# Patient Record
Sex: Male | Born: 1965 | Race: Black or African American | Hispanic: No | Marital: Married | Smoking: Never smoker
Health system: Southern US, Community
[De-identification: ages and names within clinical notes are randomized; demographics above are authoritative.]

## PROBLEM LIST (undated history)

## (undated) DIAGNOSIS — I10 Essential (primary) hypertension: Secondary | ICD-10-CM

## (undated) DIAGNOSIS — N521 Erectile dysfunction due to diseases classified elsewhere: Secondary | ICD-10-CM

## (undated) DIAGNOSIS — Z8249 Family history of ischemic heart disease and other diseases of the circulatory system: Secondary | ICD-10-CM

## (undated) DIAGNOSIS — F40243 Fear of flying: Secondary | ICD-10-CM

## (undated) DIAGNOSIS — E11319 Type 2 diabetes mellitus with unspecified diabetic retinopathy without macular edema: Secondary | ICD-10-CM

## (undated) DIAGNOSIS — E119 Type 2 diabetes mellitus without complications: Secondary | ICD-10-CM

## (undated) DIAGNOSIS — G4730 Sleep apnea, unspecified: Secondary | ICD-10-CM

## (undated) DIAGNOSIS — I2510 Atherosclerotic heart disease of native coronary artery without angina pectoris: Secondary | ICD-10-CM

## (undated) DIAGNOSIS — E1169 Type 2 diabetes mellitus with other specified complication: Secondary | ICD-10-CM

## (undated) DIAGNOSIS — E785 Hyperlipidemia, unspecified: Secondary | ICD-10-CM

## (undated) DIAGNOSIS — E1165 Type 2 diabetes mellitus with hyperglycemia: Secondary | ICD-10-CM

## (undated) DIAGNOSIS — E1151 Type 2 diabetes mellitus with diabetic peripheral angiopathy without gangrene: Secondary | ICD-10-CM

## (undated) DIAGNOSIS — E291 Testicular hypofunction: Secondary | ICD-10-CM

## (undated) DIAGNOSIS — N529 Male erectile dysfunction, unspecified: Secondary | ICD-10-CM

## (undated) DIAGNOSIS — M6208 Separation of muscle (nontraumatic), other site: Secondary | ICD-10-CM

## (undated) HISTORY — DX: Sleep apnea, unspecified: G47.30

## (undated) HISTORY — DX: Type 2 diabetes mellitus with diabetic peripheral angiopathy without gangrene: E11.51

## (undated) HISTORY — DX: Morbid (severe) obesity due to excess calories: E66.01

## (undated) HISTORY — DX: Atherosclerotic heart disease of native coronary artery without angina pectoris: I25.10

## (undated) HISTORY — DX: Separation of muscle (nontraumatic), other site: M62.08

## (undated) HISTORY — DX: Testicular hypofunction: E29.1

## (undated) HISTORY — DX: Essential (primary) hypertension: I10

## (undated) HISTORY — DX: Male erectile dysfunction, unspecified: N52.9

## (undated) HISTORY — DX: Type 2 diabetes mellitus with hyperglycemia: E11.65

## (undated) HISTORY — DX: Type 2 diabetes mellitus with unspecified diabetic retinopathy without macular edema: E11.319

## (undated) HISTORY — DX: Type 2 diabetes mellitus without complications: E11.9

## (undated) HISTORY — DX: Fear of flying: F40.243

## (undated) HISTORY — DX: Family history of ischemic heart disease and other diseases of the circulatory system: Z82.49

## (undated) HISTORY — DX: Type 2 diabetes mellitus with other specified complication: E11.69

## (undated) HISTORY — DX: Erectile dysfunction due to diseases classified elsewhere: N52.1

## (undated) HISTORY — DX: Hyperlipidemia, unspecified: E78.5

---

## 2011-12-28 HISTORY — PX: COLONOSCOPY: SHX174

## 2015-02-20 DIAGNOSIS — E1169 Type 2 diabetes mellitus with other specified complication: Secondary | ICD-10-CM | POA: Insufficient documentation

## 2015-02-20 DIAGNOSIS — N529 Male erectile dysfunction, unspecified: Secondary | ICD-10-CM | POA: Insufficient documentation

## 2015-02-20 DIAGNOSIS — E66813 Obesity, class 3: Secondary | ICD-10-CM

## 2015-02-20 DIAGNOSIS — Z6841 Body Mass Index (BMI) 40.0 and over, adult: Secondary | ICD-10-CM

## 2015-02-20 DIAGNOSIS — N521 Erectile dysfunction due to diseases classified elsewhere: Secondary | ICD-10-CM

## 2015-02-20 HISTORY — DX: Obesity, class 3: E66.813

## 2015-02-20 HISTORY — DX: Type 2 diabetes mellitus with other specified complication: N52.1

## 2015-02-20 HISTORY — DX: Morbid (severe) obesity due to excess calories: E66.01

## 2015-02-20 HISTORY — DX: Type 2 diabetes mellitus with other specified complication: E11.69

## 2015-03-12 ENCOUNTER — Encounter: Payer: Self-pay | Admitting: Podiatry

## 2015-03-12 ENCOUNTER — Ambulatory Visit (INDEPENDENT_AMBULATORY_CARE_PROVIDER_SITE_OTHER): Payer: BLUE CROSS/BLUE SHIELD | Admitting: Podiatry

## 2015-03-12 ENCOUNTER — Ambulatory Visit (INDEPENDENT_AMBULATORY_CARE_PROVIDER_SITE_OTHER): Payer: BLUE CROSS/BLUE SHIELD

## 2015-03-12 VITALS — BP 146/90 | HR 91 | Resp 16 | Ht 70.0 in | Wt 285.0 lb

## 2015-03-12 DIAGNOSIS — L600 Ingrowing nail: Secondary | ICD-10-CM | POA: Diagnosis not present

## 2015-03-12 DIAGNOSIS — M722 Plantar fascial fibromatosis: Secondary | ICD-10-CM

## 2015-03-12 DIAGNOSIS — E119 Type 2 diabetes mellitus without complications: Secondary | ICD-10-CM

## 2015-03-12 MED ORDER — NEOMYCIN-POLYMYXIN-HC 3.5-10000-1 OT SOLN: OTIC | Status: DC

## 2015-03-12 MED ORDER — MELOXICAM 15 MG PO TABS: 15.0000 mg | ORAL_TABLET | Freq: Every day | ORAL | Status: DC

## 2015-03-12 NOTE — Patient Instructions (Addendum)
Plantar Fasciitis (Heel Spur Syndrome) with Rehab The plantar fascia is a fibrous, ligament-like, soft-tissue structure that spans the bottom of the foot. Plantar fasciitis is a condition that causes pain in the foot due to inflammation of the tissue. SYMPTOMS   Pain and tenderness on the underneath side of the foot.  Pain that worsens with standing or walking. CAUSES  Plantar fasciitis is caused by irritation and injury to the plantar fascia on the underneath side of the foot. Common mechanisms of injury include:  Direct trauma to bottom of the foot.  Damage to a small nerve that runs under the foot where the main fascia attaches to the heel bone.  Stress placed on the plantar fascia due to bone spurs. RISK INCREASES WITH:   Activities that place stress on the plantar fascia (running, jumping, pivoting, or cutting).  Poor strength and flexibility.  Improperly fitted shoes.  Tight calf muscles.  Flat feet.  Failure to warm-up properly before activity.  Obesity. PREVENTION  Warm up and stretch properly before activity.  Allow for adequate recovery between workouts.  Maintain physical fitness:  Strength, flexibility, and endurance.  Cardiovascular fitness.  Maintain a health body weight.  Avoid stress on the plantar fascia.  Wear properly fitted shoes, including arch supports for individuals who have flat feet.  PROGNOSIS  If treated properly, then the symptoms of plantar fasciitis usually resolve without surgery. However, occasionally surgery is necessary.  RELATED COMPLICATIONS   Recurrent symptoms that may result in a chronic condition.  Problems of the lower back that are caused by compensating for the injury, such as limping.  Pain or weakness of the foot during push-off following surgery.  Chronic inflammation, scarring, and partial or complete fascia tear, occurring more often from repeated injections.  TREATMENT  Treatment initially involves the  use of ice and medication to help reduce pain and inflammation. The use of strengthening and stretching exercises may help reduce pain with activity, especially stretches of the Achilles tendon. These exercises may be performed at home or with a therapist. Your caregiver may recommend that you use heel cups of arch supports to help reduce stress on the plantar fascia. Occasionally, corticosteroid injections are given to reduce inflammation. If symptoms persist for greater than 6 months despite non-surgical (conservative), then surgery may be recommended.   MEDICATION   If pain medication is necessary, then nonsteroidal anti-inflammatory medications, such as aspirin and ibuprofen, or other minor pain relievers, such as acetaminophen, are often recommended.  Do not take pain medication within 7 days before surgery.  Prescription pain relievers may be given if deemed necessary by your caregiver. Use only as directed and only as much as you need.  Corticosteroid injections may be given by your caregiver. These injections should be reserved for the most serious cases, because they may only be given a certain number of times.  HEAT AND COLD  Cold treatment (icing) relieves pain and reduces inflammation. Cold treatment should be applied for 10 to 15 minutes every 2 to 3 hours for inflammation and pain and immediately after any activity that aggravates your symptoms. Use ice packs or massage the area with a piece of ice (ice massage).  Heat treatment may be used prior to performing the stretching and strengthening activities prescribed by your caregiver, physical therapist, or athletic trainer. Use a heat pack or soak the injury in warm water.  SEEK IMMEDIATE MEDICAL CARE IF:  Treatment seems to offer no benefit, or the condition worsens.  Any medications   produce adverse side effects.  EXERCISES- RANGE OF MOTION (ROM) AND STRETCHING EXERCISES - Plantar Fasciitis (Heel Spur Syndrome) These exercises  may help you when beginning to rehabilitate your injury. Your symptoms may resolve with or without further involvement from your physician, physical therapist or athletic trainer. While completing these exercises, remember:   Restoring tissue flexibility helps normal motion to return to the joints. This allows healthier, less painful movement and activity.  An effective stretch should be held for at least 30 seconds.  A stretch should never be painful. You should only feel a gentle lengthening or release in the stretched tissue.  RANGE OF MOTION - Toe Extension, Flexion  Sit with your right / left leg crossed over your opposite knee.  Grasp your toes and gently pull them back toward the top of your foot. You should feel a stretch on the bottom of your toes and/or foot.  Hold this stretch for 10 seconds.  Now, gently pull your toes toward the bottom of your foot. You should feel a stretch on the top of your toes and or foot.  Hold this stretch for 10 seconds. Repeat  times. Complete this stretch 3 times per day.   RANGE OF MOTION - Ankle Dorsiflexion, Active Assisted  Remove shoes and sit on a chair that is preferably not on a carpeted surface.  Place right / left foot under knee. Extend your opposite leg for support.  Keeping your heel down, slide your right / left foot back toward the chair until you feel a stretch at your ankle or calf. If you do not feel a stretch, slide your bottom forward to the edge of the chair, while still keeping your heel down.  Hold this stretch for 10 seconds. Repeat 3 times. Complete this stretch 2 times per day.   STRETCH  Gastroc, Standing  Place hands on wall.  Extend right / left leg, keeping the front knee somewhat bent.  Slightly point your toes inward on your back foot.  Keeping your right / left heel on the floor and your knee straight, shift your weight toward the wall, not allowing your back to arch.  You should feel a gentle stretch  in the right / left calf. Hold this position for 10 seconds. Repeat 3 times. Complete this stretch 2 times per day.  STRETCH  Soleus, Standing  Place hands on wall.  Extend right / left leg, keeping the other knee somewhat bent.  Slightly point your toes inward on your back foot.  Keep your right / left heel on the floor, bend your back knee, and slightly shift your weight over the back leg so that you feel a gentle stretch deep in your back calf.  Hold this position for 10 seconds. Repeat 3 times. Complete this stretch 2 times per day.  STRETCH  Gastrocsoleus, Standing  Note: This exercise can place a lot of stress on your foot and ankle. Please complete this exercise only if specifically instructed by your caregiver.   Place the ball of your right / left foot on a step, keeping your other foot firmly on the same step.  Hold on to the wall or a rail for balance.  Slowly lift your other foot, allowing your body weight to press your heel down over the edge of the step.  You should feel a stretch in your right / left calf.  Hold this position for 10 seconds.  Repeat this exercise with a slight bend in your right /   left knee. Repeat 3 times. Complete this stretch 2 times per day.   STRENGTHENING EXERCISES - Plantar Fasciitis (Heel Spur Syndrome)  These exercises may help you when beginning to rehabilitate your injury. They may resolve your symptoms with or without further involvement from your physician, physical therapist or athletic trainer. While completing these exercises, remember:   Muscles can gain both the endurance and the strength needed for everyday activities through controlled exercises.  Complete these exercises as instructed by your physician, physical therapist or athletic trainer. Progress the resistance and repetitions only as guided.  STRENGTH - Towel Curls  Sit in a chair positioned on a non-carpeted surface.  Place your foot on a towel, keeping your heel  on the floor.  Pull the towel toward your heel by only curling your toes. Keep your heel on the floor. Repeat 3 times. Complete this exercise 2 times per day.  STRENGTH - Ankle Inversion  Secure one end of a rubber exercise band/tubing to a fixed object (table, pole). Loop the other end around your foot just before your toes.  Place your fists between your knees. This will focus your strengthening at your ankle.  Slowly, pull your big toe up and in, making sure the band/tubing is positioned to resist the entire motion.  Hold this position for 10 seconds.  Have your muscles resist the band/tubing as it slowly pulls your foot back to the starting position. Repeat 3 times. Complete this exercises 2 times per day.  Document Released: 12/13/2005 Document Revised: 03/06/2012 Document Reviewed: 03/27/2009 [facility] Patient Information 2014 [place], [place]. Diabetes and Foot Care Diabetes may cause you to have problems because of poor blood supply (circulation) to your feet and legs. This may cause the skin on your feet to become thinner, break easier, and heal more slowly. Your skin may become dry, and the skin may peel and crack. You may also have nerve damage in your legs and feet causing decreased feeling in them. You may not notice minor injuries to your feet that could lead to infections or more serious problems. Taking care of your feet is one of the most important things you can do for yourself.  HOME CARE INSTRUCTIONS  Wear shoes at all times, even in the house. Do not go barefoot. Bare feet are easily injured.  Check your feet daily for blisters, cuts, and redness. If you cannot see the bottom of your feet, use a mirror or ask someone for help.  Wash your feet with warm water (do not use hot water) and mild soap. Then pat your feet and the areas between your toes until they are completely dry. Do not soak your feet as this can dry your skin.  Apply a moisturizing lotion or petroleum  jelly (that does not contain alcohol and is unscented) to the skin on your feet and to dry, brittle toenails. Do not apply lotion between your toes.  Trim your toenails straight across. Do not dig under them or around the cuticle. File the edges of your nails with an emery board or nail file.  Do not cut corns or calluses or try to remove them with medicine.  Wear clean socks or stockings every day. Make sure they are not too tight. Do not wear knee-high stockings since they may decrease blood flow to your legs.  Wear shoes that fit properly and have enough cushioning. To break in new shoes, wear them for just a few hours a day. This prevents you from injuring your  feet. Always look in your shoes before you put them on to be sure there are no objects inside.  Do not cross your legs. This may decrease the blood flow to your feet.  If you find a minor scrape, cut, or break in the skin on your feet, keep it and the skin around it clean and dry. These areas may be cleansed with mild soap and water. Do not cleanse the area with peroxide, alcohol, or iodine.  When you remove an adhesive bandage, be sure not to damage the skin around it.  If you have a wound, look at it several times a day to make sure it is healing.  Do not use heating pads or hot water bottles. They may burn your skin. If you have lost feeling in your feet or legs, you may not know it is happening until it is too late.  Make sure your health care provider performs a complete foot exam at least annually or more often if you have foot problems. Report any cuts, sores, or bruises to your health care provider immediately. SEEK MEDICAL CARE IF:   You have an injury that is not healing.  You have cuts or breaks in the skin.  You have an ingrown nail.  You notice redness on your legs or feet.  You feel burning or tingling in your legs or feet.  You have pain or cramps in your legs and feet.  Your legs or feet are numb.  Your  feet always feel cold. SEEK IMMEDIATE MEDICAL CARE IF:   There is increasing redness, swelling, or pain in or around a wound.  There is a red line that goes up your leg.  Pus is coming from a wound.  You develop a fever or as directed by your health care provider.  You notice a bad smell coming from an ulcer or wound. Document Released: 12/10/2000 Document Revised: 08/15/2013 Document Reviewed: 05/22/2013 [facility] Patient Information 2015 [place], [place]. This information is not intended to replace advice given to you by your health care provider. Make sure you discuss any questions you have with your health care provider.  Betadine Soak Instructions  Purchase an 8 oz. bottle of BETADINE solution (Povidone)  THE DAY AFTER THE PROCEDURE  Place 1 tablespoon of betadine solution in a quart of warm tap water.  Submerge your foot or feet with outer bandage intact for the initial soak; this will allow the bandage to become moist and wet for easy lift off.  Once you remove your bandage, continue to soak in the solution for 20 minutes.  This soak should be done twice a day.  Next, remove your foot or feet from solution, blot dry the affected area and cover.  You may use a band aid large enough to cover the area or use gauze and tape.  Apply other medications to the area as directed by the doctor such as cortisporin otic solution (ear drops) or neosporin.  IF YOUR SKIN BECOMES IRRITATED WHILE USING THESE INSTRUCTIONS, IT IS OKAY TO SWITCH TO EPSOM SALTS AND WATER OR WHITE VINEGAR AND WATER.   [place] Instructions-Post Nail Surgery  You have had your ingrown toenail and root treated with a chemical.  This chemical causes a burn that will drain and ooze like a blister.  This can drain for 6-8 weeks or longer.  It is important to keep this area clean, covered, and follow the soaking instructions dispensed at the time of your surgery.  This area will eventually dry and form a scab.  Once the  scab forms you no longer need to soak or apply a dressing.  If at any time you experience an increase in pain, redness, swelling, or drainage, you should contact the office as soon as possible.

## 2015-03-12 NOTE — Progress Notes (Signed)
   Subjective:    Patient ID: Clarence Black, male    DOB: 10-11-1966, 49 y.o.   MRN: [id number]  HPI right heel pain , been dealing with it for about a month , pain in the bottom of the heel and goes up the arch . Going up the leg. I am diabetic , last a1c 7.0     Review of Systems  Endocrine:       Diabetes   All other systems reviewed and are negative.      Objective:   Physical Exam: I have reviewed his past medical history medications allergies surgery social history and review of systems. Pulses are strongly palpable bilateral. Neurologic sensorium is intact per Semmes-Weinstein and vibratory sensations. Deep tendon reflexes are intact bilateral and muscle strength +5 over 5 dorsiflexion plantar flexors and inverters and everters all intrinsic musculature is intact. Orthopedic evaluation Mr. is all joints distal to the ankle level range of motion without crepitation. That he does have pain on palpation medial calcaneal tubercle of his right heel. Radiographs confirm soft tissue increase in density at the plantar fascial cranial insertion site right heel. Otherwise he has a sharp incurvated nail margin along the tibial and fibular borders of the hallux nail left. This hallux nail does demonstrate some signs of dermatophytes as it does a nail dystrophy.        Assessment & Plan:  Assessment: Plantar fasciitis right heel. Ingrown nail paronychia abscess hallux left. Diabetes mellitus  Plan: We discussed the etiology pathology conservative versus surgical therapies. I injected his right heel today with Kenalog and local anesthetic treatment a plantar fascial strap to be followed by plantar fascial brace in the evening. Started him on meloxicam 15 mg 1 by mouth daily. We also performed a chemical matrixectomy to the tibial and fibular border of the hallux left. He tolerated this well after local anesthesia was administered. He was given both oral and written instructions for the care of his  toe as he will be soaking it twice daily and Betadine water and will apply Cortisporin otic for which a prescription was provided. I will follow-up with him in 1 week.

## 2015-03-19 ENCOUNTER — Ambulatory Visit (INDEPENDENT_AMBULATORY_CARE_PROVIDER_SITE_OTHER): Payer: BLUE CROSS/BLUE SHIELD | Admitting: Podiatry

## 2015-03-19 DIAGNOSIS — L600 Ingrowing nail: Secondary | ICD-10-CM

## 2015-03-20 NOTE — Progress Notes (Signed)
He presents today for follow-up of matrixectomy tibial border hallux left. He's been soaking in Epsom salts and warm water and applying Cortisporin Otic twice daily.  Objective: Vital signs are stable he is alert and oriented 3. Pulses are palpable left foot. Matrixectomy appears to be healing well. There is no erythema no cellulitis drainage or odor.  Assessment: Well-healing surgical toe hallux left.  Plan: Continue to soak in Epsom salts and warm water until completely healed watch for signs and symptoms of infection should there be any he will notify us immediately.

## 2015-04-08 ENCOUNTER — Other Ambulatory Visit: Payer: Self-pay | Admitting: *Deleted

## 2015-04-08 MED ORDER — MELOXICAM 15 MG PO TABS: 15.0000 mg | ORAL_TABLET | Freq: Every day | ORAL | Status: DC

## 2015-04-08 NOTE — Telephone Encounter (Signed)
Sent 90 day fill on Meloxicam for initial Rx to pharmacy

## 2015-04-16 ENCOUNTER — Ambulatory Visit: Payer: BLUE CROSS/BLUE SHIELD | Admitting: Podiatry

## 2015-05-13 ENCOUNTER — Encounter: Payer: Self-pay | Admitting: *Deleted

## 2015-05-27 LAB — LIPID PANEL
Cholesterol: 175 mg/dL (ref 0–200)
HDL: 46 mg/dL (ref 35–70)
LDL Cholesterol: 85 mg/dL
Triglycerides: 221 mg/dL — AB (ref 40–160)

## 2015-05-27 LAB — HEMOGLOBIN A1C: Hgb A1c MFr Bld: 10.3 % — AB (ref 4.0–6.0)

## 2015-05-27 LAB — PSA

## 2015-05-27 LAB — BASIC METABOLIC PANEL: Creatinine: 0.8 mg/dL (ref <=1.3)

## 2015-05-28 ENCOUNTER — Telehealth: Payer: Self-pay | Admitting: Family Medicine

## 2015-05-28 NOTE — Telephone Encounter (Signed)
I added phone number for you to contact the patient.

## 2015-05-28 NOTE — Telephone Encounter (Signed)
Today @ 9:10am  Per the request of Dr. Bobetta Lime, I contacted this patient to review the results from his recent labs. After he verified his date of birth labs were reviewed. Patient was encouraged to continue with the regimen that was discuss during his office visit on 05/27/15. Patient was also encouraged to continue with a mindful diet along with incorporating regular exercise. Patient agreed and stated he will see [place] in 2 weeks.  Kristeen Miss

## 2015-05-28 NOTE — Telephone Encounter (Signed)
This patient's most recent lab results have been reviewed and are within normal ranges including:  Blood counts Liver function Kidney function Thyroid function PSA Electrolytes  ABNORMAL: Cholsterol showed high triglycerides but he was not fasting during lab draw so this is okay.  Hba1c was 10.3% which is higher than the 7.9% he was previously (patient verbal report). We have already changed his medications around during our office visit 05/27/15, continue new regimen.  Continue mindful diet consisting of a low fat, low carb, low sugar diet along with regular exercise.   A copy of the results can be provided to the patient if they would like.

## 2015-05-30 ENCOUNTER — Ambulatory Visit: Payer: Self-pay | Admitting: Urology

## 2015-06-13 ENCOUNTER — Encounter: Payer: Self-pay | Admitting: Family Medicine

## 2015-06-13 ENCOUNTER — Other Ambulatory Visit: Payer: Self-pay | Admitting: Family Medicine

## 2015-06-13 ENCOUNTER — Ambulatory Visit (INDEPENDENT_AMBULATORY_CARE_PROVIDER_SITE_OTHER): Payer: BLUE CROSS/BLUE SHIELD | Admitting: Family Medicine

## 2015-06-13 VITALS — BP 144/82 | HR 103 | Temp 97.4°F | Resp 18 | Ht 69.0 in | Wt 297.5 lb

## 2015-06-13 DIAGNOSIS — E785 Hyperlipidemia, unspecified: Secondary | ICD-10-CM | POA: Diagnosis not present

## 2015-06-13 DIAGNOSIS — IMO0002 Reserved for concepts with insufficient information to code with codable children: Secondary | ICD-10-CM

## 2015-06-13 DIAGNOSIS — E1169 Type 2 diabetes mellitus with other specified complication: Secondary | ICD-10-CM

## 2015-06-13 DIAGNOSIS — E1159 Type 2 diabetes mellitus with other circulatory complications: Secondary | ICD-10-CM

## 2015-06-13 DIAGNOSIS — I10 Essential (primary) hypertension: Secondary | ICD-10-CM

## 2015-06-13 DIAGNOSIS — L0293 Carbuncle, unspecified: Secondary | ICD-10-CM | POA: Diagnosis not present

## 2015-06-13 DIAGNOSIS — E1165 Type 2 diabetes mellitus with hyperglycemia: Secondary | ICD-10-CM

## 2015-06-13 DIAGNOSIS — N521 Erectile dysfunction due to diseases classified elsewhere: Secondary | ICD-10-CM

## 2015-06-13 DIAGNOSIS — E1151 Type 2 diabetes mellitus with diabetic peripheral angiopathy without gangrene: Secondary | ICD-10-CM

## 2015-06-13 DIAGNOSIS — L0292 Furuncle, unspecified: Secondary | ICD-10-CM

## 2015-06-13 HISTORY — DX: Type 2 diabetes mellitus with diabetic peripheral angiopathy without gangrene: E11.51

## 2015-06-13 HISTORY — DX: Essential (primary) hypertension: I10

## 2015-06-13 HISTORY — DX: Hyperlipidemia, unspecified: E78.5

## 2015-06-13 HISTORY — DX: Reserved for concepts with insufficient information to code with codable children: IMO0002

## 2015-06-13 MED ORDER — SIMVASTATIN 40 MG PO TABS: 40.0000 mg | ORAL_TABLET | Freq: Every day | ORAL | Status: DC

## 2015-06-13 MED ORDER — CANAGLIFLOZIN-METFORMIN HCL 150-1000 MG PO TABS: 150.0000 mg | ORAL_TABLET | Freq: Two times a day (BID) | ORAL | Status: DC

## 2015-06-13 MED ORDER — AMLODIPINE BESYLATE 5 MG PO TABS: 5.0000 mg | ORAL_TABLET | Freq: Every day | ORAL | Status: DC

## 2015-06-13 MED ORDER — HYDROCHLOROTHIAZIDE 25 MG PO TABS: 25.0000 mg | ORAL_TABLET | Freq: Every day | ORAL | Status: DC

## 2015-06-13 MED ORDER — TRADJENTA 5 MG PO TABS: 5.0000 mg | ORAL_TABLET | Freq: Every day | ORAL | Status: DC

## 2015-06-13 MED ORDER — TADALAFIL 5 MG PO TABS: 5.0000 mg | ORAL_TABLET | Freq: Every day | ORAL | Status: DC | PRN

## 2015-06-13 MED ORDER — LISINOPRIL 40 MG PO TABS: 40.0000 mg | ORAL_TABLET | Freq: Every day | ORAL | Status: DC

## 2015-06-13 NOTE — Patient Instructions (Signed)
DASH Eating Plan °DASH stands for "Dietary Approaches to Stop Hypertension." The DASH eating plan is a healthy eating plan that has been shown to reduce high blood pressure (hypertension). Additional health benefits may include reducing the risk of type 2 diabetes mellitus, heart disease, and stroke. The DASH eating plan may also help with weight loss. °WHAT DO I NEED TO KNOW ABOUT THE DASH EATING PLAN? °For the DASH eating plan, you will follow these general guidelines: °· Choose foods with a percent daily value for sodium of less than 5% (as listed on the food label). °· Use salt-free seasonings or herbs instead of table salt or sea salt. °· Check with your health care provider or pharmacist before using salt substitutes. °· Eat lower-sodium products, often labeled as "lower sodium" or "no salt added." °· Eat fresh foods. °· Eat more vegetables, fruits, and low-fat dairy products. °· Choose whole grains. Look for the word "whole" as the first word in the ingredient list. °· Choose fish and skinless chicken or turkey more often than red meat. Limit fish, poultry, and meat to 6 oz (170 g) each day. °· Limit sweets, desserts, sugars, and sugary drinks. °· Choose heart-healthy fats. °· Limit cheese to 1 oz (28 g) per day. °· Eat more home-cooked food and less restaurant, buffet, and fast food. °· Limit fried foods. °· Cook foods using methods other than frying. °· Limit canned vegetables. If you do use them, rinse them well to decrease the sodium. °· When eating at a restaurant, ask that your food be prepared with less salt, or no salt if possible. °WHAT FOODS CAN I EAT? °Seek help from a dietitian for individual calorie needs. °Grains °Whole grain or whole wheat bread. Brown rice. Whole grain or whole wheat pasta. Quinoa, bulgur, and whole grain cereals. Low-sodium cereals. Corn or whole wheat flour tortillas. Whole grain cornbread. Whole grain crackers. Low-sodium crackers. °Vegetables °Fresh or frozen vegetables  (raw, steamed, roasted, or grilled). Low-sodium or reduced-sodium tomato and vegetable juices. Low-sodium or reduced-sodium tomato sauce and paste. Low-sodium or reduced-sodium canned vegetables.  °Fruits °All fresh, canned (in natural juice), or frozen fruits. °Meat and Other Protein Products °Ground beef (85% or leaner), grass-fed beef, or beef trimmed of fat. Skinless chicken or turkey. Ground chicken or turkey. Pork trimmed of fat. All fish and seafood. Eggs. Dried beans, peas, or lentils. Unsalted nuts and seeds. Unsalted canned beans. °Dairy °Low-fat dairy products, such as skim or 1% milk, 2% or reduced-fat cheeses, low-fat ricotta or cottage cheese, or plain low-fat yogurt. Low-sodium or reduced-sodium cheeses. °Fats and Oils °Tub margarines without trans fats. Light or reduced-fat mayonnaise and salad dressings (reduced sodium). Avocado. Safflower, olive, or canola oils. Natural peanut or almond butter. °Other °Unsalted popcorn and pretzels. °The items listed above may not be a complete list of recommended foods or beverages. Contact your dietitian for more options. °WHAT FOODS ARE NOT RECOMMENDED? °Grains °White bread. White pasta. White rice. Refined cornbread. Bagels and croissants. Crackers that contain trans fat. °Vegetables °Creamed or fried vegetables. Vegetables in a cheese sauce. Regular canned vegetables. Regular canned tomato sauce and paste. Regular tomato and vegetable juices. °Fruits °Dried fruits. Canned fruit in light or heavy syrup. Fruit juice. °Meat and Other Protein Products °Fatty cuts of meat. Ribs, chicken wings, bacon, sausage, bologna, salami, chitterlings, fatback, hot dogs, bratwurst, and packaged luncheon meats. Salted nuts and seeds. Canned beans with salt. °Dairy °Whole or 2% milk, cream, half-and-half, and cream cheese. Whole-fat or sweetened yogurt. Full-fat   cheeses or blue cheese. Nondairy creamers and whipped toppings. Processed cheese, cheese spreads, or cheese  curds. °Condiments °Onion and garlic salt, seasoned salt, table salt, and sea salt. Canned and packaged gravies. Worcestershire sauce. Tartar sauce. Barbecue sauce. Teriyaki sauce. Soy sauce, including reduced sodium. Steak sauce. Fish sauce. Oyster sauce. Cocktail sauce. Horseradish. Ketchup and mustard. Meat flavorings and tenderizers. Bouillon cubes. Hot sauce. Tabasco sauce. Marinades. Taco seasonings. Relishes. °Fats and Oils °Butter, stick margarine, lard, shortening, ghee, and bacon fat. Coconut, palm kernel, or palm oils. Regular salad dressings. °Other °Pickles and olives. Salted popcorn and pretzels. °The items listed above may not be a complete list of foods and beverages to avoid. Contact your dietitian for more information. °WHERE CAN I FIND MORE INFORMATION? °[employer], [employer], [employer]: www.nhlbi.nih.gov/health/health-topics/topics/dash/ °Document Released: 12/02/2011 Document Revised: 04/29/2014 Document Reviewed: 10/17/2013 °[employer]® Patient Information ©2015 [employer]. This information is not intended to replace advice given to you by your health care provider. Make sure you discuss any questions you have with your health care provider. ° °

## 2015-06-13 NOTE — Progress Notes (Signed)
Name: Clarence Black   MRN: [id number]    DOB: [birth year]   Date:06/13/2015       Progress Note  Subjective  Chief Complaint  Chief Complaint  Patient presents with  . Hypertension    2 week follow up    HPI  Patient is here for routine follow up of Hypertension. First diagnosed with hypertension several years ago. Current anti-hypertension medication regimen includes dietary modification, weight management and Amlodipine 5mg , HCTZ 25mg , Lisinopril 40mg . Patient is following physician recommended management. Not checking blood pressure outside of physician office. Associated symptoms do not include headache, dizziness, nausea, lower extremity swelling, shortness of breath, chest pain, numbness. Associated with erectile dysfunction. Cialis was too expensive but would like to try other alternatives.   Rash: Intermittent issues with rash/boil at the back of his head comes and goes on their own. Can be painful and warm to the touch. Now resolved.  Needs 90 day supply refill of all medications.  Past Medical History  Diagnosis Date  . Diabetes mellitus without complication   . Hypertension   . High cholesterol   . Obesity   . Erectile dysfunction   . Hypogonadism, male     History reviewed. No pertinent past surgical history.  Family History  Problem Relation Age of Onset  . Diabetes Mother   . Kidney disease Mother     History   Social History  . Marital Status: Married    Spouse Name: N/A  . Number of Children: N/A  . Years of Education: N/A   Occupational History  . Not on file.   Social History Main Topics  . Smoking status: Never Smoker   . Smokeless tobacco: Not on file  . Alcohol Use: No  . Drug Use: Not on file  . Sexual Activity:    Partners: Female   Other Topics Concern  . Not on file   Social History Narrative     Current outpatient prescriptions:  .  amLODipine (NORVASC) 5 MG tablet, Take 1 tablet by mouth daily., Disp: , Rfl:  .   Canagliflozin-Metformin HCl (INVOKAMET) [phone] MG TABS, Take 150-1,000 mg by mouth 2 (two) times daily., Disp: , Rfl:  .  hydrochlorothiazide (HYDRODIURIL) 25 MG tablet, Take 1 tablet by mouth daily., Disp: , Rfl:  .  lisinopril (PRINIVIL,ZESTRIL) 40 MG tablet, Take 1 tablet by mouth daily., Disp: , Rfl:  .  meloxicam (MOBIC) 15 MG tablet, Take 1 tablet (15 mg total) by mouth daily., Disp: 90 tablet, Rfl: 0 .  simvastatin (ZOCOR) 40 MG tablet, Take 40 mg by mouth at bedtime., Disp: , Rfl: 3 .  TRADJENTA 5 MG TABS tablet, Take 5 mg by mouth daily., Disp: , Rfl: 1 .  CIALIS 10 MG tablet, Take 1 tablet by mouth as needed., Disp: , Rfl:  .  lisinopril-hydrochlorothiazide (PRINZIDE,ZESTORETIC) 20-25 MG per tablet, TAKE 1 TABLET BY MOUTH ONCE DAILY., Disp: , Rfl:   No Known Allergies   ROS  CONSTITUTIONAL: No significant weight changes, fever, chills, weakness or fatigue.  HEENT:  - Eyes: No visual changes.  - Ears: No auditory changes. No pain.  - Nose: No sneezing, congestion, runny nose. - Throat: No sore throat. No changes in swallowing. SKIN: Positive rash. CARDIOVASCULAR: No chest pain, chest pressure or chest discomfort. No palpitations or edema.  RESPIRATORY: No shortness of breath, cough or sputum.  GASTROINTESTINAL: No anorexia, nausea, vomiting. No changes in bowel habits. No abdominal pain or blood.  GENITOURINARY: No dysuria. No frequency.  No discharge. Positive for erectile dysfunction.   NEUROLOGICAL: No headache, dizziness, syncope, paralysis, ataxia, numbness or tingling in the extremities. No memory changes. No change in bowel or bladder control.  MUSCULOSKELETAL: No joint pain. No muscle pain. HEMATOLOGIC: No anemia, bleeding or bruising.  LYMPHATICS: No enlarged lymph nodes.  PSYCHIATRIC: No change in mood. No change in sleep pattern.  ENDOCRINOLOGIC: No reports of sweating, cold or heat intolerance. No polyuria or polydipsia.   Objective  Filed Vitals:   06/13/15  1429  BP: 144/82  Pulse: 103  Temp: 97.4 F (36.3 C)  TempSrc: Oral  Resp: 18  Height: 5\' 9"  (1.753 m)  Weight: 297 lb 8 oz (134.945 kg)  SpO2: 97%    Physical Exam  Constitutional: Patient appears obese and well-nourished. In no distress.  HEENT:  - Head: Normocephalic and atraumatic. Healed boils at back of scalp. - Ears: Bilateral TMs gray, no erythema or effusion - Nose: Nasal mucosa moist - Mouth/Throat: Oropharynx is clear and moist. No tonsillar hypertrophy or erythema. No post nasal drainage.  - Eyes: Conjunctivae clear, EOM movements normal. PERRLA. No scleral icterus.  Neck: Normal range of motion. Neck supple. No JVD present. No thyromegaly present.  Cardiovascular: Normal rate, regular rhythm and normal heart sounds.  No murmur heard.  Pulmonary/Chest: Effort normal and breath sounds normal. No respiratory distress. Musculoskeletal: Normal range of motion bilateral UE and LE, no joint effusions. Peripheral vascular: Bilateral LE no edema. Neurological: CN II-XII grossly intact with no focal deficits. Alert and oriented to person, place, and time. Coordination, balance, strength, speech and gait are normal.  Skin: Skin is warm and dry. No rash noted. No erythema.  Psychiatric: Patient has a normal mood and affect. Behavior is normal in office today. Judgment and thought content normal in office today.   Assessment & Plan  1. Hypertension goal BP (blood pressure) < 140/90 Improved but not quite at goal. Work on diet and weight loss.  - amLODipine (NORVASC) 5 MG tablet; Take 1 tablet (5 mg total) by mouth daily.  Dispense: 90 tablet; Refill: 1 - hydrochlorothiazide (HYDRODIURIL) 25 MG tablet; Take 1 tablet (25 mg total) by mouth daily.  Dispense: 90 tablet; Refill: 1 - lisinopril (PRINIVIL,ZESTRIL) 40 MG tablet; Take 1 tablet (40 mg total) by mouth daily.  Dispense: 90 tablet; Refill: 1  2. Erectile dysfunction associated with type 2 diabetes mellitus Changed from  brand Cialis to tadalafil lower dose, per our EMR this is a tier 2 drug.  - tadalafil (CIALIS) 5 MG tablet; Take 1 tablet (5 mg total) by mouth daily as needed for erectile dysfunction.  Dispense: 10 tablet; Refill: 2  3. Recurrent boils Should improve with control of blood glucose. Discussed treatment and management if reoccurs.   4. Hyperlipidemia LDL goal <100 Refilled for 90 day supply  - simvastatin (ZOCOR) 40 MG tablet; Take 1 tablet (40 mg total) by mouth at bedtime.  Dispense: 90 tablet; Refill: 1  5. Uncontrolled type 2 diabetes mellitus with peripheral circulatory disorder Refilled for 90 day supply.  - Canagliflozin-Metformin HCl (INVOKAMET) [phone] MG TABS; Take 150-1,000 mg by mouth 2 (two) times daily.  Dispense: 180 tablet; Refill: 1 - TRADJENTA 5 MG TABS tablet; Take 1 tablet (5 mg total) by mouth daily.  Dispense: 90 tablet; Refill: 1

## 2015-07-11 ENCOUNTER — Other Ambulatory Visit: Payer: Self-pay | Admitting: Podiatry

## 2015-07-11 NOTE — Telephone Encounter (Signed)
Pt needs an appt prior to future refills. 

## 2015-08-04 ENCOUNTER — Ambulatory Visit: Payer: BLUE CROSS/BLUE SHIELD | Admitting: Podiatry

## 2015-08-05 ENCOUNTER — Other Ambulatory Visit: Payer: Self-pay | Admitting: Urology

## 2015-08-05 ENCOUNTER — Telehealth: Payer: Self-pay | Admitting: Family Medicine

## 2015-08-05 NOTE — Telephone Encounter (Signed)
I spoke with Clarence Black at [facility] regarding patient complaining of myalgia. We discussed simvastatin and amlodipine combination increasing risk for myalgia therefore I verbally ordered atorvastatin 40mg  one tablet qhs, quantity #90, 1 refill and instructed the pharmacist to relay to the patient to discontinue simvastatin 40mg  for 1 week and then start atorvastatin 40mg . If he continues to have myalgia f/u with me in clinic.

## 2015-09-08 ENCOUNTER — Encounter: Payer: BLUE CROSS/BLUE SHIELD | Admitting: Podiatry

## 2015-09-08 NOTE — Progress Notes (Signed)
This encounter was created in error - please disregard.

## 2015-09-12 ENCOUNTER — Encounter: Payer: Self-pay | Admitting: Family Medicine

## 2015-09-12 ENCOUNTER — Ambulatory Visit (INDEPENDENT_AMBULATORY_CARE_PROVIDER_SITE_OTHER): Payer: BLUE CROSS/BLUE SHIELD | Admitting: Family Medicine

## 2015-09-12 VITALS — BP 158/96 | HR 92 | Temp 98.6°F | Resp 18 | Ht 69.0 in | Wt 297.1 lb

## 2015-09-12 DIAGNOSIS — E785 Hyperlipidemia, unspecified: Secondary | ICD-10-CM

## 2015-09-12 DIAGNOSIS — I10 Essential (primary) hypertension: Secondary | ICD-10-CM

## 2015-09-12 DIAGNOSIS — E1151 Type 2 diabetes mellitus with diabetic peripheral angiopathy without gangrene: Secondary | ICD-10-CM

## 2015-09-12 DIAGNOSIS — Z23 Encounter for immunization: Secondary | ICD-10-CM

## 2015-09-12 DIAGNOSIS — E1165 Type 2 diabetes mellitus with hyperglycemia: Principal | ICD-10-CM

## 2015-09-12 DIAGNOSIS — IMO0002 Reserved for concepts with insufficient information to code with codable children: Secondary | ICD-10-CM

## 2015-09-12 DIAGNOSIS — E1159 Type 2 diabetes mellitus with other circulatory complications: Secondary | ICD-10-CM

## 2015-09-12 DIAGNOSIS — E66813 Obesity, class 3: Secondary | ICD-10-CM

## 2015-09-12 LAB — POCT UA - MICROALBUMIN: Microalbumin Ur, POC: 100 mg/L

## 2015-09-12 LAB — POCT GLYCOSYLATED HEMOGLOBIN (HGB A1C): HEMOGLOBIN A1C: 11.8

## 2015-09-12 MED ORDER — SITAGLIPTIN PHOSPHATE 100 MG PO TABS: 100.0000 mg | ORAL_TABLET | Freq: Every day | ORAL | Status: DC

## 2015-09-12 MED ORDER — EMPAGLIFLOZIN 25 MG PO TABS: 25.0000 mg | ORAL_TABLET | Freq: Every day | ORAL | Status: DC

## 2015-09-12 MED ORDER — AMLODIPINE BESYLATE 10 MG PO TABS: 10.0000 mg | ORAL_TABLET | Freq: Every day | ORAL | Status: DC

## 2015-09-12 MED ORDER — AMLODIPINE BESYLATE 10 MG PO TABS: 5.0000 mg | ORAL_TABLET | Freq: Every day | ORAL | Status: DC

## 2015-09-12 MED ORDER — ONETOUCH DELICA LANCETS FINE MISC: 1.0000 | Freq: Three times a day (TID) | Status: AC

## 2015-09-12 MED ORDER — GLUCOSE BLOOD VI STRP: ORAL_STRIP | Status: AC

## 2015-09-12 MED ORDER — ONETOUCH ULTRA 2 W/DEVICE KIT: 1.0000 | PACK | Freq: Three times a day (TID) | Status: DC

## 2015-09-12 MED ORDER — BLOOD PRESSURE MONITOR AUTOMAT DEVI: 1.0000 | Freq: Every day | Status: DC

## 2015-09-12 MED ORDER — METFORMIN HCL 1000 MG PO TABS: 1000.0000 mg | ORAL_TABLET | Freq: Two times a day (BID) | ORAL | Status: DC

## 2015-09-13 NOTE — Progress Notes (Signed)
Name: Clarence Black   MRN: [id number]    DOB: Feb 07, 1966   Date:09/13/2015       Progress Note  Subjective  Chief Complaint  Chief Complaint  Patient presents with  . Diabetes    patient is here for his 15-monthf/u  . Hypertension    HPI  Clarence Begueis a 50year old male here today for routine follow up of Obesity, Diabetes Type II, HLD, HTN. He was first diagnosed with Diabetes Type 2 several years ago in his 341sand has had a hard time with consistent glycemic control due to compliance with diet, lifestyle. He reports consistent use of medications as prescribed however there is some confusion as to what medications he is actually taking at home and what was prescribed. He did not bring his medication bottles with him today. Clarence's wife is here today at his visit and expresses concern that he eats large portions, desserts, non-diabetic friendly foods despite her efforts to create an environment in their home that would facilitate better control of his health issues. They also have a home gym that he does not use. For his obesity he is not exercising or making efforts with his diet. His mother is in her 637sbut due to the same health issues she is very ill and Clarence Black's wife does not want him to go down the same path. Clarence Black appropriately concerned about his health and acknowledges that he needs to make more efforts. Related symptomsinclude fatigue, erectile dysfunction and do not include paresthesia, polydipsia, polyuria, wounds, ulcers and gastroparesis. Last diabetic eye exam is unknown. For his HTN he is taking Amlodipine 5 mg a day, Lisinopril 40 mg one a day, HCTZ 25 mg one a day with no reported chest pain, palpitations, edema. For his HLD he is taking atorvastatin 40 mg with no reported arthralgia or myalgia.   Patient Active Problem List   Diagnosis Date Noted  . Encounter for immunization 09/12/2015  . Uncontrolled type 2 diabetes mellitus with peripheral circulatory  disorder 06/13/2015  . Hyperlipidemia LDL goal <100 06/13/2015  . Hypertension goal BP (blood pressure) < 140/90 06/13/2015  . Recurrent boils 06/13/2015  . Erectile dysfunction associated with type 2 diabetes mellitus 02/20/2015  . Obesity, Class III, BMI 40-49.9 (morbid obesity) 02/20/2015    Social History  Substance Use Topics  . Smoking status: Never Smoker   . Smokeless tobacco: Not on file  . Alcohol Use: No     Current outpatient prescriptions:  .  amLODipine (NORVASC) 10 MG tablet, Take 1 tablet (10 mg total) by mouth daily., Disp: 90 tablet, Rfl: 3 .  atorvastatin (LIPITOR) 40 MG tablet, Take 40 mg by mouth daily., Disp: , Rfl:  .  Blood Glucose Monitoring Suppl (ONE TOUCH ULTRA 2) W/DEVICE KIT, 1 Device by Does not apply route 3 (three) times daily., Disp: 1 each, Rfl: 0 .  Blood Pressure Monitoring (BLOOD PRESSURE MONITOR AUTOMAT) DEVI, 1 Device by Does not apply route daily., Disp: 1 Device, Rfl: 0 .  empagliflozin (JARDIANCE) 25 MG TABS tablet, Take 25 mg by mouth daily., Disp: 90 tablet, Rfl: 2 .  glucose blood (ONE TOUCH ULTRA TEST) test strip, Use as instructed, Disp: 300 each, Rfl: 3 .  hydrochlorothiazide (HYDRODIURIL) 25 MG tablet, Take 1 tablet (25 mg total) by mouth daily., Disp: 90 tablet, Rfl: 1 .  lisinopril (PRINIVIL,ZESTRIL) 40 MG tablet, Take 1 tablet (40 mg total) by mouth daily., Disp: 90 tablet, Rfl: 1 .  meloxicam (  MOBIC) 15 MG tablet, TAKE 1 TABLET (15 MG TOTAL) BY MOUTH DAILY., Disp: 90 tablet, Rfl: 1 .  metFORMIN (GLUCOPHAGE) 1000 MG tablet, Take 1 tablet (1,000 mg total) by mouth 2 (two) times daily with a meal., Disp: 180 tablet, Rfl: 3 .  ONETOUCH DELICA LANCETS FINE MISC, 1 Device by Does not apply route 3 (three) times daily., Disp: 300 each, Rfl: 3 .  sitaGLIPtin (JANUVIA) 100 MG tablet, Take 1 tablet (100 mg total) by mouth daily., Disp: 90 tablet, Rfl: 2 .  tadalafil (CIALIS) 5 MG tablet, Take 1 tablet (5 mg total) by mouth daily as needed for  erectile dysfunction., Disp: 10 tablet, Rfl: 2  History reviewed. No pertinent past surgical history.  Family History  Problem Relation Age of Onset  . Diabetes Mother   . Kidney disease Mother     No Known Allergies   Review of Systems  CONSTITUTIONAL: No significant weight changes, fever, chills, weakness or fatigue.  HEENT:  - Eyes: No visual changes.  - Ears: No auditory changes. No pain.  - Nose: No sneezing, congestion, runny nose. - Throat: No sore throat. No changes in swallowing. SKIN: No rash or itching.  CARDIOVASCULAR: No chest pain, chest pressure or chest discomfort. No palpitations or edema.  RESPIRATORY: No shortness of breath, cough or sputum.  GASTROINTESTINAL: No anorexia, nausea, vomiting. No changes in bowel habits. No abdominal pain or blood.  GENITOURINARY: No dysuria. No frequency. No discharge.  NEUROLOGICAL: No headache, dizziness, syncope, paralysis, ataxia, numbness or tingling in the extremities. No memory changes. No change in bowel or bladder control.  MUSCULOSKELETAL: No joint pain. No muscle pain. HEMATOLOGIC: No anemia, bleeding or bruising.  LYMPHATICS: No enlarged lymph nodes.  PSYCHIATRIC: No change in mood. No change in sleep pattern.  ENDOCRINOLOGIC: No reports of sweating, cold or heat intolerance. No polyuria or polydipsia.     Objective  BP 158/96 mmHg  Pulse 92  Temp(Src) 98.6 F (37 C) (Oral)  Resp 18  Ht _0  (1.753 m)  Wt 297 lb 1.6 oz (134.764 kg)  BMI 43.85 kg/m2  SpO2 97% Body mass index is 43.85 kg/(m^2).  Physical Exam  Constitutional: Patient is obese and well-nourished. In no distress.  HEENT:  - Head: Normocephalic and atraumatic.  - Ears: Bilateral TMs gray, no erythema or effusion - Nose: Nasal mucosa moist - Mouth/Throat: Oropharynx is clear and moist. No tonsillar hypertrophy or erythema. No post nasal drainage.  - Eyes: Conjunctivae clear, EOM movements normal. PERRLA. No scleral icterus.  Neck:  Normal range of motion. Neck supple. No JVD present. No thyromegaly present.  Cardiovascular: Normal rate, regular rhythm and normal heart sounds.  No murmur heard.  Pulmonary/Chest: Effort normal and breath sounds normal. No respiratory distress. Musculoskeletal: Normal range of motion bilateral UE and LE, no joint effusions. Peripheral vascular: Bilateral LE no edema. Neurological: CN II-XII grossly intact with no focal deficits. Alert and oriented to person, place, and time. Coordination, balance, strength, speech and gait are normal.  Skin: Skin is warm and dry. No rash noted. No erythema.  Psychiatric: Patient has a stable mood and affect. Behavior is normal in office today. Judgment and thought content normal in office today.   Recent Results (from the past 2160 hour(s))  POCT glycosylated hemoglobin (Hb A1C)     Status: Abnormal   Collection Time: 09/12/15  2:05 PM  Result Value Ref Range   Hemoglobin A1C 11.8   POCT UA - Microalbumin  Status: Abnormal   Collection Time: 09/12/15  2:06 PM  Result Value Ref Range   Microalbumin Ur, POC 100 mg/L   Creatinine, POC  mg/dL   Albumin/Creatinine Ratio, Urine, POC       Assessment & Plan  1. Uncontrolled type 2 diabetes mellitus with peripheral circulatory disorder Re ordered new medications to include Januvia 100 mg one a day, Jardiance 25 mg one a day, Metformin 1000 mg twice a day. New glucometer and testing supplies ordered. Referral to [facility] for nutritional education.  Keep log of blood glucose and bring to next visit along with all medication bottles. Close follow up to ensure better understanding of treatment and goals.   Patient's Hba1c goal is <7% is acceptable as initial goal.  Encouraged patient to continue efforts on checking blood glucose on a daily basis. Fasting blood glucose control goal is 80-149m/dL and post prandial blood glucose control is <18100mdL.  Reviewed diet, exercise, lifestyle changes and  current medication regimen pertaining to diabetes with the patient.   Reminded patient of the required annual dilated retinal exam.   - POCT glycosylated hemoglobin (Hb A1C) - POCT UA - Microalbumin - sitaGLIPtin (JANUVIA) 100 MG tablet; Take 1 tablet (100 mg total) by mouth daily.  Dispense: 90 tablet; Refill: 2 - metFORMIN (GLUCOPHAGE) 1000 MG tablet; Take 1 tablet (1,000 mg total) by mouth 2 (two) times daily with a meal.  Dispense: 180 tablet; Refill: 3 - empagliflozin (JARDIANCE) 25 MG TABS tablet; Take 25 mg by mouth daily.  Dispense: 90 tablet; Refill: 2 - ONETOUCH DELICA LANCETS FINE MISC; 1 Device by Does not apply route 3 (three) times daily.  Dispense: 300 each; Refill: 3 - Blood Glucose Monitoring Suppl (ONE TOUCH ULTRA 2) W/DEVICE KIT; 1 Device by Does not apply route 3 (three) times daily.  Dispense: 1 each; Refill: 0 - glucose blood (ONE TOUCH ULTRA TEST) test strip; Use as instructed  Dispense: 300 each; Refill: 3 - Ambulatory referral to diabetic education  2. Need for immunization against influenza  - Flu Vaccine Quad 36+ mos IM  3. Hypertension goal BP (blood pressure) < 140/90 Sub optimal control, increased amlodipine from 5 mg to 10 mg today. Provided RX for home BP machine. Educated patient on treatment goals and DASH diet.   - Blood Pressure Monitoring (BLOOD PRESSURE MONITOR AUTOMAT) DEVI; 1 Device by Does not apply route daily.  Dispense: 1 Device; Refill: 0 - Ambulatory referral to diabetic education - amLODipine (NORVASC) 10 MG tablet; Take 1 tablet (10 mg total) by mouth daily.  Dispense: 90 tablet; Refill: 3  4. Hyperlipidemia LDL goal <100 Continue statin therapy.  - Ambulatory referral to diabetic education  5. Obesity, Class III, BMI 40-49.9 (morbid obesity) The patient has been counseled on their higher than normal BMI.  They have verbally expressed understanding their increased risk for other diseases.  In efforts to meet a better target BMI goal the  patient has been counseled on lifestyle, diet and exercise modification tactics. Start with moderate intensity aerobic exercise (walking, jogging, elliptical, swimming, group or individual sports, hiking) at least 3023m a day at least 4 days a week and increase intensity, duration, frequency as tolerated. Diet should include well balance fresh fruits and vegetables avoiding processed foods, carbohydrates and sugars. Drink at least 8oz 10 glasses a day avoiding sodas, sugary fruit drinks, sweetened tea. Check weight on a reliable scale daily and monitor weight loss progress daily. Consider investing in mobile phone apps that will help  keep track of weight loss goals.  - Ambulatory referral to diabetic education

## 2015-09-15 ENCOUNTER — Encounter: Payer: Self-pay | Admitting: Podiatry

## 2015-09-15 ENCOUNTER — Ambulatory Visit (INDEPENDENT_AMBULATORY_CARE_PROVIDER_SITE_OTHER): Payer: BLUE CROSS/BLUE SHIELD | Admitting: Podiatry

## 2015-09-15 VITALS — BP 156/97 | HR 88 | Resp 16

## 2015-09-15 DIAGNOSIS — M722 Plantar fascial fibromatosis: Secondary | ICD-10-CM

## 2015-09-15 DIAGNOSIS — L600 Ingrowing nail: Secondary | ICD-10-CM | POA: Diagnosis not present

## 2015-09-15 MED ORDER — NEOMYCIN-POLYMYXIN-HC 3.5-10000-1 OT SOLN: OTIC | Status: DC

## 2015-09-15 NOTE — Patient Instructions (Signed)

## 2015-09-15 NOTE — Progress Notes (Signed)
He presents today with a chief complaint of an ingrown toenail tibial border hallux right. He states that the plantar fasciitis and the right heels approximately 75% improved. He continues his anti-inflammatory zone regular basis.  Objective: Vital signs are stable alert and oriented 3. Pulses are palpable. Minimal pain on palpation medial calcaneal tubercle of the right heel. Sharp and radial nail margin tibial border of the hallux right. No granulation tissue no purulence no malodor.  Assessment: Ingrown nail paronychia hallux right. Chronic proximal plantar fasciitis right.  Plan: He was scanned for set of orthotics today. Also we performed a chemical matrixectomy to the tibial border of the hallux right today he tolerated this procedure well after local anesthesia was administered. No intraoperative complications were noted. He was given both oral and written home-going instructions for care and soaking of his toe. He also prescribed him up anti-inflammatory and an antibiotic for his surgical site. I will follow-up with him in 1 week. Questions or concerns he will notify S immediately.

## 2015-09-18 DIAGNOSIS — M79673 Pain in unspecified foot: Secondary | ICD-10-CM | POA: Diagnosis not present

## 2015-09-18 DIAGNOSIS — M722 Plantar fascial fibromatosis: Secondary | ICD-10-CM

## 2015-09-22 ENCOUNTER — Ambulatory Visit: Payer: BLUE CROSS/BLUE SHIELD | Admitting: Podiatry

## 2015-09-24 ENCOUNTER — Ambulatory Visit: Payer: BLUE CROSS/BLUE SHIELD | Admitting: Podiatry

## 2015-09-30 ENCOUNTER — Telehealth: Payer: Self-pay | Admitting: Family Medicine

## 2015-09-30 NOTE — Telephone Encounter (Signed)
Please inquire with patient why he has not made efforts to go to nutritional counseling for his diabetes (see message below from [facility]). Have him call and schedule his appointment if he is willing to do so.  Your patient whom you referred for the Diabetes Program has not come as you had requested.  Please discuss this with your patient.  When he/she decides to commit to the program, please let us know.          Reason for missed visit(s);                Called numerous times but patient did not call back. Notes are in the notes section of the referral regarding messages.            Thank you for this referral, and if we can be of further assistance to you or your patient, let us know.                [facility]    Administrative Clinical Assistant    Direct Dial: [phone]  Fax: [phone]     Website: [email or web address]

## 2015-09-30 NOTE — Telephone Encounter (Signed)
Contacted this patient to find out why he has not followed up with the nutritional counseling and he stated that he forgot and was going to call them to reschedule. I strongly encouraged him to contact their office asap so that he could schedule an appt. Their number was given.

## 2015-10-01 ENCOUNTER — Ambulatory Visit (INDEPENDENT_AMBULATORY_CARE_PROVIDER_SITE_OTHER): Payer: BLUE CROSS/BLUE SHIELD | Admitting: Urology

## 2015-10-01 ENCOUNTER — Encounter: Payer: Self-pay | Admitting: Urology

## 2015-10-01 VITALS — BP 118/80 | HR 102 | Ht 70.0 in | Wt 285.6 lb

## 2015-10-01 DIAGNOSIS — E291 Testicular hypofunction: Secondary | ICD-10-CM

## 2015-10-01 DIAGNOSIS — N521 Erectile dysfunction due to diseases classified elsewhere: Secondary | ICD-10-CM | POA: Diagnosis not present

## 2015-10-01 DIAGNOSIS — E1169 Type 2 diabetes mellitus with other specified complication: Secondary | ICD-10-CM | POA: Diagnosis not present

## 2015-10-01 DIAGNOSIS — E119 Type 2 diabetes mellitus without complications: Secondary | ICD-10-CM

## 2015-10-01 HISTORY — DX: Type 2 diabetes mellitus without complications: E11.9

## 2015-10-01 MED ORDER — TESTOSTERONE 4 MG/24HR TD PT24: 1.0000 | MEDICATED_PATCH | Freq: Every day | TRANSDERMAL | Status: DC

## 2015-10-01 NOTE — Progress Notes (Signed)
10/01/2015 2:25 PM   [name] [birth year] [id number]  Referring provider: Bobetta Lime, MD [address] [place], [place] [address]  No chief complaint on file.   HPI: Patient has a dual problem of lack of desire and fatigue along with the erectile dysfunction. Erectile dysfunction is the inability to maintain: Erection. Otherwise he has good voiding. He works for [employer] his major concern is that he loses married she doesn't perform sexually. He was very pleased when he was taking ask her on a wants to retake a testosterone supplements. In addition he did well with daily sildenafil at a low-dose.   PMH: Past Medical History  Diagnosis Date  . Diabetes mellitus without complication ([place])   . Hypertension   . High cholesterol   . Obesity   . Erectile dysfunction   . Hypogonadism, male   . Uncontrolled type 2 diabetes mellitus with peripheral circulatory disorder ([place]) 06/13/2015  . Hypertension goal BP (blood pressure) < 140/90 06/13/2015  . Erectile dysfunction associated with type 2 diabetes mellitus ([place]) 02/20/2015  . Type 2 diabetes mellitus ([place]) 10/01/2015  . Hyperlipidemia LDL goal <100 06/13/2015  . Obesity, Class III, BMI 40-49.9 (morbid obesity) ([place]) 02/20/2015    Surgical History: No past surgical history on file.  Home Medications:    Medication List       This list is accurate as of: 10/01/15  2:25 PM.  Always use your most recent med list.               amLODipine 10 MG tablet  Commonly known as:  NORVASC  Take 1 tablet (10 mg total) by mouth daily.     atorvastatin 40 MG tablet  Commonly known as:  LIPITOR  Take 40 mg by mouth daily.     Blood Pressure Monitor Automat Devi  1 Device by Does not apply route daily.     empagliflozin 25 MG Tabs tablet  Commonly known as:  JARDIANCE  Take 25 mg by mouth daily.     glipiZIDE-metformin 2.5-500 MG tablet  Commonly known as:  METAGLIP  Take 1 tablet by mouth 2 (two)  times daily before a meal.     glucose blood test strip  Commonly known as:  ONE TOUCH ULTRA TEST  Use as instructed     hydrochlorothiazide 25 MG tablet  Commonly known as:  HYDRODIURIL  Take 1 tablet (25 mg total) by mouth daily.     lisinopril 40 MG tablet  Commonly known as:  PRINIVIL,ZESTRIL  Take 1 tablet (40 mg total) by mouth daily.     meloxicam 15 MG tablet  Commonly known as:  MOBIC  TAKE 1 TABLET (15 MG TOTAL) BY MOUTH DAILY.     metFORMIN 1000 MG tablet  Commonly known as:  GLUCOPHAGE  Take 1 tablet (1,000 mg total) by mouth 2 (two) times daily with a meal.     neomycin-polymyxin-hydrocortisone otic solution  Commonly known as:  CORTISPORIN  Apply on to two drops to toe after soaking twice daily.     ONE TOUCH ULTRA 2 W/DEVICE Kit  1 Device by Does not apply route 3 (three) times daily.     ONETOUCH DELICA LANCETS FINE Misc  1 Device by Does not apply route 3 (three) times daily.     sitaGLIPtin 100 MG tablet  Commonly known as:  JANUVIA  Take 1 tablet (100 mg total) by mouth daily.     tadalafil 5 MG tablet  Commonly known as:  CIALIS  Take 1 tablet (5 mg total) by mouth daily as needed for erectile dysfunction.     testosterone 4 MG/24HR Pt24 patch  Commonly known as:  ANDRODERM  Place 1 patch onto the skin daily.     TRADJENTA 5 MG Tabs tablet  Generic drug:  linagliptin        Allergies: No Known Allergies  Family History: Family History  Problem Relation Age of Onset  . Diabetes Mother   . Kidney disease Mother     Social History:  reports that he has never smoked. He does not have any smokeless tobacco history on file. He reports that he does not drink alcohol or use illicit drugs.  ROS: UROLOGY Frequent Urination?: Yes Hard to postpone urination?: No Burning/pain with urination?: No Get up at night to urinate?: Yes Leakage of urine?: No Urine stream starts and stops?: No Trouble starting stream?: No Do you have to strain to  urinate?: No Blood in urine?: No Urinary tract infection?: No Sexually transmitted disease?: No Injury to kidneys or bladder?: No Painful intercourse?: No Weak stream?: No Erection problems?: Yes Penile pain?: No  Gastrointestinal Nausea?: No Vomiting?: No Indigestion/heartburn?: No Diarrhea?: No Constipation?: No  Constitutional Fever: No Night sweats?: Yes Weight loss?: No Fatigue?: No  Skin Skin rash/lesions?: No Itching?: No  Eyes Blurred vision?: No Double vision?: No  Ears/Nose/Throat Sore throat?: No Sinus problems?: No  Hematologic/Lymphatic Swollen glands?: No Easy bruising?: No  Cardiovascular Leg swelling?: No Chest pain?: No  Respiratory Cough?: No Shortness of breath?: No  Endocrine Excessive thirst?: No  Musculoskeletal Back pain?: No Joint pain?: No  Neurological Headaches?: No Dizziness?: No  Psychologic Depression?: No Anxiety?: No  Physical Exam: BP 118/80 mmHg  Pulse 102  Ht $R'5\' 10"'wC$  (1.778 m)  Wt 285 lb 9.6 oz (129.547 kg)  BMI 40.98 kg/m2  Constitutional:  Alert and oriented, No acute distress. HEENT: [place] AT, moist mucus membranes.  Trachea midline, no masses. Cardiovascular: No clubbing, cyanosis, or edema. Respiratory: Normal respiratory effort, no increased work of breathing. GI: Abdomen is soft, nontender, panniculus distended, no abdominal masses GU: No CVA tenderness. Uncircumcised. Good-sized penis testicles descended and normal no hydrocele varicocele or spermatocele. Skin: No rashes, bruises or suspicious lesions. Lymph: No cervical or inguinal adenopathy. Neurologic: Grossly intact, no focal deficits, moving all 4 extremities. Psychiatric: Normal mood and affect.  Laboratory Data: No results found for: WBC, HGB, HCT, MCV, PLT  Lab Results  Component Value Date   CREATININE 0.8 05/27/2015    Lab Results  Component Value Date   PSA 0.3ng/dL 05/27/2015    No results found for: TESTOSTERONE  Lab  Results  Component Value Date   HGBA1C 11.8 09/12/2015    Urinalysis No results found for: COLORURINE, APPEARANCEUR, LABSPEC, PHURINE, GLUCOSEU, HGBUR, BILIRUBINUR, KETONESUR, PROTEINUR, UROBILINOGEN, NITRITE, LEUKOCYTESUR  Pertinent Imaging: None   Assessment & Plan:  Patient with both erectile dysfunction and hypogonadism originally been on maximum 2 swipes day from on angiogram now. His erectile dysfunction as well treated with Viagra 10 mg daily. Expensive this is too much so I'll put him on Cialis 5 mg every other day as the half life of this is long enough he can take it every other day with sugar good results. His big concern is his mental lose his marriage. His wife is not happy with the fact that he is unable to hold maintain an erection he does not want to have a penile prosthesis  at this point. I think he be a difficult prosthesis because of his obesity. I certainly would do a penile scrotal approach rather than a infrapubic approach. I will follow-up with him in 2 months results of his new medications  1. Hypogonadism in male Very little energy and it stopped his exercise on will he is being worked up by new physician for his heart disease diabetes. He wants to be back on it and put him on Androderm patches. We use the 4 mg patches as he is 5 foot 10 285 - testosterone (ANDRODERM) 4 MG/24HR PT24 patch; Place 1 patch onto the skin daily.  Dispense: 30 patch; Refill: 4   Return in about 2 months (around 12/01/2015) for Hypogonadism, Erectile Dysfunction.  Collier Flowers, [facility] [address], [place],  [address] [phone]

## 2015-10-13 ENCOUNTER — Ambulatory Visit: Payer: BLUE CROSS/BLUE SHIELD | Admitting: Family Medicine

## 2015-10-17 ENCOUNTER — Other Ambulatory Visit: Payer: Self-pay | Admitting: Family Medicine

## 2015-10-22 ENCOUNTER — Encounter (INDEPENDENT_AMBULATORY_CARE_PROVIDER_SITE_OTHER): Payer: Self-pay

## 2015-10-22 ENCOUNTER — Encounter: Payer: Self-pay | Admitting: Family Medicine

## 2015-10-22 ENCOUNTER — Ambulatory Visit (INDEPENDENT_AMBULATORY_CARE_PROVIDER_SITE_OTHER): Payer: BLUE CROSS/BLUE SHIELD | Admitting: Family Medicine

## 2015-10-22 VITALS — BP 138/76 | HR 99 | Temp 98.3°F | Resp 12 | Wt 286.1 lb

## 2015-10-22 DIAGNOSIS — E1165 Type 2 diabetes mellitus with hyperglycemia: Secondary | ICD-10-CM | POA: Diagnosis not present

## 2015-10-22 DIAGNOSIS — E1169 Type 2 diabetes mellitus with other specified complication: Secondary | ICD-10-CM

## 2015-10-22 DIAGNOSIS — N521 Erectile dysfunction due to diseases classified elsewhere: Secondary | ICD-10-CM

## 2015-10-22 DIAGNOSIS — IMO0002 Reserved for concepts with insufficient information to code with codable children: Secondary | ICD-10-CM

## 2015-10-22 DIAGNOSIS — E1151 Type 2 diabetes mellitus with diabetic peripheral angiopathy without gangrene: Secondary | ICD-10-CM

## 2015-10-22 DIAGNOSIS — I10 Essential (primary) hypertension: Secondary | ICD-10-CM | POA: Diagnosis not present

## 2015-10-22 MED ORDER — HYDROCHLOROTHIAZIDE 50 MG PO TABS: 50.0000 mg | ORAL_TABLET | Freq: Every day | ORAL | Status: DC

## 2015-10-22 NOTE — Progress Notes (Signed)
Name: Clarence Black   MRN: [id number]    DOB: [birth year]   Date:10/22/2015       Progress Note  Subjective  Chief Complaint  Chief Complaint  Patient presents with  . Hypertension    four week follow-up, patient has been taking his meds as directed.  . Diabetes    four week follow-up, patient checks his blood sugar daily and has a copy of his readings.    HPI  Clarence Black is a 49 year old male here today for routine follow up of Obesity, Diabetes Type II, HLD, HTN. He was last seen 1 month ago and due to uncontrolled HTN and glucose he was asked to follow up closely. Changes made at our last visit include:  Re ordered new medications to include Januvia 100 mg one a day, Jardiance 25 mg one a day, Metformin 1000 mg twice a day. New glucometer and testing supplies ordered. Referral to lifestyle center for nutritional education. He kept log of blood glucose and has brought glucometer today. Highest recorded glucose was post meal 260.   Amlodipine increased from 5 to 10 mg a day. He was to continue Lisinopril 40 mg a day and HCTZ 25 mg a day.  He was first diagnosed with Diabetes Type 2 several years ago in his 30s and has had a hard time with consistent glycemic control due to compliance with diet, lifestyle and general lack of exercise in the past. Clarence Black's wife previously expressed concern that he eats large portions, desserts, non-diabetic friendly foods despite her efforts to create an environment in their home that would facilitate better control of his health issues. They also have a home gym that he did not use previously but has made more efforts to exercise recently. Clarence Black is appropriately concerned about his health and acknowledges that he needs to make more efforts. Related symptomsinclude fatigue, erectile dysfunction and do not include paresthesia, polydipsia, polyuria, wounds, ulcers and gastroparesis. Last diabetic eye exam is unknown.   1 month ago he weighed 297 lbs  and today he is 286 lbs.   Urology specialist has placed him back on Androderm 4mg /24hr patch. He notes minimal change in ED symptoms.  Past Medical History  Diagnosis Date  . Diabetes mellitus without complication (H[facility])   . Hypertension   . High cholesterol   . Obesity   . Erectile dysfunction   . Hypogonadism, male   . Uncontrolled type 2 diabetes mellitus with peripheral circulatory disorder ([facility]) 06/13/2015  . Hypertension goal BP (blood pressure) < 140/90 06/13/2015  . Erectile dysfunction associated with type 2 diabetes mellitus ([facility]) 02/20/2015  . Type 2 diabetes mellitus ([facility]) 10/01/2015  . Hyperlipidemia LDL goal <100 06/13/2015  . Obesity, Class III, BMI 40-49.9 (morbid obesity) ([facility]) 02/20/2015    Patient Active Problem List   Diagnosis Date Noted  . Type 2 diabetes mellitus ([facility]) 10/01/2015  . Encounter for immunization 09/12/2015  . Uncontrolled type 2 diabetes mellitus with peripheral circulatory disorder ([facility]) 06/13/2015  . Hyperlipidemia LDL goal <100 06/13/2015  . Hypertension goal BP (blood pressure) < 140/90 06/13/2015  . Recurrent boils 06/13/2015  . Erectile dysfunction associated with type 2 diabetes mellitus (HCC) 02/20/2015  . Obesity, Class III, BMI 40-49.9 (morbid obesity) (HCC) 02/20/2015  . Morbid (severe) obesity due to excess calories (HCC) 02/20/2015    Social History  Substance Use Topics  . Smoking status: Never Smoker   . Smokeless tobacco: Not on file  . Alcohol Use: No  Current outpatient prescriptions:  .  amLODipine (NORVASC) 10 MG tablet, Take 1 tablet (10 mg total) by mouth daily., Disp: 90 tablet, Rfl: 3 .  atorvastatin (LIPITOR) 40 MG tablet, Take 40 mg by mouth daily., Disp: , Rfl:  .  Blood Glucose Monitoring Suppl (ONE TOUCH ULTRA 2) W/DEVICE KIT, 1 Device by Does not apply route 3 (three) times daily., Disp: 1 each, Rfl: 0 .  Blood Pressure Monitoring (BLOOD PRESSURE MONITOR AUTOMAT) DEVI, 1 Device by Does not apply route daily.,  Disp: 1 Device, Rfl: 0 .  empagliflozin (JARDIANCE) 25 MG TABS tablet, Take 25 mg by mouth daily., Disp: 90 tablet, Rfl: 2 .  glipiZIDE-metformin (METAGLIP) 2.5-500 MG tablet, Take 1 tablet by mouth 2 (two) times daily before a meal., Disp: , Rfl:  .  glucose blood (ONE TOUCH ULTRA TEST) test strip, Use as instructed, Disp: 300 each, Rfl: 3 .  hydrochlorothiazide (HYDRODIURIL) 25 MG tablet, Take 1 tablet (25 mg total) by mouth daily., Disp: 90 tablet, Rfl: 1 .  lisinopril (PRINIVIL,ZESTRIL) 40 MG tablet, Take 1 tablet (40 mg total) by mouth daily., Disp: 90 tablet, Rfl: 1 .  meloxicam (MOBIC) 15 MG tablet, TAKE 1 TABLET (15 MG TOTAL) BY MOUTH DAILY., Disp: 90 tablet, Rfl: 1 .  metFORMIN (GLUCOPHAGE) 1000 MG tablet, Take 1 tablet (1,000 mg total) by mouth 2 (two) times daily with a meal., Disp: 180 tablet, Rfl: 3 .  neomycin-polymyxin-hydrocortisone (CORTISPORIN) otic solution, Apply on to two drops to toe after soaking twice daily., Disp: 10 mL, Rfl: 0 .  ONETOUCH DELICA LANCETS FINE MISC, 1 Device by Does not apply route 3 (three) times daily., Disp: 300 each, Rfl: 3 .  sitaGLIPtin (JANUVIA) 100 MG tablet, Take 1 tablet (100 mg total) by mouth daily., Disp: 90 tablet, Rfl: 2 .  tadalafil (CIALIS) 5 MG tablet, Take 1 tablet (5 mg total) by mouth daily as needed for erectile dysfunction., Disp: 10 tablet, Rfl: 2 .  testosterone (ANDRODERM) 4 MG/24HR PT24 patch, Place 1 patch onto the skin daily., Disp: 30 patch, Rfl: 4 .  TRADJENTA 5 MG TABS tablet, , Disp: , Rfl:   History reviewed. No pertinent past surgical history.  Family History  Problem Relation Age of Onset  . Diabetes Mother   . Kidney disease Mother     No Known Allergies   Review of Systems  CONSTITUTIONAL: No significant weight changes, fever, chills, weakness or fatigue.  HEENT:  - Eyes: No visual changes.  - Ears: No auditory changes. No pain.  - Nose: No sneezing, congestion, runny nose. - Throat: No sore throat. No  changes in swallowing. SKIN: No rash or itching.  CARDIOVASCULAR: No chest pain, chest pressure or chest discomfort. No palpitations or edema.  RESPIRATORY: No shortness of breath, cough or sputum.  GASTROINTESTINAL: No anorexia, nausea, vomiting. No changes in bowel habits. No abdominal pain or blood.  GENITOURINARY: No dysuria. No frequency. No discharge.  NEUROLOGICAL: No headache, dizziness, syncope, paralysis, ataxia, numbness or tingling in the extremities. No memory changes. No change in bowel or bladder control.  MUSCULOSKELETAL: No joint pain. No muscle pain. HEMATOLOGIC: No anemia, bleeding or bruising.  LYMPHATICS: No enlarged lymph nodes.  PSYCHIATRIC: No change in mood. No change in sleep pattern.  ENDOCRINOLOGIC: No reports of sweating, cold or heat intolerance. No polyuria or polydipsia.    Objective  BP 138/76 mmHg  Pulse 99  Temp(Src) 98.3 F (36.8 C) (Oral)  Resp 12  Wt 286 lb 1.6 oz (  129.774 kg)  SpO2 99% Body mass index is 41.05 kg/(m^2).  Physical Exam  Constitutional: Patient is obese and well-nourished. In no distress.  Cardiovascular: Normal rate, regular rhythm and normal heart sounds. No murmur heard.  Pulmonary/Chest: Effort normal and breath sounds normal. No respiratory distress. Musculoskeletal: Normal range of motion bilateral UE and LE, no joint effusions. Peripheral vascular: Bilateral LE no edema. Neurological: CN II-XII grossly intact with no focal deficits. Alert and oriented to person, place, and time. Coordination, balance, strength, speech and gait are normal.  Skin: Skin is warm and dry. No rash noted. No erythema.  Psychiatric: Patient has a stable mood and affect. Behavior is normal in office today. Judgment and thought content normal in office today.  Recent Results (from the past 2160 hour(s))  POCT glycosylated hemoglobin (Hb A1C)     Status: Abnormal   Collection Time: 09/12/15  2:05 PM  Result Value Ref Range    Hemoglobin A1C 11.8   POCT UA - Microalbumin     Status: Abnormal   Collection Time: 09/12/15  2:06 PM  Result Value Ref Range   Microalbumin Ur, POC 100 mg/L   Creatinine, POC  mg/dL   Albumin/Creatinine Ratio, Urine, POC       Assessment & Plan  1. Uncontrolled type 2 diabetes mellitus with peripheral circulatory disorder ([place]) Reviewed glucose logs ranges from 150-200 average, significantly improved. Continue current medications and continue with lifestyle changes.   2. Hypertension goal BP (blood pressure) < 140/90 Improved however Mr. Debruin is concerned that his home BP readings are borderline so we decided to increase HCTZ from 25 mg to 50 mg. May take 2 tabs a day of $Remo'25mg'zjkkV$  tablets he has at home to finish.  - hydrochlorothiazide (HYDRODIURIL) 50 MG tablet; Take 1 tablet (50 mg total) by mouth daily.  Dispense: 90 tablet; Refill: 2  3. Erectile dysfunction associated with type 2 diabetes mellitus ([place]) Continue f/u with Urologist.   4. Obesity, Class III, BMI 40-49.9 (morbid obesity) ([place]) Has lost 10 lbs since last visit. Continue efforts.

## 2015-12-01 ENCOUNTER — Ambulatory Visit: Payer: BLUE CROSS/BLUE SHIELD

## 2015-12-04 ENCOUNTER — Ambulatory Visit: Payer: BLUE CROSS/BLUE SHIELD | Admitting: *Deleted

## 2015-12-04 DIAGNOSIS — M722 Plantar fascial fibromatosis: Secondary | ICD-10-CM

## 2015-12-04 NOTE — Progress Notes (Signed)
Patient ID: Clarence Black, male   DOB: 08/19/1966, 49 y.o.   MRN: [id number] Patient presents for orthotic pick up.  Verbal and written break in and wear instructions given.  Patient will follow up in 4 weeks if symptoms worsen or fail to improve.

## 2015-12-04 NOTE — Patient Instructions (Signed)

## 2015-12-05 ENCOUNTER — Ambulatory Visit (INDEPENDENT_AMBULATORY_CARE_PROVIDER_SITE_OTHER): Payer: BLUE CROSS/BLUE SHIELD | Admitting: Urology

## 2015-12-05 ENCOUNTER — Other Ambulatory Visit: Payer: Self-pay

## 2015-12-05 ENCOUNTER — Encounter: Payer: Self-pay | Admitting: Urology

## 2015-12-05 VITALS — BP 144/84 | HR 105 | Ht 70.0 in | Wt 283.0 lb

## 2015-12-05 DIAGNOSIS — E1169 Type 2 diabetes mellitus with other specified complication: Secondary | ICD-10-CM | POA: Diagnosis not present

## 2015-12-05 DIAGNOSIS — N521 Erectile dysfunction due to diseases classified elsewhere: Secondary | ICD-10-CM | POA: Diagnosis not present

## 2015-12-05 DIAGNOSIS — Z79899 Other long term (current) drug therapy: Secondary | ICD-10-CM | POA: Diagnosis not present

## 2015-12-05 DIAGNOSIS — E291 Testicular hypofunction: Secondary | ICD-10-CM

## 2015-12-05 MED ORDER — TADALAFIL 5 MG PO TABS: 5.0000 mg | ORAL_TABLET | Freq: Every day | ORAL | Status: DC

## 2015-12-05 NOTE — Addendum Note (Signed)
Addended by: Lestine Box on: 12/05/2015 02:43 PM   Modules accepted: Orders

## 2015-12-05 NOTE — Progress Notes (Signed)
12/05/2015 2:22 PM   [name] January 28, 1966 [id number]  Referring provider: Bobetta Lime, MD [address] [place], [place] [address]  Chief Complaint  Patient presents with  . Hypogonadism     f/u ED    HPI: The patient is a 49 year old male with uncontrolled diabetes (recent HgA1c of 11.8) who is following up for his hypogonadism and erectile dysfunction. At his last visit, he was started on Cialis 5 mg every other day and Androderm 4 mg per 24 hour patch daily. He feels that this helped him and his energy levels are better. He also is able to complete intercourse with erections. He overwrap both medications 3 weeks ago. He has noticed decrease in energy since that time as well as worsening erectile dysfunction.    PMH: Past Medical History  Diagnosis Date  . Diabetes mellitus without complication ([place])   . Hypertension   . High cholesterol   . Obesity   . Erectile dysfunction   . Hypogonadism, male   . Uncontrolled type 2 diabetes mellitus with peripheral circulatory disorder ([place]) 06/13/2015  . Hypertension goal BP (blood pressure) < 140/90 06/13/2015  . Erectile dysfunction associated with type 2 diabetes mellitus ([place]) 02/20/2015  . Type 2 diabetes mellitus ([place]) 10/01/2015  . Hyperlipidemia LDL goal <100 06/13/2015  . Obesity, Class III, BMI 40-49.9 (morbid obesity) ([place]) 02/20/2015    Surgical History: No past surgical history on file.  Home Medications:    Medication List       This list is accurate as of: 12/05/15  2:22 PM.  Always use your most recent med list.               amLODipine 10 MG tablet  Commonly known as:  NORVASC  Take 1 tablet (10 mg total) by mouth daily.     amLODipine 5 MG tablet  Commonly known as:  NORVASC  Take 5 mg by mouth daily.     atorvastatin 40 MG tablet  Commonly known as:  LIPITOR  Take 40 mg by mouth daily.     Blood Pressure Monitor Automat Devi  1 Device by Does not apply route daily.     empagliflozin 25 MG Tabs tablet  Commonly known as:  JARDIANCE  Take 25 mg by mouth daily.     glipiZIDE-metformin 2.5-500 MG tablet  Commonly known as:  METAGLIP  Take 1 tablet by mouth 2 (two) times daily before a meal.     glucose blood test strip  Commonly known as:  ONE TOUCH ULTRA TEST  Use as instructed     hydrochlorothiazide 50 MG tablet  Commonly known as:  HYDRODIURIL  Take 1 tablet (50 mg total) by mouth daily.     lisinopril 40 MG tablet  Commonly known as:  PRINIVIL,ZESTRIL  Take 1 tablet (40 mg total) by mouth daily.     meloxicam 15 MG tablet  Commonly known as:  MOBIC  TAKE 1 TABLET (15 MG TOTAL) BY MOUTH DAILY.     metFORMIN 1000 MG tablet  Commonly known as:  GLUCOPHAGE  Take 1 tablet (1,000 mg total) by mouth 2 (two) times daily with a meal.     neomycin-polymyxin-hydrocortisone otic solution  Commonly known as:  CORTISPORIN  Apply on to two drops to toe after soaking twice daily.     ONE TOUCH ULTRA 2 W/DEVICE Kit  1 Device by Does not apply route 3 (three) times daily.     [facility]  1 Device by Does not apply route 3 (three) times daily.     sitaGLIPtin 100 MG tablet  Commonly known as:  JANUVIA  Take 1 tablet (100 mg total) by mouth daily.     tadalafil 5 MG tablet  Commonly known as:  CIALIS  Take 1 tablet (5 mg total) by mouth daily as needed for erectile dysfunction.     tadalafil 5 MG tablet  Commonly known as:  CIALIS  Take 1 tablet (5 mg total) by mouth daily.     testosterone 4 MG/24HR Pt24 patch  Commonly known as:  ANDRODERM  Place 1 patch onto the skin daily.     TRADJENTA 5 MG Tabs tablet  Generic drug:  linagliptin  TAKE 1 TABLET BY MOUTH DAILY        Allergies: No Known Allergies  Family History: Family History  Problem Relation Age of Onset  . Diabetes Mother   . Kidney disease Mother     Social History:  reports that he has never smoked. He does not have any smokeless tobacco history  on file. He reports that he does not drink alcohol or use illicit drugs.  ROS: UROLOGY Frequent Urination?: Yes Hard to postpone urination?: No Burning/pain with urination?: No Get up at night to urinate?: Yes Leakage of urine?: Yes Urine stream starts and stops?: No Trouble starting stream?: No Do you have to strain to urinate?: No Blood in urine?: No Urinary tract infection?: No Sexually transmitted disease?: No Injury to kidneys or bladder?: No Painful intercourse?: No Weak stream?: No Erection problems?: No Penile pain?: No  Gastrointestinal Nausea?: No Vomiting?: No Indigestion/heartburn?: No Diarrhea?: No Constipation?: No  Constitutional Fever: No Night sweats?: No Weight loss?: No Fatigue?: No  Skin Skin rash/lesions?: No Itching?: No  Eyes Blurred vision?: No Double vision?: No  Ears/Nose/Throat Sore throat?: No Sinus problems?: No  Hematologic/Lymphatic Swollen glands?: No Easy bruising?: No  Cardiovascular Leg swelling?: No Chest pain?: No  Respiratory Cough?: No  Endocrine Excessive thirst?: No  Musculoskeletal Back pain?: No Joint pain?: No  Neurological Headaches?: No Dizziness?: No  Psychologic Depression?: No  Physical Exam: BP 144/84 mmHg  Pulse 105  Ht 5' 10"  (1.778 m)  Wt 283 lb (128.368 kg)  BMI 40.61 kg/m2  Constitutional:  Alert and oriented, No acute distress. HEENT: [place] AT, moist mucus membranes.  Trachea midline, no masses. Cardiovascular: No clubbing, cyanosis, or edema. Respiratory: Normal respiratory effort, no increased work of breathing. GI: Abdomen is soft, nontender, nondistended, no abdominal masses GU: No CVA tenderness.  Skin: No rashes, bruises or suspicious lesions. Lymph: No cervical or inguinal adenopathy. Neurologic: Grossly intact, no focal deficits, moving all 4 extremities. Psychiatric: Normal mood and affect.  Laboratory Data: No results found for: WBC, HGB, HCT, MCV, PLT  Lab Results   Component Value Date   CREATININE 0.8 05/27/2015    Lab Results  Component Value Date   PSA 0.3ng/dL 05/27/2015    No results found for: TESTOSTERONE  Lab Results  Component Value Date   HGBA1C 11.8 09/12/2015    Urinalysis No results found for: COLORURINE, APPEARANCEUR, LABSPEC, PHURINE, GLUCOSEU, HGBUR, BILIRUBINUR, KETONESUR, PROTEINUR, UROBILINOGEN, NITRITE, LEUKOCYTESUR   Assessment & Plan:    The patient has been treated for hypogonadism and erectile dysfunction. He ran out of his testosterone approximately 3 weeks ago. He has not had any recent labs to monitor his response to testosterone. We will order those labs at this time. If his testosterone is low as  I suspect it is, we'll restart his testosterone placement.  1. Hypogonadism -Check Testosterone, CBC, PSA, lipid profile, hepatic function, TSH, FSH, LH, prolactin -will restart T replacement if low  2. Erectile Dysfunction -Cialis 5 mg daily with refills for the patient. He was warned of the risk of priapism.   Return in about 2 months (around 02/05/2016).  Nickie Retort, MD  [facility] [address] [place], [place] [address] [phone]

## 2015-12-09 ENCOUNTER — Encounter: Payer: Self-pay | Admitting: Family Medicine

## 2015-12-09 ENCOUNTER — Ambulatory Visit (INDEPENDENT_AMBULATORY_CARE_PROVIDER_SITE_OTHER): Payer: BLUE CROSS/BLUE SHIELD | Admitting: Family Medicine

## 2015-12-09 VITALS — BP 140/80 | HR 106 | Temp 98.2°F | Resp 18 | Wt 283.3 lb

## 2015-12-09 DIAGNOSIS — IMO0002 Reserved for concepts with insufficient information to code with codable children: Secondary | ICD-10-CM

## 2015-12-09 DIAGNOSIS — E1169 Type 2 diabetes mellitus with other specified complication: Secondary | ICD-10-CM | POA: Diagnosis not present

## 2015-12-09 DIAGNOSIS — E1151 Type 2 diabetes mellitus with diabetic peripheral angiopathy without gangrene: Secondary | ICD-10-CM | POA: Diagnosis not present

## 2015-12-09 DIAGNOSIS — J069 Acute upper respiratory infection, unspecified: Secondary | ICD-10-CM | POA: Insufficient documentation

## 2015-12-09 DIAGNOSIS — E1165 Type 2 diabetes mellitus with hyperglycemia: Secondary | ICD-10-CM | POA: Diagnosis not present

## 2015-12-09 DIAGNOSIS — N521 Erectile dysfunction due to diseases classified elsewhere: Secondary | ICD-10-CM | POA: Diagnosis not present

## 2015-12-09 DIAGNOSIS — Z23 Encounter for immunization: Secondary | ICD-10-CM | POA: Diagnosis not present

## 2015-12-09 DIAGNOSIS — Z0000 Encounter for general adult medical examination without abnormal findings: Secondary | ICD-10-CM

## 2015-12-09 NOTE — Progress Notes (Signed)
Name: Clarence Black   MRN: [id number]    DOB: 01-May-1966   Date:12/09/2015       Progress Note  Subjective  Chief Complaint  Chief Complaint  Patient presents with  . Annual Exam    patient reports of sneezing and congestion for about 2 days.    HPI  Patient is here today for a Complete Male Physical Exam:  If you may recall Mr. Calieb Lichtman has uncontrolled DM II with subsequent erectile dysfunction. Has consulted with Urology specialist, started on Cialis 5 mg po q48hrs plus testosterone 4 mg/24hr patch which has helped with ED and energy. Has lab work ordered by Urology on 12/05/15 which he was not aware of until today.    Current prescribed DM II medications include Januvia 100 mg one a day, Jardiance 25 mg one a day, Metformin 1000 mg twice a day.   Overall feels health needs are getting better. Working on his diet, has lost some more weight since previous visit. In general does not exercise regularly. Sees dentist regularly and addresses vision concerns with ophthalmologist if applicable. In regards to sexual activity the patient is currently sexually active. Currently is concerned about exposure to any STDs.   Has some nasal congestion and sinus pressure. No cough, fevers, sore throat.   Active Ambulatory Problems    Diagnosis Date Noted  . Uncontrolled type 2 diabetes mellitus with peripheral circulatory disorder ([place]) 06/13/2015  . Hyperlipidemia LDL goal <100 06/13/2015  . Hypertension goal BP (blood pressure) < 140/90 06/13/2015  . Erectile dysfunction associated with type 2 diabetes mellitus ([place]) 02/20/2015  . Obesity, Class III, BMI 40-49.9 (morbid obesity) ([place]) 02/20/2015  . Recurrent boils 06/13/2015  . Encounter for immunization 09/12/2015  . Annual physical exam 12/09/2015   Resolved Ambulatory Problems    Diagnosis Date Noted  . Type 2 diabetes mellitus ([place]) 10/01/2015  . Morbid (severe) obesity due to excess calories ([place]) 02/20/2015   Past Medical  History  Diagnosis Date  . Diabetes mellitus without complication ([place])   . Hypertension   . High cholesterol   . Obesity   . Erectile dysfunction   . Hypogonadism, male     History reviewed. No pertinent past surgical history.  Family History  Problem Relation Age of Onset  . Diabetes Mother   . Kidney disease Mother     Social History   Social History  . Marital Status: Married    Spouse Name: N/A  . Number of Children: N/A  . Years of Education: N/A   Occupational History  . Not on file.   Social History Main Topics  . Smoking status: Never Smoker   . Smokeless tobacco: Not on file  . Alcohol Use: No  . Drug Use: No  . Sexual Activity:    Partners: Female   Other Topics Concern  . Not on file   Social History Narrative     Current outpatient prescriptions:  .  amLODipine (NORVASC) 10 MG tablet, Take 1 tablet (10 mg total) by mouth daily., Disp: 90 tablet, Rfl: 3 .  atorvastatin (LIPITOR) 40 MG tablet, Take 40 mg by mouth daily., Disp: , Rfl:  .  Blood Glucose Monitoring Suppl (ONE TOUCH ULTRA 2) W/DEVICE KIT, 1 Device by Does not apply route 3 (three) times daily., Disp: 1 each, Rfl: 0 .  Blood Pressure Monitoring (BLOOD PRESSURE MONITOR AUTOMAT) DEVI, 1 Device by Does not apply route daily., Disp: 1 Device, Rfl: 0 .  empagliflozin (JARDIANCE) 25 MG  TABS tablet, Take 25 mg by mouth daily. (Patient not taking: Reported on 12/05/2015), Disp: 90 tablet, Rfl: 2 .  glucose blood (ONE TOUCH ULTRA TEST) test strip, Use as instructed, Disp: 300 each, Rfl: 3 .  hydrochlorothiazide (HYDRODIURIL) 50 MG tablet, Take 1 tablet (50 mg total) by mouth daily., Disp: 90 tablet, Rfl: 2 .  lisinopril (PRINIVIL,ZESTRIL) 40 MG tablet, Take 1 tablet (40 mg total) by mouth daily., Disp: 90 tablet, Rfl: 1 .  meloxicam (MOBIC) 15 MG tablet, TAKE 1 TABLET (15 MG TOTAL) BY MOUTH DAILY., Disp: 90 tablet, Rfl: 1 .  metFORMIN (GLUCOPHAGE) 1000 MG tablet, Take 1 tablet (1,000 mg total) by  mouth 2 (two) times daily with a meal., Disp: 180 tablet, Rfl: 3 .  neomycin-polymyxin-hydrocortisone (CORTISPORIN) otic solution, Apply on to two drops to toe after soaking twice daily., Disp: 10 mL, Rfl: 0 .  ONETOUCH DELICA LANCETS FINE MISC, 1 Device by Does not apply route 3 (three) times daily., Disp: 300 each, Rfl: 3 .  sitaGLIPtin (JANUVIA) 100 MG tablet, Take 1 tablet (100 mg total) by mouth daily. (Patient not taking: Reported on 12/05/2015), Disp: 90 tablet, Rfl: 2 .  tadalafil (CIALIS) 5 MG tablet, Take 1 tablet (5 mg total) by mouth daily as needed for erectile dysfunction., Disp: 10 tablet, Rfl: 2 .  tadalafil (CIALIS) 5 MG tablet, Take 1 tablet (5 mg total) by mouth daily., Disp: 30 tablet, Rfl: 11 .  testosterone (ANDRODERM) 4 MG/24HR PT24 patch, Place 1 patch onto the skin daily., Disp: 30 patch, Rfl: 4 .  TRADJENTA 5 MG TABS tablet, TAKE 1 TABLET BY MOUTH DAILY, Disp: 90 tablet, Rfl: 3  No Known Allergies  ROS  CONSTITUTIONAL: No significant weight changes, fever, chills, weakness or fatigue.  HEENT:  - Eyes: No visual changes.  - Ears: No auditory changes. No pain.  - Nose: Yes sneezing, congestion, runny nose. - Throat: No sore throat. No changes in swallowing. SKIN: No rash or itching.  CARDIOVASCULAR: No chest pain, chest pressure or chest discomfort. No palpitations or edema.  RESPIRATORY: No shortness of breath, cough or sputum.  GASTROINTESTINAL: No anorexia, nausea, vomiting. No changes in bowel habits. No abdominal pain or blood.  GENITOURINARY: No dysuria. No frequency. No discharge.  NEUROLOGICAL: No headache, dizziness, syncope, paralysis, ataxia, numbness or tingling in the extremities. No memory changes. No change in bowel or bladder control.  MUSCULOSKELETAL: No joint pain. No muscle pain. HEMATOLOGIC: No anemia, bleeding or bruising.  LYMPHATICS: No enlarged lymph nodes.  PSYCHIATRIC: No change in mood. No change in sleep pattern.  ENDOCRINOLOGIC: No  reports of sweating, cold or heat intolerance. No polyuria or polydipsia.   Objective  Filed Vitals:   12/09/15 1133  BP: 140/80  Pulse: 106  Temp: 98.2 F (36.8 C)  TempSrc: Oral  Resp: 18  Weight: 283 lb 4.8 oz (128.504 kg)  SpO2: 98%   Body mass index is 40.65 kg/(m^2).  Depression screen [facility] 2/9 12/09/2015 09/12/2015 06/13/2015  Decreased Interest 0 0 0  Down, Depressed, Hopeless 0 0 0  PHQ - 2 Score 0 0 0    Recent Results (from the past 2160 hour(s))  POCT glycosylated hemoglobin (Hb A1C)     Status: Abnormal   Collection Time: 09/12/15  2:05 PM  Result Value Ref Range   Hemoglobin A1C 11.8   POCT UA - Microalbumin     Status: Abnormal   Collection Time: 09/12/15  2:06 PM  Result Value Ref Range  Microalbumin Ur, POC 100 mg/L   Creatinine, POC  mg/dL   Albumin/Creatinine Ratio, Urine, POC      Physical Exam  Constitutional: Patient appears obese and well-nourished. In no distress.  HEENT:  - Head: Normocephalic and atraumatic.  - Ears: Bilateral TMs gray, no erythema or effusion - Nose: Nasal mucosa moist - Mouth/Throat: Oropharynx is clear and moist. No tonsillar hypertrophy or erythema. No post nasal drainage.  - Eyes: Wearing glasses, Conjunctivae clear, EOM movements normal. PERRLA. No scleral icterus.  Neck: Normal range of motion. Neck supple. No JVD present. No thyromegaly present.  Cardiovascular: Normal rate, regular rhythm and normal heart sounds.  No murmur heard.  Pulmonary/Chest: Effort normal and breath sounds normal. No respiratory distress. Abdominal: Soft. Bowel sounds are normal, no distension. There is no tenderness. no masses BREAST: Bilateral breast exam normal with no masses, skin changes or nipple discharge MALE GENITALIA: Bilateral testes descended with no masses, no penile lesions, no penile discharge. PROSTATE: Deferred RECTAL: Deferred Musculoskeletal: Normal range of motion bilateral UE and LE, no joint effusions. Peripheral  vascular: Bilateral LE no edema. Neurological: CN II-XII grossly intact with no focal deficits. Alert and oriented to person, place, and time. Coordination, balance, strength, speech and gait are normal.  Skin: Skin is warm and dry. No rash noted. Excoriations on his back. Left mid-lower back just lateral to spine a raised flesh colored lesion with 2 black heads. Non tender. Psychiatric: Patient has a normal mood and affect. Behavior is normal in office today. Judgment and thought content normal in office today.  Diabetic Foot Exam - Simple   Simple Foot Form  Diabetic Foot exam was performed with the following findings:  Yes 12/09/2015 12:23 PM  Visual Inspection  No deformities, no ulcerations, no other skin breakdown bilaterally:  Yes  Sensation Testing  Intact to touch and monofilament testing bilaterally:  Yes  Pulse Check  Posterior Tibialis and Dorsalis pulse intact bilaterally:  Yes  Comments       Assessment & Plan  1. Annual physical exam Discussed in detail all recommended preventative measures appropriate for age and gender now and in the future.  2. Uncontrolled type 2 diabetes mellitus with peripheral circulatory disorder [facility]) Patient states he is taking all of his medications. Will get Hba1c at lab and adjust med doses if needed. Needs retinal eye exam.  - Hemoglobin A1c  3. Erectile dysfunction associated with type 2 diabetes mellitus ([place]) Improved with testosterone patch and cialis. Printed out labs ordered by urologist for patient to have drawn fasting in the am.  4. Need for diphtheria-tetanus-pertussis (Tdap) vaccine, adult/adolescent  - Tdap vaccine greater than or equal to 7yo IM   5. Viral URI Symptomatic treatment recommended. Call me if symptoms worsen.

## 2015-12-12 ENCOUNTER — Ambulatory Visit: Payer: BLUE CROSS/BLUE SHIELD | Admitting: Family Medicine

## 2015-12-17 ENCOUNTER — Other Ambulatory Visit: Payer: Self-pay

## 2015-12-17 ENCOUNTER — Encounter: Payer: Self-pay | Admitting: Family Medicine

## 2015-12-17 LAB — HEPATIC FUNCTION PANEL
ALBUMIN: 4.3 g/dL (ref 3.5–5.5)
ALK PHOS: 63 IU/L (ref 39–117)
ALT: 19 IU/L (ref 0–44)
AST: 27 IU/L (ref 0–40)
Bilirubin Total: 0.8 mg/dL (ref 0.0–1.2)
Bilirubin, Direct: 0.18 mg/dL (ref 0.00–0.40)
TOTAL PROTEIN: 7.1 g/dL (ref 6.0–8.5)

## 2015-12-17 LAB — LIPID PANEL
CHOL/HDL RATIO: 5.7 ratio — AB (ref 0.0–5.0)
Cholesterol, Total: 205 mg/dL — ABNORMAL HIGH (ref 100–199)
HDL: 36 mg/dL — ABNORMAL LOW (ref >39)
LDL CALC: 129 mg/dL — AB (ref 0–99)
Triglycerides: 198 mg/dL — ABNORMAL HIGH (ref 0–149)
VLDL CHOLESTEROL CAL: 40 mg/dL (ref 5–40)

## 2015-12-17 LAB — TSH: TSH: 0.918 u[IU]/mL (ref 0.450–4.500)

## 2015-12-17 LAB — CBC
HEMATOCRIT: 45.9 % (ref 37.5–51.0)
HEMOGLOBIN: 15.7 g/dL (ref 12.6–17.7)
MCH: 28.3 pg (ref 26.6–33.0)
MCHC: 34.2 g/dL (ref 31.5–35.7)
MCV: 83 fL (ref 79–97)
Platelets: 165 10*3/uL (ref 150–379)
RBC: 5.55 x10E6/uL (ref 4.14–5.80)
RDW: 14.8 % (ref 12.3–15.4)
WBC: 5.9 10*3/uL (ref 3.4–10.8)

## 2015-12-17 LAB — TESTOSTERONE: TESTOSTERONE: 199 ng/dL — AB (ref 348–1197)

## 2015-12-17 LAB — HEMOGLOBIN A1C
Est. average glucose Bld gHb Est-mCnc: 229 mg/dL
Hgb A1c MFr Bld: 9.6 % — ABNORMAL HIGH (ref 4.8–5.6)

## 2015-12-17 LAB — FSH/LH
FSH: 4.8 m[IU]/mL (ref 1.5–12.4)
LH: 2.4 m[IU]/mL (ref 1.7–8.6)

## 2015-12-17 LAB — PROLACTIN: Prolactin: 7.4 ng/mL (ref 4.0–15.2)

## 2015-12-17 LAB — PSA: Prostate Specific Ag, Serum: 0.3 ng/mL (ref 0.0–4.0)

## 2015-12-17 MED ORDER — ATORVASTATIN CALCIUM 80 MG PO TABS: 80.0000 mg | ORAL_TABLET | Freq: Every day | ORAL | Status: DC

## 2015-12-17 NOTE — Telephone Encounter (Signed)
Refill request was sent to Dr. Ashany Sundaram for approval and submission.  

## 2016-01-06 ENCOUNTER — Other Ambulatory Visit: Payer: Self-pay | Admitting: Podiatry

## 2016-01-28 ENCOUNTER — Other Ambulatory Visit: Payer: Self-pay | Admitting: Family Medicine

## 2016-02-04 ENCOUNTER — Other Ambulatory Visit: Payer: Self-pay | Admitting: Podiatry

## 2016-02-04 NOTE — Telephone Encounter (Signed)
Pt needs an appt prior to future refills. 

## 2016-02-05 ENCOUNTER — Ambulatory Visit: Payer: BLUE CROSS/BLUE SHIELD

## 2016-02-26 ENCOUNTER — Ambulatory Visit: Payer: BLUE CROSS/BLUE SHIELD

## 2016-03-18 ENCOUNTER — Telehealth: Payer: Self-pay | Admitting: Urology

## 2016-03-18 ENCOUNTER — Encounter: Payer: Self-pay | Admitting: Urology

## 2016-03-18 ENCOUNTER — Ambulatory Visit (INDEPENDENT_AMBULATORY_CARE_PROVIDER_SITE_OTHER): Payer: BLUE CROSS/BLUE SHIELD | Admitting: Urology

## 2016-03-18 VITALS — BP 144/88 | HR 76 | Ht 70.5 in | Wt 284.1 lb

## 2016-03-18 DIAGNOSIS — E119 Type 2 diabetes mellitus without complications: Secondary | ICD-10-CM | POA: Insufficient documentation

## 2016-03-18 DIAGNOSIS — N5201 Erectile dysfunction due to arterial insufficiency: Secondary | ICD-10-CM | POA: Diagnosis not present

## 2016-03-18 DIAGNOSIS — E291 Testicular hypofunction: Secondary | ICD-10-CM

## 2016-03-18 DIAGNOSIS — N529 Male erectile dysfunction, unspecified: Secondary | ICD-10-CM

## 2016-03-18 DIAGNOSIS — E785 Hyperlipidemia, unspecified: Secondary | ICD-10-CM | POA: Insufficient documentation

## 2016-03-18 MED ORDER — SILDENAFIL CITRATE 20 MG PO TABS: ORAL_TABLET | ORAL | Status: DC

## 2016-03-18 NOTE — Telephone Encounter (Signed)
Does pt need appt.

## 2016-03-18 NOTE — Progress Notes (Signed)
03/18/2016 9:51 AM   Clarence Black 01-08-1966 [id number]  Referring provider: Bobetta Lime, MD No address on file  Chief Complaint  Patient presents with  . Hypogonadism    HPI: The patient is a 50 year old male with uncontrolled diabetes (recent HgA1c of 11.8) who is following up for his hypogonadism and erectile dysfunction. At his last visit, he was started on Cialis 5 mg every other day and Androderm 4 mg per 24 hour patch daily.His primary care provider stop his Androderm secondary to his uncontrolled diabetes per the patient. In terms of erections, Cialis is unable to provide a sufficient erection for him. His Shim score today is 9 which indicates moderate erectile dysfunction. He would to try different medication.   His testosterone in late December 2016 was 199. PSA was 0.3. His hepatic panel and CBC were normal.     PMH: Past Medical History  Diagnosis Date  . Diabetes mellitus without complication ([place])   . Hypertension   . High cholesterol   . Obesity   . Erectile dysfunction   . Hypogonadism, male   . Uncontrolled type 2 diabetes mellitus with peripheral circulatory disorder ([place]) 06/13/2015  . Hypertension goal BP (blood pressure) < 140/90 06/13/2015  . Erectile dysfunction associated with type 2 diabetes mellitus ([place]) 02/20/2015  . Type 2 diabetes mellitus ([place]) 10/01/2015  . Hyperlipidemia LDL goal <100 06/13/2015  . Obesity, Class III, BMI 40-49.9 (morbid obesity) ([place]) 02/20/2015    Surgical History: No past surgical history on file.  Home Medications:    Medication List       This list is accurate as of: 03/18/16  9:51 AM.  Always use your most recent med list.               amLODipine 10 MG tablet  Commonly known as:  NORVASC  Take 1 tablet (10 mg total) by mouth daily.     atorvastatin 80 MG tablet  Commonly known as:  LIPITOR  Take 1 tablet (80 mg total) by mouth daily at 6 PM.     Blood Pressure Monitor Automat Devi  1 Device by  Does not apply route daily.     empagliflozin 25 MG Tabs tablet  Commonly known as:  JARDIANCE  Take 25 mg by mouth daily.     glucose blood test strip  Commonly known as:  ONE TOUCH ULTRA TEST  Use as instructed     hydrochlorothiazide 50 MG tablet  Commonly known as:  HYDRODIURIL  Take 1 tablet (50 mg total) by mouth daily.     lisinopril 40 MG tablet  Commonly known as:  PRINIVIL,ZESTRIL  TAKE 1 TABLET (40 MG TOTAL) BY MOUTH DAILY.     meloxicam 15 MG tablet  Commonly known as:  MOBIC  TAKE 1 TABLET (15 MG TOTAL) BY MOUTH DAILY.     metFORMIN 1000 MG tablet  Commonly known as:  GLUCOPHAGE  Take 1 tablet (1,000 mg total) by mouth 2 (two) times daily with a meal.     neomycin-polymyxin-hydrocortisone otic solution  Commonly known as:  CORTISPORIN  Apply on to two drops to toe after soaking twice daily.     ONE TOUCH ULTRA 2 w/Device Kit  1 Device by Does not apply route 3 (three) times daily.     ONETOUCH DELICA LANCETS FINE Misc  1 Device by Does not apply route 3 (three) times daily.     sildenafil 20 MG tablet  Commonly known as:  REVATIO  Take  1-5 pills daily prn     sitaGLIPtin 100 MG tablet  Commonly known as:  JANUVIA  Take 1 tablet (100 mg total) by mouth daily.     tadalafil 5 MG tablet  Commonly known as:  CIALIS  Take 1 tablet (5 mg total) by mouth daily as needed for erectile dysfunction.     tadalafil 5 MG tablet  Commonly known as:  CIALIS  Take 1 tablet (5 mg total) by mouth daily.     testosterone 4 MG/24HR Pt24 patch  Commonly known as:  ANDRODERM  Place 1 patch onto the skin daily.     TRADJENTA 5 MG Tabs tablet  Generic drug:  linagliptin  TAKE 1 TABLET BY MOUTH DAILY        Allergies: No Known Allergies  Family History: Family History  Problem Relation Age of Onset  . Diabetes Mother   . Kidney disease Mother     Social History:  reports that he has never smoked. He does not have any smokeless tobacco history on file. He  reports that he does not drink alcohol or use illicit drugs.  ROS: UROLOGY Frequent Urination?: No Hard to postpone urination?: No Burning/pain with urination?: No Get up at night to urinate?: Yes Leakage of urine?: No Urine stream starts and stops?: No Trouble starting stream?: No Do you have to strain to urinate?: No Blood in urine?: No Urinary tract infection?: No Sexually transmitted disease?: No Injury to kidneys or bladder?: No Painful intercourse?: No Weak stream?: No Erection problems?: No Penile pain?: No  Gastrointestinal Nausea?: No Vomiting?: No Indigestion/heartburn?: No Diarrhea?: No Constipation?: No  Constitutional Fever: No Night sweats?: No Weight loss?: No Fatigue?: No  Skin Skin rash/lesions?: No Itching?: No  Eyes Blurred vision?: No Double vision?: No  Ears/Nose/Throat Sore throat?: No Sinus problems?: No  Hematologic/Lymphatic Swollen glands?: No Easy bruising?: No  Cardiovascular Leg swelling?: No Chest pain?: No  Respiratory Cough?: No Shortness of breath?: No  Endocrine Excessive thirst?: No  Musculoskeletal Back pain?: No Joint pain?: No  Neurological Headaches?: No Dizziness?: No  Psychologic Depression?: No Anxiety?: No  Physical Exam: BP 144/88 mmHg  Pulse 76  Ht 5' 10.5" (1.791 m)  Wt 284 lb 1.6 oz (128.867 kg)  BMI 40.17 kg/m2  Constitutional:  Alert and oriented, No acute distress. HEENT: [place] AT, moist mucus membranes.  Trachea midline, no masses. Cardiovascular: No clubbing, cyanosis, or edema. Respiratory: Normal respiratory effort, no increased work of breathing. GI: Abdomen is soft, nontender, nondistended, no abdominal masses GU: No CVA tenderness.  Skin: No rashes, bruises or suspicious lesions. Lymph: No cervical or inguinal adenopathy. Neurologic: Grossly intact, no focal deficits, moving all 4 extremities. Psychiatric: Normal mood and affect.  Laboratory Data: Lab Results  Component  Value Date   WBC 5.9 12/16/2015   HCT 45.9 12/16/2015   MCV 83 12/16/2015   PLT 165 12/16/2015    Lab Results  Component Value Date   CREATININE 0.8 05/27/2015    Lab Results  Component Value Date   PSA 0.3ng/dL 05/27/2015    Lab Results  Component Value Date   TESTOSTERONE 199* 12/16/2015    Lab Results  Component Value Date   HGBA1C 9.6* 12/16/2015    Urinalysis No results found for: COLORURINE, APPEARANCEUR, LABSPEC, PHURINE, GLUCOSEU, HGBUR, BILIRUBINUR, KETONESUR, PROTEINUR, UROBILINOGEN, NITRITE, LEUKOCYTESUR  Assessment & Plan:    1. Erectile dysfunction We will try the patient on generic sildenafil 20 mg. He was instructed take 1-5 pills daily  as needed for erections. He was warned of the risk of priapism. If this does not work, he may be a candidate for intracavernosal injections.  2. Hypogonadism -Per the patient, his primary care provider does not want him on testosterone due to his uncontrolled diabetes. We will defer hypogonadism treatment at this time.    Return in about 3 months (around 06/18/2016).  Nickie Retort, MD  [facility] [address] [place], [place] [address] [phone]

## 2016-03-18 NOTE — Telephone Encounter (Signed)
Pt wants you to call him and discuss any options vs generic Viagra.  [phone]

## 2016-03-19 MED ORDER — SILDENAFIL CITRATE 20 MG PO TABS: ORAL_TABLET | ORAL | Status: DC

## 2016-03-19 NOTE — Telephone Encounter (Signed)
Spoke with pt in reference to viagra. Pt stated that hes not sure if the medication works yet because when he went to pick up medication it was $1600. Made pt aware medication should be sildenafil and cost around $80-100. Sent medication to [facility] and made pt aware. Pt voiced understanding.

## 2016-03-19 NOTE — Telephone Encounter (Signed)
If the generic viagra does not work for him, the next step is injection therapy in which case he needs an appt to discuss this treatment.

## 2016-05-14 ENCOUNTER — Encounter (INDEPENDENT_AMBULATORY_CARE_PROVIDER_SITE_OTHER): Payer: BLUE CROSS/BLUE SHIELD | Admitting: Ophthalmology

## 2016-05-14 DIAGNOSIS — E113313 Type 2 diabetes mellitus with moderate nonproliferative diabetic retinopathy with macular edema, bilateral: Secondary | ICD-10-CM | POA: Diagnosis not present

## 2016-05-14 DIAGNOSIS — H4313 Vitreous hemorrhage, bilateral: Secondary | ICD-10-CM | POA: Diagnosis not present

## 2016-05-14 DIAGNOSIS — H2513 Age-related nuclear cataract, bilateral: Secondary | ICD-10-CM

## 2016-05-14 DIAGNOSIS — H35033 Hypertensive retinopathy, bilateral: Secondary | ICD-10-CM

## 2016-05-14 DIAGNOSIS — E11311 Type 2 diabetes mellitus with unspecified diabetic retinopathy with macular edema: Secondary | ICD-10-CM

## 2016-05-14 DIAGNOSIS — I10 Essential (primary) hypertension: Secondary | ICD-10-CM | POA: Diagnosis not present

## 2016-06-07 ENCOUNTER — Other Ambulatory Visit: Payer: Self-pay

## 2016-06-07 DIAGNOSIS — E1165 Type 2 diabetes mellitus with hyperglycemia: Principal | ICD-10-CM

## 2016-06-07 DIAGNOSIS — E1151 Type 2 diabetes mellitus with diabetic peripheral angiopathy without gangrene: Secondary | ICD-10-CM

## 2016-06-07 DIAGNOSIS — IMO0002 Reserved for concepts with insufficient information to code with codable children: Secondary | ICD-10-CM

## 2016-06-07 MED ORDER — SITAGLIPTIN PHOSPHATE 100 MG PO TABS: 100.0000 mg | ORAL_TABLET | Freq: Every day | ORAL | Status: DC

## 2016-06-07 NOTE — Telephone Encounter (Signed)
Last A1c reviewed from December 2016; diabetes out of control Last creatinine was done in May 2016, 0.8 No upcoming appointments Please call pt, ask him to schedule a visit for diabetes in the next few weeks; I'll send Rx as requested, but please come fasting for labs

## 2016-06-08 NOTE — Telephone Encounter (Signed)
Pt scheduled  

## 2016-06-17 ENCOUNTER — Encounter: Payer: Self-pay | Admitting: Urology

## 2016-06-17 ENCOUNTER — Ambulatory Visit (INDEPENDENT_AMBULATORY_CARE_PROVIDER_SITE_OTHER): Payer: BLUE CROSS/BLUE SHIELD | Admitting: Urology

## 2016-06-17 VITALS — BP 125/85 | HR 81 | Ht 70.0 in | Wt 276.0 lb

## 2016-06-17 DIAGNOSIS — E669 Obesity, unspecified: Secondary | ICD-10-CM | POA: Insufficient documentation

## 2016-06-17 DIAGNOSIS — N5201 Erectile dysfunction due to arterial insufficiency: Secondary | ICD-10-CM

## 2016-06-17 NOTE — Progress Notes (Signed)
06/17/2016 12:08 PM   Clarence Black 31-Mar-1966 [id number]  Referring provider: Bobetta Lime, MD [address] [place], [place] [address]  Chief Complaint  Patient presents with  . Follow-up    Erectile Dysfunction    HPI: The patient is a 50 year old male with uncontrolled diabetes (recent HgA1c of 11.8) who is following up for his hypogonadism and erectile dysfunction. At his last visit, he was started on Cialis 5 mg every other day and Androderm 4 mg per 24 hour patch daily.His primary care provider stop his Androderm secondary to his uncontrolled diabetes per the patient. In terms of erections, Cialis is unable to provide a sufficient erection for him. His Shim score today is 9 which indicates moderate erectile dysfunction. He would to try different medication.   His testosterone in late December 2016 was 199. PSA was 0.3. His hepatic panel and CBC were normal.  June 2017 interval history: The patient tried sildenafil 100 mg without success. He was unable to obtain an erection. He also failed Cialis previously.  PMH: Past Medical History  Diagnosis Date  . Diabetes mellitus without complication ([place])   . Hypertension   . High cholesterol   . Obesity   . Erectile dysfunction   . Hypogonadism, male   . Uncontrolled type 2 diabetes mellitus with peripheral circulatory disorder ([place]) 06/13/2015  . Hypertension goal BP (blood pressure) < 140/90 06/13/2015  . Erectile dysfunction associated with type 2 diabetes mellitus ([place]) 02/20/2015  . Type 2 diabetes mellitus ([place]) 10/01/2015  . Hyperlipidemia LDL goal <100 06/13/2015  . Obesity, Class III, BMI 40-49.9 (morbid obesity) ([place]) 02/20/2015    Surgical History: No past surgical history on file.  Home Medications:    Medication List       This list is accurate as of: 06/17/16 12:08 PM.  Always use your most recent med list.               amLODipine 10 MG tablet  Commonly known as:  NORVASC  Take 1 tablet  (10 mg total) by mouth daily.     atorvastatin 80 MG tablet  Commonly known as:  LIPITOR  Take 1 tablet (80 mg total) by mouth daily at 6 PM.     Blood Pressure Monitor Automat Devi  1 Device by Does not apply route daily.     empagliflozin 25 MG Tabs tablet  Commonly known as:  JARDIANCE  Take 25 mg by mouth daily.     glipiZIDE-metformin 2.5-500 MG tablet  Commonly known as:  METAGLIP  Take by mouth. Reported on 06/17/2016     glucose blood test strip  Commonly known as:  ONE TOUCH ULTRA TEST  Use as instructed     hydrochlorothiazide 50 MG tablet  Commonly known as:  HYDRODIURIL  Take 1 tablet (50 mg total) by mouth daily.     lisinopril 40 MG tablet  Commonly known as:  PRINIVIL,ZESTRIL  TAKE 1 TABLET (40 MG TOTAL) BY MOUTH DAILY.     meloxicam 15 MG tablet  Commonly known as:  MOBIC  TAKE 1 TABLET (15 MG TOTAL) BY MOUTH DAILY.     metFORMIN 1000 MG tablet  Commonly known as:  GLUCOPHAGE  Take 1 tablet (1,000 mg total) by mouth 2 (two) times daily with a meal.     neomycin-polymyxin-hydrocortisone otic solution  Commonly known as:  CORTISPORIN  Apply on to two drops to toe after soaking twice daily.     ONE TOUCH ULTRA 2  w/Device Kit  1 Device by Does not apply route 3 (three) times daily.     ONETOUCH DELICA LANCETS FINE Misc  1 Device by Does not apply route 3 (three) times daily.     sildenafil 20 MG tablet  Commonly known as:  REVATIO  Take 1-5 pills daily prn     sitaGLIPtin 100 MG tablet  Commonly known as:  JANUVIA  Take 1 tablet (100 mg total) by mouth daily.     TRADJENTA 5 MG Tabs tablet  Generic drug:  linagliptin  TAKE 1 TABLET BY MOUTH DAILY        Allergies: No Known Allergies  Family History: Family History  Problem Relation Age of Onset  . Diabetes Mother   . Kidney disease Mother     Social History:  reports that he has never smoked. He does not have any smokeless tobacco history on file. He reports that he does not drink  alcohol or use illicit drugs.  ROS: UROLOGY Frequent Urination?: Yes Hard to postpone urination?: No Burning/pain with urination?: No Get up at night to urinate?: Yes Leakage of urine?: No Urine stream starts and stops?: No Trouble starting stream?: No Do you have to strain to urinate?: No Blood in urine?: No Urinary tract infection?: No Sexually transmitted disease?: No Injury to kidneys or bladder?: No Painful intercourse?: No Weak stream?: No Erection problems?: Yes Penile pain?: No  Gastrointestinal Nausea?: No Vomiting?: No Indigestion/heartburn?: No Diarrhea?: No Constipation?: No  Constitutional Fever: No Night sweats?: No Weight loss?: No Fatigue?: No  Skin Skin rash/lesions?: No Itching?: No  Eyes Blurred vision?: No Double vision?: No  Ears/Nose/Throat Sore throat?: No Sinus problems?: No  Hematologic/Lymphatic Swollen glands?: No Easy bruising?: No  Cardiovascular Leg swelling?: No Chest pain?: No  Respiratory Cough?: No Shortness of breath?: No  Endocrine Excessive thirst?: No  Musculoskeletal Back pain?: No Joint pain?: No  Neurological Headaches?: No Dizziness?: No  Psychologic Depression?: No Anxiety?: No  Physical Exam: BP 125/85 mmHg  Pulse 81  Ht 5' 10"  (1.778 m)  Wt 276 lb (125.193 kg)  BMI 39.60 kg/m2  Constitutional:  Alert and oriented, No acute distress. HEENT: [place] AT, moist mucus membranes.  Trachea midline, no masses. Cardiovascular: No clubbing, cyanosis, or edema. Respiratory: Normal respiratory effort, no increased work of breathing. GI: Abdomen is soft, nontender, nondistended, no abdominal masses GU: No CVA tenderness.  Skin: No rashes, bruises or suspicious lesions. Lymph: No cervical or inguinal adenopathy. Neurologic: Grossly intact, no focal deficits, moving all 4 extremities. Psychiatric: Normal mood and affect.  Laboratory Data: Lab Results  Component Value Date   WBC 5.9 12/16/2015   HCT  45.9 12/16/2015   MCV 83 12/16/2015   PLT 165 12/16/2015    Lab Results  Component Value Date   CREATININE 0.8 05/27/2015    Lab Results  Component Value Date   PSA 0.3ng/dL 05/27/2015    Lab Results  Component Value Date   TESTOSTERONE 199* 12/16/2015    Lab Results  Component Value Date   HGBA1C 9.6* 12/16/2015    Urinalysis No results found for: COLORURINE, APPEARANCEUR, LABSPEC, PHURINE, GLUCOSEU, HGBUR, BILIRUBINUR, KETONESUR, PROTEINUR, UROBILINOGEN, NITRITE, LEUKOCYTESUR   Assessment & Plan:    1. Erectile dysfunction I discussed areas treatment method since he is failed oral therapy for erectile dysfunction. We discussed vacuum erection devices, MUSE, intracavernosal injections. He has elected to proceed with intracavernosal injections. We will order an Trimix plus supplies. He'll come to the office for  teaching after he obtains the medication. He was warned of the risk of priapism and the need for emergent intervention the emergency department if it were to occur.  2. Hypogonadism -Per the patient, his primary care provider does not want him on testosterone due to his uncontrolled diabetes. He is seeing a new primary care provider in the near future. If his primary care provider is okay with him started testosterone, we can restart his androgen replacement therapy.  Return in about 2 weeks (around 07/01/2016) for penile injection teaching.  Nickie Retort, MD  [facility] [address], [place], [place] [address] [phone]

## 2016-06-22 ENCOUNTER — Ambulatory Visit: Payer: BLUE CROSS/BLUE SHIELD | Admitting: Family Medicine

## 2016-07-02 ENCOUNTER — Ambulatory Visit: Payer: BLUE CROSS/BLUE SHIELD | Admitting: Urology

## 2016-07-02 ENCOUNTER — Telehealth: Payer: Self-pay | Admitting: Urology

## 2016-07-02 NOTE — Telephone Encounter (Signed)
Pt would like for you to give him a call.  He wants to speak with you about not doing injections.  [phone]

## 2016-07-05 ENCOUNTER — Other Ambulatory Visit: Payer: Self-pay | Admitting: Family Medicine

## 2016-07-05 NOTE — Telephone Encounter (Signed)
appt tomorrow; last Cr 0.8 one year ago; will get labs when he comes in for appt Review of med list shows that he is on tradjenta and [place] I called pharmacy to discuss; they have been dispensing both; they asked him a few times if he's supposed to be taking both and he said he was so they've been filling them both I told her I'm not approving Januvia I also noted metformin is on the list twice; he should not be getting duplicate metformin I called him, left detailed message; he should not be taking Januvia plus Tradjenta; he should not be taking both metformins Please bring pill bottles with him to appt and we'll go over them all together

## 2016-07-06 ENCOUNTER — Ambulatory Visit: Payer: BLUE CROSS/BLUE SHIELD | Admitting: Family Medicine

## 2016-07-07 NOTE — Telephone Encounter (Signed)
Spoke with patient and he does not want to start injections at this time. Patient states he wants to concentrate on weight loss and things before trying to start injections. I let patient know that I would let his provider know about his decision. Patient ok with plan.

## 2016-07-13 ENCOUNTER — Ambulatory Visit (INDEPENDENT_AMBULATORY_CARE_PROVIDER_SITE_OTHER): Payer: BLUE CROSS/BLUE SHIELD | Admitting: Family Medicine

## 2016-07-13 ENCOUNTER — Encounter: Payer: Self-pay | Admitting: Family Medicine

## 2016-07-13 VITALS — BP 132/86 | HR 102 | Temp 98.5°F | Resp 16 | Wt 275.0 lb

## 2016-07-13 DIAGNOSIS — I10 Essential (primary) hypertension: Secondary | ICD-10-CM

## 2016-07-13 DIAGNOSIS — E1151 Type 2 diabetes mellitus with diabetic peripheral angiopathy without gangrene: Secondary | ICD-10-CM

## 2016-07-13 DIAGNOSIS — Z5181 Encounter for therapeutic drug level monitoring: Secondary | ICD-10-CM

## 2016-07-13 DIAGNOSIS — E1165 Type 2 diabetes mellitus with hyperglycemia: Secondary | ICD-10-CM

## 2016-07-13 DIAGNOSIS — R220 Localized swelling, mass and lump, head: Secondary | ICD-10-CM

## 2016-07-13 DIAGNOSIS — R221 Localized swelling, mass and lump, neck: Secondary | ICD-10-CM | POA: Diagnosis not present

## 2016-07-13 DIAGNOSIS — IMO0002 Reserved for concepts with insufficient information to code with codable children: Secondary | ICD-10-CM

## 2016-07-13 MED ORDER — LIRAGLUTIDE 18 MG/3ML ~~LOC~~ SOPN: PEN_INJECTOR | SUBCUTANEOUS | Status: DC

## 2016-07-13 MED ORDER — AMLODIPINE BESYLATE 5 MG PO TABS: 5.0000 mg | ORAL_TABLET | Freq: Every day | ORAL | Status: DC

## 2016-07-13 MED ORDER — METFORMIN HCL ER 500 MG PO TB24: 2000.0000 mg | ORAL_TABLET | Freq: Every day | ORAL | Status: DC

## 2016-07-13 MED ORDER — HYDROCHLOROTHIAZIDE 25 MG PO TABS: 25.0000 mg | ORAL_TABLET | Freq: Every day | ORAL | Status: DC

## 2016-07-13 MED ORDER — EMPAGLIFLOZIN 25 MG PO TABS: 25.0000 mg | ORAL_TABLET | Freq: Every day | ORAL | Status: DC

## 2016-07-13 MED ORDER — ATORVASTATIN CALCIUM 80 MG PO TABS: 80.0000 mg | ORAL_TABLET | Freq: Every day | ORAL | Status: DC

## 2016-07-13 MED ORDER — LISINOPRIL 40 MG PO TABS: 40.0000 mg | ORAL_TABLET | Freq: Every day | ORAL | Status: DC

## 2016-07-13 NOTE — Patient Instructions (Signed)
Switch to the long-acting metformin Stop the medicines we noted Start the new injectable for diabetes Decrease the HCTZ to 25 mg daily Continue the amlodipine at 5 mg daily As you lose weight, we're going to start decreasing other medicines  Check out the information at familydoctor.org entitled "Nutrition for Weight Loss: What You Need to Know about Fad Diets" Try to lose between 1-2 pounds per week by taking in fewer calories and burning off more calories You can succeed by limiting portions, limiting foods dense in calories and fat, becoming more active, and drinking 8 glasses of water a day (64 ounces) Don't skip meals, especially breakfast, as skipping meals may alter your metabolism Do not use over-the-counter weight loss pills or gimmicks that claim rapid weight loss A healthy BMI (or body mass index) is between 18.5 and 24.9 You can calculate your ideal BMI at the [place] website [employer]fr

## 2016-07-13 NOTE — Progress Notes (Signed)
BP 132/86 mmHg  Pulse 102  Temp(Src) 98.5 F (36.9 C) (Oral)  Resp 16  Wt 275 lb (124.739 kg)  SpO2 95%   Subjective:    Patient ID: Clarence Black, male    DOB: December 27, 1966, 50 y.o.   MRN: [id number]  HPI: Clarence Black is a 50 y.o. male  Chief Complaint  Patient presents with  . Follow-up  . Cyst    left side  of head   Patient is here for follow-up of diabetes; he is brand new to me; see previous phone note about medicine duplications noted  Medicine reviewed Metformin - taking plain version, not the combo version meloxicam -- not taking metformin combo with sulf -- not taking Lisinopril - taking  Diabetes Mellitus Type II, Follow-up: Patient here for follow-up of Type 2 diabetes mellitus. He is not taking the two types of metformin; he is also not taking two DPP-4 inhibitors; he is obese  Hypertension: Patient here for follow-up of elevated blood pressure.  Obesity: Patient complains of obesity. Married at 225 pounds, wife says 180 pounds; differing opinion; gradual weight gain; did have some rapid weight gain  He has noticed a lump on the left side back of his head; no pain  Depression screen [facility] 2/9 07/13/2016 12/09/2015 09/12/2015 06/13/2015  Decreased Interest 0 0 0 0  Down, Depressed, Hopeless - 0 0 0  PHQ - 2 Score 0 0 0 0   Relevant past medical, surgical, family and social history reviewed Past Medical History  Diagnosis Date  . Diabetes mellitus without complication ([place])   . Hypertension   . High cholesterol   . Obesity   . Erectile dysfunction   . Hypogonadism, male   . Uncontrolled type 2 diabetes mellitus with peripheral circulatory disorder ([place]) 06/13/2015  . Hypertension goal BP (blood pressure) < 140/90 06/13/2015  . Erectile dysfunction associated with type 2 diabetes mellitus ([place]) 02/20/2015  . Type 2 diabetes mellitus ([place]) 10/01/2015  . Hyperlipidemia LDL goal <100 06/13/2015  . Obesity, Class III, BMI 40-49.9 (morbid obesity) ([place])  02/20/2015  . Hypertension goal BP (blood pressure) < 130/80 06/13/2015   History reviewed. No pertinent past surgical history. Family History  Problem Relation Age of Onset  . Diabetes Mother   . Kidney disease Mother   no known thyroid disease Weight is a problem in the family  Social History  Substance Use Topics  . Smoking status: Never Smoker   . Smokeless tobacco: None  . Alcohol Use: No   Interim medical history since last visit reviewed. Allergies and medications reviewed  Review of Systems Per HPI unless specifically indicated above     Objective:    BP 132/86 mmHg  Pulse 102  Temp(Src) 98.5 F (36.9 C) (Oral)  Resp 16  Wt 275 lb (124.739 kg)  SpO2 95%  Wt Readings from Last 3 Encounters:  07/13/16 275 lb (124.739 kg)  06/17/16 276 lb (125.193 kg)  03/18/16 284 lb 1.6 oz (128.867 kg)    Physical Exam  Constitutional: He appears well-developed and well-nourished. No distress.  HENT:  Head: Atraumatic.  Visible and palpable knot on the posterior aspect of the LEFT occiput; nontender; no overlying skin changes  Eyes: EOM are normal. No scleral icterus.  Neck: No thyromegaly present.  Cardiovascular: Normal rate and regular rhythm.   Pulmonary/Chest: Effort normal and breath sounds normal.  Abdominal: Soft. Bowel sounds are normal. He exhibits no distension.  Musculoskeletal: He exhibits no edema.  Lymphadenopathy:  He has no cervical adenopathy.  Neurological: He is alert. Coordination normal.  Skin: Skin is warm and dry. No pallor.  Psychiatric: He has a normal mood and affect. His behavior is normal. Judgment and thought content normal.   Diabetic Foot Form - Detailed   Diabetic Foot Exam - detailed  Diabetic Foot exam was performed with the following findings:  Yes 07/13/2016  1:25 PM  Visual Foot Exam completed.:  Yes  Are the toenails ingrown?:  No  Normal Range of Motion:  Yes    Pulse Foot Exam completed.:  Yes  Right Dorsalis Pedis:  Present  Left Dorsalis Pedis:  Present  Sensory Foot Exam Completed.:  Yes  Swelling:  No  Semmes-Weinstein Monofilament Test  R Site 1-Great Toe:  Pos L Site 1-Great Toe:  Pos  R Site 4:  Pos L Site 4:  Pos  R Site 5:  Pos L Site 5:  Pos          Assessment & Plan:   Problem List Items Addressed This Visit      Cardiovascular and Mediastinum   Uncontrolled type 2 diabetes mellitus with peripheral circulatory disorder (HCC) - Primary (Chronic)    Significant time reviewed medicines, making sure no duplications; since I'm starting GLP-1, will stop DPP-4; only take ONE metformin      Relevant Medications   metFORMIN (GLUCOPHAGE XR) 500 MG 24 hr tablet   lisinopril (PRINIVIL,ZESTRIL) 40 MG tablet   hydrochlorothiazide (HYDRODIURIL) 25 MG tablet   empagliflozin (JARDIANCE) 25 MG TABS tablet   aspirin EC 81 MG tablet   amLODipine (NORVASC) 5 MG tablet   Liraglutide 18 MG/3ML SOPN   Other Relevant Orders   Comprehensive Metabolic Panel (CMET) (Completed)   Hemoglobin A1c (Completed)   Microalbumin / creatinine urine ratio (Completed)   Lipid panel (Completed)   Hypertension goal BP (blood pressure) < 130/80    Work on weight loss, DASH guidelines      Relevant Medications   lisinopril (PRINIVIL,ZESTRIL) 40 MG tablet   hydrochlorothiazide (HYDRODIURIL) 25 MG tablet   aspirin EC 81 MG tablet   amLODipine (NORVASC) 5 MG tablet     Other   Medication monitoring encounter    Check CMP on medications      Relevant Orders   Comprehensive Metabolic Panel (CMET) (Completed)   Head or neck swelling, mass, or lump    Asymmetric; large for lymph node; no night sweats; will get [place]      Relevant Orders   US Soft Tissue Head/Neck   TSH (Completed)      Follow up plan: Return in about 6 weeks (around 08/24/2016).  An after-visit summary was printed and given to the patient at [facility].  Please see the patient instructions which may contain other information and recommendations beyond  what is mentioned above in the assessment and plan.  Meds ordered this encounter  Medications  . DISCONTD: JANUVIA 100 MG tablet    Sig: Take 100 mg by mouth daily.    Refill:  0  . DISCONTD: hydrochlorothiazide (HYDRODIURIL) 25 MG tablet    Sig: Take 1 tablet (25 mg total) by mouth daily.    Dispense:  90 tablet    Refill:  1    Cancel the 50 mg -- I'm decreasing the dose  . metFORMIN (GLUCOPHAGE XR) 500 MG 24 hr tablet    Sig: Take 4 tablets (2,000 mg total) by mouth daily. (for diabetes)    Dispense:  360 tablet  Refill:  1  . lisinopril (PRINIVIL,ZESTRIL) 40 MG tablet    Sig: Take 1 tablet (40 mg total) by mouth daily. (for kidney protection and blood pressure)    Dispense:  90 tablet    Refill:  1  . hydrochlorothiazide (HYDRODIURIL) 25 MG tablet    Sig: Take 1 tablet (25 mg total) by mouth daily. (for blood pressure)    Dispense:  90 tablet    Refill:  1    Cancel the 50 mg -- I'm decreasing the dose  . empagliflozin (JARDIANCE) 25 MG TABS tablet    Sig: Take 25 mg by mouth daily. (for diabetes)    Dispense:  90 tablet    Refill:  1  . DISCONTD: atorvastatin (LIPITOR) 80 MG tablet    Sig: Take 1 tablet (80 mg total) by mouth at bedtime.    Dispense:  90 tablet    Refill:  1    Discontinue 40 mg dose  . aspirin EC 81 MG tablet    Sig: Take 1 tablet (81 mg total) by mouth daily. To help prevent heart attack    Dispense:  90 tablet    Refill:  1  . amLODipine (NORVASC) 5 MG tablet    Sig: Take 1 tablet (5 mg total) by mouth daily. (for blood pressure)    Dispense:  90 tablet    Refill:  0    Using 5 mg daily  . Liraglutide 18 MG/3ML SOPN    Sig: Start with 0.6 mg subcutaneously once a day x 1 week, then 1.2 mg daily x 1 week, then 1.8 mg daily (for diabetes)    Dispense:  6 mL    Refill:  0   Orders Placed This Encounter  Procedures  . US Soft Tissue Head/Neck  . Comprehensive Metabolic Panel (CMET)  . Hemoglobin A1c  . Microalbumin / creatinine urine ratio    . TSH  . Lipid panel

## 2016-07-14 ENCOUNTER — Other Ambulatory Visit: Payer: Self-pay | Admitting: Family Medicine

## 2016-07-14 DIAGNOSIS — R17 Unspecified jaundice: Secondary | ICD-10-CM

## 2016-07-14 LAB — COMPREHENSIVE METABOLIC PANEL
ALBUMIN: 4.5 g/dL (ref 3.6–5.1)
ALK PHOS: 61 U/L (ref 40–115)
ALT: 23 U/L (ref 9–46)
AST: 29 U/L (ref 10–40)
BILIRUBIN TOTAL: 1.4 mg/dL — AB (ref 0.2–1.2)
BUN: 16 mg/dL (ref 7–25)
CO2: 25 mmol/L (ref 20–31)
CREATININE: 1.09 mg/dL (ref 0.60–1.35)
Calcium: 9.7 mg/dL (ref 8.6–10.3)
Chloride: 98 mmol/L (ref 98–110)
GLUCOSE: 156 mg/dL — AB (ref 65–99)
Potassium: 3.8 mmol/L (ref 3.5–5.3)
SODIUM: 141 mmol/L (ref 135–146)
TOTAL PROTEIN: 7 g/dL (ref 6.1–8.1)

## 2016-07-14 LAB — LIPID PANEL
CHOL/HDL RATIO: 2.7 ratio (ref <=5.0)
CHOLESTEROL: 130 mg/dL (ref 125–200)
HDL: 48 mg/dL (ref >=40)
LDL CALC: 58 mg/dL (ref <130)
Triglycerides: 122 mg/dL (ref <150)
VLDL: 24 mg/dL (ref <30)

## 2016-07-14 LAB — TSH: TSH: 1.09 m[IU]/L (ref 0.40–4.50)

## 2016-07-14 LAB — MICROALBUMIN / CREATININE URINE RATIO

## 2016-07-14 LAB — HEMOGLOBIN A1C
Hgb A1c MFr Bld: 8.7 % — ABNORMAL HIGH (ref <5.7)
Mean Plasma Glucose: 203 mg/dL

## 2016-07-16 ENCOUNTER — Encounter: Payer: Self-pay | Admitting: Family Medicine

## 2016-07-16 NOTE — Assessment & Plan Note (Signed)
Asymmetric; large for lymph node; no night sweats; will get [place]

## 2016-07-16 NOTE — Assessment & Plan Note (Signed)
Significant time reviewed medicines, making sure no duplications; since I'm starting GLP-1, will stop DPP-4; only take ONE metformin

## 2016-07-16 NOTE — Assessment & Plan Note (Signed)
Check CMP on medications

## 2016-07-16 NOTE — Assessment & Plan Note (Signed)
Work on weight loss, DASH guidelines 

## 2016-07-19 ENCOUNTER — Encounter (INDEPENDENT_AMBULATORY_CARE_PROVIDER_SITE_OTHER): Payer: BLUE CROSS/BLUE SHIELD | Admitting: Ophthalmology

## 2016-07-19 DIAGNOSIS — H2513 Age-related nuclear cataract, bilateral: Secondary | ICD-10-CM | POA: Diagnosis not present

## 2016-07-19 DIAGNOSIS — E11311 Type 2 diabetes mellitus with unspecified diabetic retinopathy with macular edema: Secondary | ICD-10-CM

## 2016-07-19 DIAGNOSIS — H43813 Vitreous degeneration, bilateral: Secondary | ICD-10-CM | POA: Diagnosis not present

## 2016-07-19 DIAGNOSIS — I10 Essential (primary) hypertension: Secondary | ICD-10-CM

## 2016-07-19 DIAGNOSIS — H35033 Hypertensive retinopathy, bilateral: Secondary | ICD-10-CM | POA: Diagnosis not present

## 2016-07-19 DIAGNOSIS — E113313 Type 2 diabetes mellitus with moderate nonproliferative diabetic retinopathy with macular edema, bilateral: Secondary | ICD-10-CM | POA: Diagnosis not present

## 2016-07-21 ENCOUNTER — Ambulatory Visit
Admission: RE | Admit: 2016-07-21 | Discharge: 2016-07-21 | Disposition: A | Discharge status: Home / Self Care | Payer: BLUE CROSS/BLUE SHIELD | Source: Ambulatory Visit | Attending: Family Medicine | Admitting: Family Medicine

## 2016-07-21 DIAGNOSIS — R220 Localized swelling, mass and lump, head: Secondary | ICD-10-CM | POA: Insufficient documentation

## 2016-07-22 ENCOUNTER — Telehealth: Payer: Self-pay

## 2016-07-22 NOTE — Telephone Encounter (Signed)
Pt needs rx for needles for the victoza

## 2016-07-23 ENCOUNTER — Encounter: Payer: Self-pay | Admitting: Urology

## 2016-07-23 ENCOUNTER — Other Ambulatory Visit: Payer: Self-pay | Admitting: Family Medicine

## 2016-07-23 ENCOUNTER — Ambulatory Visit: Payer: BLUE CROSS/BLUE SHIELD | Admitting: Urology

## 2016-07-23 DIAGNOSIS — R220 Localized swelling, mass and lump, head: Secondary | ICD-10-CM

## 2016-07-23 DIAGNOSIS — R221 Localized swelling, mass and lump, neck: Principal | ICD-10-CM

## 2016-07-23 MED ORDER — PEN NEEDLES 31G X 6 MM MISC: 2 refills | Status: DC

## 2016-07-23 NOTE — Assessment & Plan Note (Signed)
Suspected to be lipoma on [place], but "indeterminate"; will refer to surgeon

## 2016-07-23 NOTE — Telephone Encounter (Signed)
Pen needle Rx sent.

## 2016-07-27 ENCOUNTER — Encounter: Payer: Self-pay | Admitting: *Deleted

## 2016-07-29 ENCOUNTER — Ambulatory Visit: Payer: BLUE CROSS/BLUE SHIELD | Admitting: General Surgery

## 2016-08-02 ENCOUNTER — Ambulatory Visit (INDEPENDENT_AMBULATORY_CARE_PROVIDER_SITE_OTHER): Payer: BLUE CROSS/BLUE SHIELD | Admitting: General Surgery

## 2016-08-02 ENCOUNTER — Encounter: Payer: Self-pay | Admitting: General Surgery

## 2016-08-02 VITALS — BP 124/72 | HR 78 | Resp 12 | Ht 70.0 in | Wt 279.0 lb

## 2016-08-02 DIAGNOSIS — L729 Follicular cyst of the skin and subcutaneous tissue, unspecified: Secondary | ICD-10-CM

## 2016-08-02 NOTE — Progress Notes (Signed)
Patient ID: Clarence Black, male   DOB: February 01, 1966, 50 y.o.   MRN: [id number]  Chief Complaint  Patient presents with  . Other    cyst on head    HPI Clarence Black is a 50 y.o. male here today for an evaluation of a lump on his head. He states the lump has been present for 2 months and has decreased in size over that time. He denies pain, redness or discharge. He would like it removed. His wife was present for the duration of the visit. I have reviewed the history of present illness with the patient.   HPI  Past Medical History:  Diagnosis Date  . Diabetes mellitus without complication ([place])   . Erectile dysfunction   . Erectile dysfunction associated with type 2 diabetes mellitus ([place]) 02/20/2015  . High cholesterol   . Hyperlipidemia LDL goal <100 06/13/2015  . Hypertension   . Hypertension goal BP (blood pressure) < 130/80 06/13/2015  . Hypertension goal BP (blood pressure) < 140/90 06/13/2015  . Hypogonadism, male   . Obesity   . Obesity, Class III, BMI 40-49.9 (morbid obesity) ([place]) 02/20/2015  . Type 2 diabetes mellitus ([place]) 10/01/2015  . Uncontrolled type 2 diabetes mellitus with peripheral circulatory disorder ([place]) 06/13/2015    Past Surgical History:  Procedure Laterality Date  . COLONOSCOPY  2013    Family History  Problem Relation Age of Onset  . Diabetes Mother   . Kidney disease Mother     Social History Social History  Substance Use Topics  . Smoking status: Never Smoker  . Smokeless tobacco: Not on file  . Alcohol use No    No Known Allergies  Current Outpatient Prescriptions  Medication Sig Dispense Refill  . amLODipine (NORVASC) 5 MG tablet Take 1 tablet (5 mg total) by mouth daily. (for blood pressure) 90 tablet 0  . aspirin EC 81 MG tablet Take 1 tablet (81 mg total) by mouth daily. To help prevent heart attack 90 tablet 1  . atorvastatin (LIPITOR) 80 MG tablet Take 0.5 tablets (40 mg total) by mouth at bedtime.    . Blood Glucose Monitoring  Suppl (ONE TOUCH ULTRA 2) W/DEVICE KIT 1 Device by Does not apply route 3 (three) times daily. 1 each 0  . Blood Pressure Monitoring (BLOOD PRESSURE MONITOR AUTOMAT) DEVI 1 Device by Does not apply route daily. 1 Device 0  . empagliflozin (JARDIANCE) 25 MG TABS tablet Take 25 mg by mouth daily. (for diabetes) 90 tablet 1  . glucose blood (ONE TOUCH ULTRA TEST) test strip Use as instructed 300 each 3  . hydrochlorothiazide (HYDRODIURIL) 25 MG tablet Take 1 tablet (25 mg total) by mouth daily. (for blood pressure) 90 tablet 1  . Insulin Pen Needle (PEN NEEDLES) 31G X 6 MM MISC For use with injectable medicine, once a day 30 each 2  . Liraglutide 18 MG/3ML SOPN Start with 0.6 mg subcutaneously once a day x 1 week, then 1.2 mg daily x 1 week, then 1.8 mg daily (for diabetes) 6 mL 0  . lisinopril (PRINIVIL,ZESTRIL) 40 MG tablet Take 1 tablet (40 mg total) by mouth daily. (for kidney protection and blood pressure) 90 tablet 1  . metFORMIN (GLUCOPHAGE XR) 500 MG 24 hr tablet Take 4 tablets (2,000 mg total) by mouth daily. (for diabetes) 360 tablet 1  . ONETOUCH DELICA LANCETS FINE MISC 1 Device by Does not apply route 3 (three) times daily. 300 each 3  . sildenafil (REVATIO) 20 MG tablet Take  1-5 pills daily prn 90 tablet 11  . meloxicam (MOBIC) 15 MG tablet TAKE 1 TABLET (15 MG TOTAL) BY MOUTH DAILY. 15 tablet 0   No current facility-administered medications for this visit.     Review of Systems Review of Systems  Constitutional: Negative.   Respiratory: Negative.   Cardiovascular: Negative.     Blood pressure 124/72, pulse 78, resp. rate 12, height 5' 10" (1.778 m), weight 279 lb (126.6 kg).  Physical Exam Physical Exam  Constitutional: He is oriented to person, place, and time. He appears well-developed and well-nourished.  HENT:  Head:    Eyes: Conjunctivae are normal. No scleral icterus.  Neck: Neck supple.  Lymphadenopathy:    He has no cervical adenopathy.  Neurological: He is  alert and oriented to person, place, and time.  Skin: Skin is warm and dry.  Psychiatric: He has a normal mood and affect. His behavior is normal.    Data Reviewed Notes reviewed  Assessment    Epidermal skin cyst     Plan    Plan to schedule cyst excision at a later date.     This information has been scribed by Verlene Mayer, CMA     SANKAR,SEEPLAPUTHUR G 08/04/2016, 10:06 AM

## 2016-08-02 NOTE — Patient Instructions (Signed)
Epidermal Cyst An epidermal cyst is sometimes called a sebaceous cyst, epidermal inclusion cyst, or infundibular cyst. These cysts usually contain a substance that looks "pasty" or "cheesy" and may have a bad smell. This substance is a protein called keratin. Epidermal cysts are usually found on the face, neck, or trunk. They may also occur in the vaginal area or other parts of the genitalia of both men and women. Epidermal cysts are usually small, painless, slow-growing bumps or lumps that move freely under the skin. It is important not to try to pop them. This may cause an infection and lead to tenderness and swelling. CAUSES  Epidermal cysts may be caused by a deep penetrating injury to the skin or a plugged hair follicle, often associated with acne. SYMPTOMS  Epidermal cysts can become inflamed and cause:  Redness.  Tenderness.  Increased temperature of the skin over the bumps or lumps.  Grayish-white, bad smelling material that drains from the bump or lump. DIAGNOSIS  Epidermal cysts are easily diagnosed by your caregiver during an exam. Rarely, a tissue sample (biopsy) may be taken to rule out other conditions that may resemble epidermal cysts. TREATMENT   Epidermal cysts often get better and disappear on their own. They are rarely ever cancerous.  If a cyst becomes infected, it may become inflamed and tender. This may require opening and draining the cyst. Treatment with antibiotics may be necessary. When the infection is gone, the cyst may be removed with minor surgery.  Small, inflamed cysts can often be treated with antibiotics or by injecting steroid medicines.  Sometimes, epidermal cysts become large and bothersome. If this happens, surgical removal in your caregiver's office may be necessary. HOME CARE INSTRUCTIONS  Only take over-the-counter or prescription medicines as directed by your caregiver.  Take your antibiotics as directed. Finish them even if you start to feel  better. SEEK MEDICAL CARE IF:   Your cyst becomes tender, red, or swollen.  Your condition is not improving or is getting worse.  You have any other questions or concerns. MAKE SURE YOU:  Understand these instructions.  Will watch your condition.  Will get help right away if you are not doing well or get worse.   This information is not intended to replace advice given to you by your health care provider. Make sure you discuss any questions you have with your health care provider.   Document Released: 11/13/2004 Document Revised: 03/06/2012 Document Reviewed: 06/21/2011 Elsevier Interactive Patient Education 2016 Elsevier Inc.  

## 2016-08-03 ENCOUNTER — Other Ambulatory Visit: Payer: Self-pay | Admitting: Podiatry

## 2016-08-03 NOTE — Telephone Encounter (Signed)
Pt needs an appt prior to future refills. 

## 2016-08-04 ENCOUNTER — Encounter: Payer: Self-pay | Admitting: General Surgery

## 2016-08-08 ENCOUNTER — Other Ambulatory Visit: Payer: Self-pay | Admitting: Family Medicine

## 2016-08-08 MED ORDER — LIRAGLUTIDE 18 MG/3ML ~~LOC~~ SOPN: 1.8000 mg | PEN_INJECTOR | Freq: Every day | SUBCUTANEOUS | 5 refills | Status: DC

## 2016-08-16 ENCOUNTER — Encounter (INDEPENDENT_AMBULATORY_CARE_PROVIDER_SITE_OTHER): Payer: BLUE CROSS/BLUE SHIELD | Admitting: Ophthalmology

## 2016-08-16 DIAGNOSIS — H2513 Age-related nuclear cataract, bilateral: Secondary | ICD-10-CM | POA: Diagnosis not present

## 2016-08-16 DIAGNOSIS — H35033 Hypertensive retinopathy, bilateral: Secondary | ICD-10-CM | POA: Diagnosis not present

## 2016-08-16 DIAGNOSIS — E113313 Type 2 diabetes mellitus with moderate nonproliferative diabetic retinopathy with macular edema, bilateral: Secondary | ICD-10-CM | POA: Diagnosis not present

## 2016-08-16 DIAGNOSIS — H43813 Vitreous degeneration, bilateral: Secondary | ICD-10-CM | POA: Diagnosis not present

## 2016-08-16 DIAGNOSIS — E11311 Type 2 diabetes mellitus with unspecified diabetic retinopathy with macular edema: Secondary | ICD-10-CM | POA: Diagnosis not present

## 2016-08-16 DIAGNOSIS — I10 Essential (primary) hypertension: Secondary | ICD-10-CM

## 2016-08-19 ENCOUNTER — Encounter: Payer: BLUE CROSS/BLUE SHIELD | Admitting: Family Medicine

## 2016-08-23 ENCOUNTER — Encounter: Payer: Self-pay | Admitting: Family Medicine

## 2016-08-23 ENCOUNTER — Ambulatory Visit (INDEPENDENT_AMBULATORY_CARE_PROVIDER_SITE_OTHER): Payer: BLUE CROSS/BLUE SHIELD | Admitting: Family Medicine

## 2016-08-23 ENCOUNTER — Telehealth: Payer: Self-pay | Admitting: Family Medicine

## 2016-08-23 DIAGNOSIS — Z0000 Encounter for general adult medical examination without abnormal findings: Secondary | ICD-10-CM

## 2016-08-23 NOTE — Assessment & Plan Note (Addendum)
USPSTF grade A and B recommendations reviewed with patient; age-appropriate recommendations, preventive care, screening tests, etc discussed and encouraged; healthy living encouraged; see AVS for patient education given to patient; patient reports having had a colonoscopy done just last year; staff found date of 05/27/15; request report; he declined offer for pneumonia vaccine today; he will consider getting that and flu shot at next visit

## 2016-08-23 NOTE — Telephone Encounter (Signed)
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

## 2016-08-23 NOTE — Telephone Encounter (Signed)
Pt.notified

## 2016-08-23 NOTE — Progress Notes (Signed)
Patient ID: Clarence Black, male   DOB: September 14, 1966, 50 y.o.   MRN: [id number]   Subjective:   Clarence Black is a 50 y.o. 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 He was started on Jardiance and it's causing him to urinate too much, dry mouth; has to drive and inconvenient; back to [place]; however, he is on Victoza and will stop Tradjenta  USPSTF grade A and B recommendations Alcohol: no Depression: Depression screen [facility] 2/9 07/13/2016 12/09/2015 09/12/2015 06/13/2015  Decreased Interest 0 0 0 0  Down, Depressed, Hopeless - 0 0 0  PHQ - 2 Score 0 0 0 0   Not depressed  Hypertension: just a hair above normal Obesity: lost 3 pounds over 3 weeks, going to lose more Tobacco use: no HIV, hep B, hep C: declined STD testing and prevention (chl/gon/syphilis): declined Lipids: excellent Glucose: working on it Colorectal cancer: he thinks he had the colonoscopy Breast cancer: no lumps Lung cancer: n/a Osteoporosis: n/a AAA: n/a Aspirin: taking Diet: doing a pretty good Exercise: can do better Skin cancer: no worrisome moles  Past Medical History:  Diagnosis Date  . Diabetes mellitus without complication ([place])   . Erectile dysfunction   . Erectile dysfunction associated with type 2 diabetes mellitus ([place]) 02/20/2015  . High cholesterol   . Hyperlipidemia LDL goal <100 06/13/2015  . Hypertension   . Hypertension goal BP (blood pressure) < 130/80 06/13/2015  . Hypertension goal BP (blood pressure) < 140/90 06/13/2015  . Hypogonadism, male   . Obesity   . Obesity, Class III, BMI 40-49.9 (morbid obesity) ([place]) 02/20/2015  . Type 2 diabetes mellitus ([place]) 10/01/2015  . Uncontrolled type 2 diabetes mellitus with peripheral circulatory disorder ([place]) 06/13/2015   Past Surgical History:  Procedure Laterality Date  . COLONOSCOPY  2013   Family History  Problem Relation Age of Onset  . Diabetes Mother   .  Kidney disease Mother   no prostate cancer  Social History  Substance Use Topics  . Smoking status: Never Smoker  . Smokeless tobacco: Not on file  . Alcohol use No   Review of Systems  Respiratory: Negative for shortness of breath.   Cardiovascular: Negative for chest pain.  Gastrointestinal: Negative for blood in stool.  Genitourinary: Negative for hematuria.    Objective:   Vitals:   08/23/16 0807 08/23/16 0828  BP: 132/86 128/88  Pulse: 93   Resp: 14   Temp: 97.6 F (36.4 C)   TempSrc: Oral   SpO2: 95%   Weight: 276 lb (125.2 kg)    Body mass index is 39.6 kg/m. Wt Readings from Last 3 Encounters:  08/23/16 276 lb (125.2 kg)  08/02/16 279 lb (126.6 kg)  07/13/16 275 lb (124.7 kg)   Physical Exam  Constitutional: He appears well-developed and well-nourished. No distress.  HENT:  Head: Normocephalic and atraumatic.  Nose: Nose normal.  Mouth/Throat: Oropharynx is clear and moist.  Eyes: EOM are normal. No scleral icterus.  Neck: No JVD present. No thyromegaly present.  Cardiovascular: Normal rate, regular rhythm and normal heart sounds.   Pulmonary/Chest: Effort normal and breath sounds normal. No respiratory distress. He has no wheezes. He has no rales.  Abdominal: Soft. Bowel sounds are normal. He exhibits no distension. There is no tenderness. There is no guarding.  Musculoskeletal: Normal range of motion. He exhibits no edema.  Lymphadenopathy:    He has no cervical adenopathy.  Neurological: He is alert. He displays normal reflexes. He exhibits normal muscle tone. Coordination normal.  Skin: Skin is warm and dry. No rash noted. He is not diaphoretic. No erythema. No pallor.  Psychiatric: He has a normal mood and affect. His behavior is normal. Judgment and thought content normal.    Assessment/Plan:   Problem List Items Addressed This Visit      Other   Preventative health care    USPSTF grade A and B recommendations reviewed with patient;  age-appropriate recommendations, preventive care, screening tests, etc discussed and encouraged; healthy living encouraged; see AVS for patient education given to patient; patient reports having had a colonoscopy done just last year; staff found date of 05/27/15; request report; he declined offer for pneumonia vaccine today; he will consider getting that and flu shot at next visit       Other Visit Diagnoses   None.     Follow up plan: Return in about 7 weeks (around 10/14/2016) for diabetes; REQUEST colonoscopy report; 1 year for CPE.  An After Visit Summary was printed and given to the patient.

## 2016-08-23 NOTE — Patient Instructions (Addendum)
Stop the Tradjenta Keep titrating up on the Victoza  Build up execise gradually to 150 minutes per week  Health Maintenance, Male A healthy lifestyle and preventative care can promote health and wellness.  Maintain regular health, dental, and eye exams.  Eat a healthy diet. Foods like vegetables, fruits, whole grains, low-fat dairy products, and lean protein foods contain the nutrients you need and are low in calories. Decrease your intake of foods high in solid fats, added sugars, and salt. Get information about a proper diet from your health care provider, if necessary.  Regular physical exercise is one of the most important things you can do for your health. Most adults should get at least 150 minutes of moderate-intensity exercise (any activity that increases your heart rate and causes you to sweat) each week. In addition, most adults need muscle-strengthening exercises on 2 or more days a week.   Maintain a healthy weight. The body mass index (BMI) is a screening tool to identify possible weight problems. It provides an estimate of body fat based on height and weight. Your health care provider can find your BMI and can help you achieve or maintain a healthy weight. For males 20 years and older:  A BMI below 18.5 is considered underweight.  A BMI of 18.5 to 24.9 is normal.  A BMI of 25 to 29.9 is considered overweight.  A BMI of 30 and above is considered obese.  Maintain normal blood lipids and cholesterol by exercising and minimizing your intake of saturated fat. Eat a balanced diet with plenty of fruits and vegetables. Blood tests for lipids and cholesterol should begin at age 38 and be repeated every 5 years. If your lipid or cholesterol levels are high, you are over age 54, or you are at high risk for heart disease, you may need your cholesterol levels checked more frequently.Ongoing high lipid and cholesterol levels should be treated with medicines if diet and exercise are not  working.  If you smoke, find out from your health care provider how to quit. If you do not use tobacco, do not start.  Lung cancer screening is recommended for adults aged 20-80 years who are at high risk for developing lung cancer because of a history of smoking. A yearly low-dose CT scan of the lungs is recommended for people who have at least a 30-pack-year history of smoking and are current smokers or have quit within the past 15 years. A pack year of smoking is smoking an average of 1 pack of cigarettes a day for 1 year (for example, a 30-pack-year history of smoking could mean smoking 1 pack a day for 30 years or 2 packs a day for 15 years). Yearly screening should continue until the smoker has stopped smoking for at least 15 years. Yearly screening should be stopped for people who develop a health problem that would prevent them from having lung cancer treatment.  If you choose to drink alcohol, do not have more than 2 drinks per day. One drink is considered to be 12 oz (360 mL) of beer, 5 oz (150 mL) of wine, or 1.5 oz (45 mL) of liquor.  Avoid the use of street drugs. Do not share needles with anyone. Ask for help if you need support or instructions about stopping the use of drugs.  High blood pressure causes heart disease and increases the risk of stroke. High blood pressure is more likely to develop in:  People who have blood pressure in the end of  the normal range (100-139/85-89 mm Hg).  People who are overweight or obese.  People who are African American.  If you are 43-76 years of age, have your blood pressure checked every 3-5 years. If you are 64 years of age or older, have your blood pressure checked every year. You should have your blood pressure measured twice--once when you are at a hospital or clinic, and once when you are not at a hospital or clinic. Record the average of the two measurements. To check your blood pressure when you are not at a hospital or clinic, you can  use:  An automated blood pressure machine at a pharmacy.  A home blood pressure monitor.  If you are 67-29 years old, ask your health care provider if you should take aspirin to prevent heart disease.  Diabetes screening involves taking a blood sample to check your fasting blood sugar level. This should be done once every 3 years after age 12 if you are at a normal weight and without risk factors for diabetes. Testing should be considered at a younger age or be carried out more frequently if you are overweight and have at least 1 risk factor for diabetes.  Colorectal cancer can be detected and often prevented. Most routine colorectal cancer screening begins at the age of 59 and continues through age 54. However, your health care provider may recommend screening at an earlier age if you have risk factors for colon cancer. On a yearly basis, your health care provider may provide home test kits to check for hidden blood in the stool. A small camera at the end of a tube may be used to directly examine the colon (sigmoidoscopy or colonoscopy) to detect the earliest forms of colorectal cancer. Talk to your health care provider about this at age 59 when routine screening begins. A direct exam of the colon should be repeated every 5-10 years through age 7, unless early forms of precancerous polyps or small growths are found.  People who are at an increased risk for hepatitis B should be screened for this virus. You are considered at high risk for hepatitis B if:  You were born in a country where hepatitis B occurs often. Talk with your health care provider about which countries are considered high risk.  Your parents were born in a high-risk country and you have not received a shot to protect against hepatitis B (hepatitis B vaccine).  You have HIV or AIDS.  You use needles to inject street drugs.  You live with, or have sex with, someone who has hepatitis B.  You are a man who has sex with other  men (MSM).  You get hemodialysis treatment.  You take certain medicines for conditions like cancer, organ transplantation, and autoimmune conditions.  Hepatitis C blood testing is recommended for all people born from [birth year] through 1963 and any individual with known risk factors for hepatitis C.  Healthy men should no longer receive prostate-specific antigen (PSA) blood tests as part of routine cancer screening. Talk to your health care provider about prostate cancer screening.  Testicular cancer screening is not recommended for adolescents or adult males who have no symptoms. Screening includes self-exam, a health care provider exam, and other screening tests. Consult with your health care provider about any symptoms you have or any concerns you have about testicular cancer.  Practice safe sex. Use condoms and avoid high-risk sexual practices to reduce the spread of sexually transmitted infections (STIs).  You should be screened  for STIs, including gonorrhea and chlamydia if:  You are sexually active and are younger than 24 years.  You are older than 24 years, and your health care provider tells you that you are at risk for this type of infection.  Your sexual activity has changed since you were last screened, and you are at an increased risk for chlamydia or gonorrhea. Ask your health care provider if you are at risk.  If you are at risk of being infected with HIV, it is recommended that you take a prescription medicine daily to prevent HIV infection. This is called pre-exposure prophylaxis (PrEP). You are considered at risk if:  You are a man who has sex with other men (MSM).  You are a heterosexual man who is sexually active with multiple partners.  You take drugs by injection.  You are sexually active with a partner who has HIV.  Talk with your health care provider about whether you are at high risk of being infected with HIV. If you choose to begin PrEP, you should first be tested  for HIV. You should then be tested every 3 months for as long as you are taking PrEP.  Use sunscreen. Apply sunscreen liberally and repeatedly throughout the day. You should seek shade when your shadow is shorter than you. Protect yourself by wearing long sleeves, pants, a wide-brimmed hat, and sunglasses year round whenever you are outdoors.  Tell your health care provider of new moles or changes in moles, especially if there is a change in shape or color. Also, tell your health care provider if a mole is larger than the size of a pencil eraser.  A one-time screening for abdominal aortic aneurysm (AAA) and surgical repair of large AAAs by ultrasound is recommended for men aged 32-75 years who are current or former smokers.  Stay current with your vaccines (immunizations).   This information is not intended to replace advice given to you by your health care provider. Make sure you discuss any questions you have with your health care provider.   Document Released: 06/10/2008 Document Revised: 01/03/2015 Document Reviewed: 05/10/2011 Elsevier Interactive Patient Education [employer].

## 2016-08-26 ENCOUNTER — Other Ambulatory Visit: Payer: Self-pay | Admitting: Podiatry

## 2016-08-31 ENCOUNTER — Other Ambulatory Visit: Payer: Self-pay

## 2016-08-31 MED ORDER — LIRAGLUTIDE 18 MG/3ML ~~LOC~~ SOPN: 1.8000 mg | PEN_INJECTOR | Freq: Every day | SUBCUTANEOUS | 1 refills | Status: DC

## 2016-08-31 NOTE — Telephone Encounter (Signed)
Pt needs 90 day supply

## 2016-08-31 NOTE — Telephone Encounter (Signed)
okay

## 2016-09-01 ENCOUNTER — Other Ambulatory Visit: Payer: Self-pay

## 2016-09-01 MED ORDER — LIRAGLUTIDE 18 MG/3ML ~~LOC~~ SOPN: 1.8000 mg | PEN_INJECTOR | Freq: Every day | SUBCUTANEOUS | 1 refills | Status: DC

## 2016-09-01 NOTE — Telephone Encounter (Signed)
Ins requesting 90 day 

## 2016-09-01 NOTE — Telephone Encounter (Signed)
I believe 15 pens will be 90 day supply; rx sent

## 2016-09-02 ENCOUNTER — Other Ambulatory Visit: Payer: Self-pay

## 2016-09-02 ENCOUNTER — Telehealth: Payer: Self-pay

## 2016-09-02 MED ORDER — METFORMIN HCL ER 500 MG PO TB24: 2000.0000 mg | ORAL_TABLET | Freq: Every day | ORAL | 1 refills | Status: DC

## 2016-09-02 NOTE — Telephone Encounter (Signed)
Pt needs refill on one touch ultra test strips. Isn't on med list.

## 2016-09-02 NOTE — Telephone Encounter (Signed)
Last Cr reviewed; Rx approved 

## 2016-09-03 NOTE — Telephone Encounter (Signed)
He sees Dr. Gabriel Carina so please ask him to get all diabetes-related medicines and supplies from her office Thank you

## 2016-09-03 NOTE — Telephone Encounter (Signed)
Left detailed voicemail

## 2016-09-08 ENCOUNTER — Telehealth: Payer: Self-pay | Admitting: Family Medicine

## 2016-09-08 ENCOUNTER — Other Ambulatory Visit: Payer: Self-pay | Admitting: Podiatry

## 2016-09-08 ENCOUNTER — Other Ambulatory Visit: Payer: Self-pay

## 2016-09-08 NOTE — Telephone Encounter (Signed)
Pt needs 90 day supply

## 2016-09-08 NOTE — Telephone Encounter (Signed)
The last Rx was supposed to be a 90 day supply I prescribed 15 pens at a time My understanding is 5 pens in one month, so 15 pens would be 3 months Please resolve with pharmacy; okay to give 90 day supply with 1 refills (6 months total)

## 2016-09-08 NOTE — Telephone Encounter (Signed)
I really think 15 pens ought to be at least a 90 day supply Please check with pharmacy and give okay for 3 month supply with 1 refill (6 months total) Thank you

## 2016-09-09 NOTE — Telephone Encounter (Signed)
Pt and pharmacy notified.

## 2016-09-13 ENCOUNTER — Encounter (INDEPENDENT_AMBULATORY_CARE_PROVIDER_SITE_OTHER): Payer: BLUE CROSS/BLUE SHIELD | Admitting: Ophthalmology

## 2016-09-13 DIAGNOSIS — E113313 Type 2 diabetes mellitus with moderate nonproliferative diabetic retinopathy with macular edema, bilateral: Secondary | ICD-10-CM

## 2016-09-13 DIAGNOSIS — H43813 Vitreous degeneration, bilateral: Secondary | ICD-10-CM | POA: Diagnosis not present

## 2016-09-13 DIAGNOSIS — E11311 Type 2 diabetes mellitus with unspecified diabetic retinopathy with macular edema: Secondary | ICD-10-CM

## 2016-09-13 DIAGNOSIS — I10 Essential (primary) hypertension: Secondary | ICD-10-CM

## 2016-09-13 DIAGNOSIS — H35033 Hypertensive retinopathy, bilateral: Secondary | ICD-10-CM

## 2016-09-13 DIAGNOSIS — H2512 Age-related nuclear cataract, left eye: Secondary | ICD-10-CM

## 2016-09-16 ENCOUNTER — Encounter: Payer: Self-pay | Admitting: General Surgery

## 2016-09-16 ENCOUNTER — Ambulatory Visit (INDEPENDENT_AMBULATORY_CARE_PROVIDER_SITE_OTHER): Payer: BLUE CROSS/BLUE SHIELD | Admitting: General Surgery

## 2016-09-16 VITALS — BP 136/82 | HR 74 | Resp 12 | Ht 70.0 in | Wt 281.0 lb

## 2016-09-16 DIAGNOSIS — L729 Follicular cyst of the skin and subcutaneous tissue, unspecified: Secondary | ICD-10-CM | POA: Diagnosis not present

## 2016-09-16 DIAGNOSIS — D170 Benign lipomatous neoplasm of skin and subcutaneous tissue of head, face and neck: Secondary | ICD-10-CM | POA: Diagnosis not present

## 2016-09-16 NOTE — Progress Notes (Signed)
Patient ID: Clarence Black, male   DOB: 1966-02-27, 50 y.o.   MRN: [id number]  Chief Complaint  Patient presents with  . Procedure    HPI Clarence Black is a 50 y.o. male.  Here today for excision cyst left side of head.  I have reviewed the history of present illness with the patient.  HPI  Past Medical History:  Diagnosis Date  . Diabetes mellitus without complication ([place])   . Erectile dysfunction   . Erectile dysfunction associated with type 2 diabetes mellitus ([place]) 02/20/2015  . High cholesterol   . Hyperlipidemia LDL goal <100 06/13/2015  . Hypertension   . Hypertension goal BP (blood pressure) < 130/80 06/13/2015  . Hypertension goal BP (blood pressure) < 140/90 06/13/2015  . Hypogonadism, male   . Obesity   . Obesity, Class III, BMI 40-49.9 (morbid obesity) ([place]) 02/20/2015  . Type 2 diabetes mellitus ([place]) 10/01/2015  . Uncontrolled type 2 diabetes mellitus with peripheral circulatory disorder ([place]) 06/13/2015    Past Surgical History:  Procedure Laterality Date  . COLONOSCOPY  2013    Family History  Problem Relation Age of Onset  . Diabetes Mother   . Kidney disease Mother     Social History Social History  Substance Use Topics  . Smoking status: Never Smoker  . Smokeless tobacco: Not on file  . Alcohol use No    No Known Allergies  Current Outpatient Prescriptions  Medication Sig Dispense Refill  . amLODipine (NORVASC) 5 MG tablet Take 1 tablet (5 mg total) by mouth daily. (for blood pressure) 90 tablet 0  . aspirin EC 81 MG tablet Take 1 tablet (81 mg total) by mouth daily. To help prevent heart attack 90 tablet 1  . atorvastatin (LIPITOR) 80 MG tablet Take 0.5 tablets (40 mg total) by mouth at bedtime.    . Blood Glucose Monitoring Suppl (ONE TOUCH ULTRA 2) W/DEVICE KIT 1 Device by Does not apply route 3 (three) times daily. 1 each 0  . Blood Pressure Monitoring (BLOOD PRESSURE MONITOR AUTOMAT) DEVI 1 Device by Does not apply route daily. 1 Device 0   . glucose blood (ONE TOUCH ULTRA TEST) test strip Use as instructed 300 each 3  . hydrochlorothiazide (HYDRODIURIL) 25 MG tablet Take 1 tablet (25 mg total) by mouth daily. (for blood pressure) 90 tablet 1  . Insulin Pen Needle (PEN NEEDLES) 31G X 6 MM MISC For use with injectable medicine, once a day 30 each 2  . Liraglutide (VICTOZA) 18 MG/3ML SOPN Inject 0.3 mLs (1.8 mg total) into the skin daily. 15 pen 1  . lisinopril (PRINIVIL,ZESTRIL) 40 MG tablet Take 1 tablet (40 mg total) by mouth daily. (for kidney protection and blood pressure) 90 tablet 1  . meloxicam (MOBIC) 15 MG tablet TAKE 1 TABLET (15 MG TOTAL) BY MOUTH DAILY. 15 tablet 0  . metFORMIN (GLUCOPHAGE XR) 500 MG 24 hr tablet Take 4 tablets (2,000 mg total) by mouth daily. (for diabetes) 360 tablet 1  . ONETOUCH DELICA LANCETS FINE MISC 1 Device by Does not apply route 3 (three) times daily. 300 each 3  . sildenafil (REVATIO) 20 MG tablet Take 1-5 pills daily prn 90 tablet 11   No current facility-administered medications for this visit.     Review of Systems Review of Systems  Constitutional: Negative.   Respiratory: Negative.   Cardiovascular: Negative.     Blood pressure 136/82, pulse 74, resp. rate 12, height '5\' 10"'$  (1.778 m), weight 281 lb (127.5 kg).  Physical Exam Physical Exam  Constitutional: He is oriented to person, place, and time. He appears well-developed and well-nourished.  Neurological: He is alert and oriented to person, place, and time.  Skin: Skin is warm and dry.  Psychiatric: His behavior is normal.    Data Reviewed Prior note  Assessment   Lipoma left postauricular area    Plan    Procedure note. Procedure excision lipoma left postauricular area  Anesthetic: 10 ml of 0.5% marcaine and 1% xylocaine.  Prep: Choloroprep  Description: After anesthetic the area was prepped and draped in sterile fashion. 2.5 cm transverse incision was made in the subcutaneous tissue a lipoma was encountered  which was somewhat adherent to the rest of the subcutaneous tissue and required sharp dissection to excise it completely. Bleeding controlled with cautery and a suture ligature 3-0 vicryl. Skin was approximated with interrupted 3-0 nylon sutures, vertical simple and mattress. Dressing with neosporin ointment and gauze.    Return in 10 days for suture removal.  This information has been scribed by Clarence Fetch RN, BSN,BC.   Clarence Black 09/16/2016, 4:40 PM

## 2016-09-16 NOTE — Patient Instructions (Addendum)
The patient is aware to call back for any questions or concerns. Keep area clean May use an ice pack on and off today for comfort

## 2016-09-21 ENCOUNTER — Telehealth: Payer: Self-pay | Admitting: *Deleted

## 2016-09-21 NOTE — Telephone Encounter (Signed)
Notified patient as instructed, patient pleased. Discussed follow-up appointments, patient agrees  

## 2016-09-21 NOTE — Telephone Encounter (Signed)
-----   Message from Christene Lye, MD sent at 09/20/2016  3:21 PM EDT ----- Rosann Auerbach, please let pt pt know the pathology was normal.

## 2016-09-27 ENCOUNTER — Ambulatory Visit (INDEPENDENT_AMBULATORY_CARE_PROVIDER_SITE_OTHER): Payer: BLUE CROSS/BLUE SHIELD | Admitting: *Deleted

## 2016-09-27 DIAGNOSIS — L729 Follicular cyst of the skin and subcutaneous tissue, unspecified: Secondary | ICD-10-CM

## 2016-09-27 NOTE — Progress Notes (Signed)
Patient came in today for a wound check. Sutures removed, steri strips placed. The wound is clean, with no signs of infection noted. Follow up as scheduled.

## 2016-10-04 ENCOUNTER — Telehealth: Payer: Self-pay

## 2016-10-04 ENCOUNTER — Encounter (INDEPENDENT_AMBULATORY_CARE_PROVIDER_SITE_OTHER): Payer: BLUE CROSS/BLUE SHIELD | Admitting: Ophthalmology

## 2016-10-04 DIAGNOSIS — IMO0002 Reserved for concepts with insufficient information to code with codable children: Secondary | ICD-10-CM

## 2016-10-04 DIAGNOSIS — E113313 Type 2 diabetes mellitus with moderate nonproliferative diabetic retinopathy with macular edema, bilateral: Secondary | ICD-10-CM | POA: Diagnosis not present

## 2016-10-04 DIAGNOSIS — H35033 Hypertensive retinopathy, bilateral: Secondary | ICD-10-CM

## 2016-10-04 DIAGNOSIS — H43813 Vitreous degeneration, bilateral: Secondary | ICD-10-CM

## 2016-10-04 DIAGNOSIS — I10 Essential (primary) hypertension: Secondary | ICD-10-CM | POA: Diagnosis not present

## 2016-10-04 DIAGNOSIS — E11311 Type 2 diabetes mellitus with unspecified diabetic retinopathy with macular edema: Secondary | ICD-10-CM | POA: Diagnosis not present

## 2016-10-04 DIAGNOSIS — E1165 Type 2 diabetes mellitus with hyperglycemia: Principal | ICD-10-CM

## 2016-10-04 DIAGNOSIS — E1151 Type 2 diabetes mellitus with diabetic peripheral angiopathy without gangrene: Secondary | ICD-10-CM

## 2016-10-04 DIAGNOSIS — E119 Type 2 diabetes mellitus without complications: Secondary | ICD-10-CM

## 2016-10-04 NOTE — Telephone Encounter (Signed)
Pt is requesting Tradjenta 5 MG TAB  90 OTY but it's not on his Medication list. Last refill  07-01-2016.

## 2016-10-07 NOTE — Telephone Encounter (Signed)
I see now that Dr. Gabriel Carina is his endocrinologist All diabetes-related medicines, questions, supplies need to be addressed by that office I'll mark his problem list "overview" clearly that the endocrinologist is managing his diabetes

## 2016-10-08 ENCOUNTER — Encounter (INDEPENDENT_AMBULATORY_CARE_PROVIDER_SITE_OTHER): Payer: BLUE CROSS/BLUE SHIELD | Admitting: Ophthalmology

## 2016-10-08 NOTE — Telephone Encounter (Signed)
Pt.notified

## 2016-10-11 ENCOUNTER — Encounter (INDEPENDENT_AMBULATORY_CARE_PROVIDER_SITE_OTHER): Payer: BLUE CROSS/BLUE SHIELD | Admitting: Ophthalmology

## 2016-10-12 ENCOUNTER — Other Ambulatory Visit: Payer: Self-pay | Admitting: Family Medicine

## 2016-10-12 ENCOUNTER — Ambulatory Visit: Payer: BLUE CROSS/BLUE SHIELD | Admitting: Family Medicine

## 2016-10-12 ENCOUNTER — Other Ambulatory Visit: Payer: Self-pay | Admitting: Podiatry

## 2016-10-13 NOTE — Telephone Encounter (Signed)
rx approved

## 2016-10-22 ENCOUNTER — Ambulatory Visit (INDEPENDENT_AMBULATORY_CARE_PROVIDER_SITE_OTHER): Payer: BLUE CROSS/BLUE SHIELD | Admitting: Family Medicine

## 2016-10-22 ENCOUNTER — Other Ambulatory Visit
Admission: RE | Admit: 2016-10-22 | Discharge: 2016-10-22 | Disposition: A | Discharge status: Home / Self Care | Payer: BLUE CROSS/BLUE SHIELD | Source: Ambulatory Visit | Attending: Family Medicine | Admitting: Family Medicine

## 2016-10-22 ENCOUNTER — Encounter: Payer: Self-pay | Admitting: Family Medicine

## 2016-10-22 VITALS — BP 142/94 | HR 96 | Temp 97.8°F | Resp 14 | Wt 294.0 lb

## 2016-10-22 DIAGNOSIS — R0601 Orthopnea: Secondary | ICD-10-CM | POA: Insufficient documentation

## 2016-10-22 DIAGNOSIS — IMO0002 Reserved for concepts with insufficient information to code with codable children: Secondary | ICD-10-CM

## 2016-10-22 DIAGNOSIS — E785 Hyperlipidemia, unspecified: Secondary | ICD-10-CM | POA: Diagnosis present

## 2016-10-22 DIAGNOSIS — Z23 Encounter for immunization: Secondary | ICD-10-CM

## 2016-10-22 DIAGNOSIS — E1151 Type 2 diabetes mellitus with diabetic peripheral angiopathy without gangrene: Secondary | ICD-10-CM | POA: Diagnosis not present

## 2016-10-22 DIAGNOSIS — R7989 Other specified abnormal findings of blood chemistry: Secondary | ICD-10-CM | POA: Insufficient documentation

## 2016-10-22 DIAGNOSIS — R635 Abnormal weight gain: Secondary | ICD-10-CM | POA: Insufficient documentation

## 2016-10-22 DIAGNOSIS — Z5181 Encounter for therapeutic drug level monitoring: Secondary | ICD-10-CM | POA: Diagnosis not present

## 2016-10-22 DIAGNOSIS — E1165 Type 2 diabetes mellitus with hyperglycemia: Secondary | ICD-10-CM | POA: Diagnosis not present

## 2016-10-22 DIAGNOSIS — E291 Testicular hypofunction: Secondary | ICD-10-CM | POA: Diagnosis not present

## 2016-10-22 LAB — CBC WITH DIFFERENTIAL/PLATELET
Basophils Absolute: 0 10*3/uL (ref 0–0.1)
Basophils Relative: 0 %
EOS ABS: 0.1 10*3/uL (ref 0–0.7)
EOS PCT: 1 %
HCT: 44.3 % (ref 40.0–52.0)
Hemoglobin: 15.1 g/dL (ref 13.0–18.0)
LYMPHS ABS: 2.3 10*3/uL (ref 1.0–3.6)
Lymphocytes Relative: 33 %
MCH: 28.7 pg (ref 26.0–34.0)
MCHC: 34 g/dL (ref 32.0–36.0)
MCV: 84.2 fL (ref 80.0–100.0)
MONOS PCT: 8 %
Monocytes Absolute: 0.6 10*3/uL (ref 0.2–1.0)
Neutro Abs: 4.2 10*3/uL (ref 1.4–6.5)
Neutrophils Relative %: 58 %
PLATELETS: 156 10*3/uL (ref 150–440)
RBC: 5.26 MIL/uL (ref 4.40–5.90)
RDW: 14.9 % — AB (ref 11.5–14.5)
WBC: 7.2 10*3/uL (ref 3.8–10.6)

## 2016-10-22 LAB — LIPID PANEL
CHOLESTEROL: 130 mg/dL (ref 0–200)
HDL: 42 mg/dL (ref >40)
LDL Cholesterol: 62 mg/dL (ref 0–99)
Total CHOL/HDL Ratio: 3.1 RATIO
Triglycerides: 132 mg/dL (ref <150)
VLDL: 26 mg/dL (ref 0–40)

## 2016-10-22 LAB — COMPREHENSIVE METABOLIC PANEL
ALBUMIN: 4.3 g/dL (ref 3.5–5.0)
ALT: 30 U/L (ref 17–63)
AST: 30 U/L (ref 15–41)
Alkaline Phosphatase: 66 U/L (ref 38–126)
Anion gap: 7 (ref 5–15)
BUN: 12 mg/dL (ref 6–20)
CHLORIDE: 103 mmol/L (ref 101–111)
CO2: 27 mmol/L (ref 22–32)
CREATININE: 0.87 mg/dL (ref 0.61–1.24)
Calcium: 9.1 mg/dL (ref 8.9–10.3)
GFR calc non Af Amer: >60 mL/min (ref >60)
GLUCOSE: 102 mg/dL — AB (ref 65–99)
Potassium: 3.4 mmol/L — ABNORMAL LOW (ref 3.5–5.1)
SODIUM: 137 mmol/L (ref 135–145)
Total Bilirubin: 0.9 mg/dL (ref 0.3–1.2)
Total Protein: 7.1 g/dL (ref 6.5–8.1)

## 2016-10-22 LAB — T4, FREE: FREE T4: 0.77 ng/dL (ref 0.61–1.12)

## 2016-10-22 LAB — BRAIN NATRIURETIC PEPTIDE: B Natriuretic Peptide: 4 pg/mL (ref 0.0–100.0)

## 2016-10-22 LAB — TSH: TSH: 2.118 u[IU]/mL (ref 0.350–4.500)

## 2016-10-22 NOTE — Progress Notes (Signed)
BP (!) 142/94   Pulse 96   Temp 97.8 F (36.6 C) (Oral)   Resp 14   Wt 294 lb (133.4 kg)   SpO2 96%   BMI 42.18 kg/m   Recheck: 142/94  Subjective:    Patient ID: Clarence Black, male    DOB: [birth year], 50 y.o.   MRN: [id number]  HPI: Clarence Black is a 50 y.o. male  Chief Complaint  Patient presents with  . Follow-up   Patient is here for follow-up, accompanied by his wife  He has Type 2 diabetes 170, 255, 112, 87 (got a little shaky), 279, 82 (shaky), 184, 77 (shaky), 159, 141, 182, 116, 117, 110, 142, 145 Getting monthly eye treatments Started victoza but did not help the appetite He maybe didn't watch his diet as closely thinking the shot would help him lose weight His wife says that maybe he ate more  Obesity; really gaining weight; mother has heart failure; really picking up weight the last 3 weeks; a little SHOB; cannot lay flat in the bed, usually three pillows; some swelling in the abdomen; some swelling in the legs, but nothing out of the ordinary  Functions best with 5-6 hours of sleep; too much sleep and he gets groggy; some snoring; no stopping breathing  Androderm 4 mg per day, patch has been used by him in the past and he wants to know if that is something that I could prescribe for him; he is not seeing urologist any more  Depression screen [facility] 2/9 10/22/2016 07/13/2016 12/09/2015 09/12/2015 06/13/2015  Decreased Interest 0 0 0 0 0  Down, Depressed, Hopeless 0 - 0 0 0  PHQ - 2 Score 0 0 0 0 0   Relevant past medical, surgical, family and social history reviewed Past Medical History:  Diagnosis Date  . Diabetes mellitus without complication ([place])   . Erectile dysfunction   . Erectile dysfunction associated with type 2 diabetes mellitus ([place]) 02/20/2015  . High cholesterol   . Hyperlipidemia LDL goal <100 06/13/2015  . Hypertension   . Hypertension goal BP (blood pressure) < 130/80 06/13/2015  . Hypertension goal BP (blood pressure) < 140/90 06/13/2015   . Hypogonadism, male   . Obesity   . Obesity, Class III, BMI 40-49.9 (morbid obesity) ([place]) 02/20/2015  . Type 2 diabetes mellitus ([place]) 10/01/2015  . Uncontrolled type 2 diabetes mellitus with peripheral circulatory disorder ([place]) 06/13/2015   Past Surgical History:  Procedure Laterality Date  . COLONOSCOPY  2013   Family History  Problem Relation Age of Onset  . Diabetes Mother   . Kidney disease Mother    Social History  Substance Use Topics  . Smoking status: Never Smoker  . Smokeless tobacco: Never Used  . Alcohol use No   Interim medical history since last visit reviewed. Allergies and medications reviewed  Review of Systems Per HPI unless specifically indicated above     Objective:    BP (!) 142/94   Pulse 96   Temp 97.8 F (36.6 C) (Oral)   Resp 14   Wt 294 lb (133.4 kg)   SpO2 96%   BMI 42.18 kg/m   Wt Readings from Last 3 Encounters:  10/22/16 294 lb (133.4 kg)  09/16/16 281 lb (127.5 kg)  08/23/16 276 lb (125.2 kg)    Physical Exam  Constitutional: He appears well-developed and well-nourished. No distress.  Morbidly obese; weight gain 18 pounds over last 2 months  HENT:  Head: Atraumatic.  Eyes: EOM are  normal. No scleral icterus.  Neck: No JVD present. No thyromegaly present.  Cardiovascular: Normal rate and regular rhythm.   Pulmonary/Chest: Effort normal and breath sounds normal.  Abdominal: Soft. Bowel sounds are normal. He exhibits no distension.  Musculoskeletal: He exhibits no edema.  Lymphadenopathy:    He has no cervical adenopathy.  Neurological: He is alert. Coordination normal.  Skin: Skin is warm and dry. No pallor.  Psychiatric: He has a normal mood and affect. His behavior is normal. Judgment and thought content normal.   Diabetic Foot Form - Detailed   Diabetic Foot Exam - detailed Diabetic Foot exam was performed with the following findings:  Yes 10/22/2016  5:49 PM  Visual Foot Exam completed.:  Yes  Are the toenails  ingrown?:  No Normal Range of Motion:  Yes Pulse Foot Exam completed.:  Yes  Right Dorsalis Pedis:  Present Left Dorsalis Pedis:  Present  Sensory Foot Exam Completed.:  Yes Swelling:  No Semmes-Weinstein Monofilament Test R Site 1-Great Toe:  Pos L Site 1-Great Toe:  Pos  R Site 4:  Pos L Site 4:  Pos  R Site 5:  Pos L Site 5:  Pos        Results for orders placed or performed in visit on 07/13/16  Comprehensive Metabolic Panel (CMET)  Result Value Ref Range   Sodium 141 135 - 146 mmol/L   Potassium 3.8 3.5 - 5.3 mmol/L   Chloride 98 98 - 110 mmol/L   CO2 25 20 - 31 mmol/L   Glucose, Bld 156 (H) 65 - 99 mg/dL   BUN 16 7 - 25 mg/dL   Creat 1.09 0.60 - 1.35 mg/dL   Total Bilirubin 1.4 (H) 0.2 - 1.2 mg/dL   Alkaline Phosphatase 61 40 - 115 U/L   AST 29 10 - 40 U/L   ALT 23 9 - 46 U/L   Total Protein 7.0 6.1 - 8.1 g/dL   Albumin 4.5 3.6 - 5.1 g/dL   Calcium 9.7 8.6 - 10.3 mg/dL  Hemoglobin A1c  Result Value Ref Range   Hgb A1c MFr Bld 8.7 (H) <5.7 %   Mean Plasma Glucose 203 mg/dL  Microalbumin / creatinine urine ratio  Result Value Ref Range   Creatinine, Urine CANCELED 20 - 370 mg/dL   Microalb, Ur CANCELED Not estab mg/dL   Microalb Creat Ratio CANCELED <30 mcg/mg creat  TSH  Result Value Ref Range   TSH 1.09 0.40 - 4.50 mIU/L  Lipid panel  Result Value Ref Range   Cholesterol 130 125 - 200 mg/dL   Triglycerides 122 <150 mg/dL   HDL 48 >=40 mg/dL   Total CHOL/HDL Ratio 2.7 <=5.0 Ratio   VLDL 24 <30 mg/dL   LDL Cholesterol 58 <130 mg/dL      Assessment & Plan:   Problem List Items Addressed This Visit      Cardiovascular and Mediastinum   Uncontrolled type 2 diabetes mellitus with peripheral circulatory disorder (HCC) (Chronic)    Managed by Dr. Gabriel Carina; foot exam done by MD today; seeing eye doctor monthly      Relevant Medications   glipiZIDE-metformin (METAGLIP) 2.5-500 MG tablet     Other   Orthopnea    Check BNP, CXR      Relevant Orders   DG  Chest 2 View   Medication monitoring encounter    Check liver, kidneys      Relevant Orders   CBC with Differential/Platelet   Low testosterone in male  I discussed with the patient the side effects of supplemental testosterone therapy including: enlargement of the prostate gland that may in turn cause LUTS, possible increased risk of prostate cancer, DVTs and/or PEs, possible increased risk of heart attack or stroke, lower sperm count, swelling of the ankles, feet, or body, with or without heart failure, enlarged or painful breasts, sleep apnea, increased prostate specific antigen, mood swings, hypertension and increased red blood cell count.       Hyperlipidemia LDL goal <100 (Chronic)    Check lipids today      Abnormal weight gain - Primary    Check thyroid function; check CXR and BNP, though he does not appear to be fluid-overloaded; CHF and hypothyroidism in the differential; now is not the time to initiate testosterone, which I explained to him; will work this up first; his obesity may in fact be the cause of his low testosterone; reasons to seek medical care over weekend reviewed      Relevant Orders   DG Chest 2 View    Other Visit Diagnoses    Needs flu shot       Relevant Orders   Flu Vaccine QUAD 36+ mos PF IM (Fluarix & Fluzone Quad PF) (Completed)      Follow up plan: Return in about 3 weeks (around 11/12/2016) for follow-up.  An after-visit summary was printed and given to the patient at [place].  Please see the patient instructions which may contain other information and recommendations beyond what is mentioned above in the assessment and plan.  Meds ordered this encounter  Medications  . glipiZIDE-metformin (METAGLIP) 2.5-500 MG tablet    Sig: Take 1 tablet by mouth 2 (two) times daily before a meal.    Orders Placed This Encounter  Procedures  . DG Chest 2 View  . Flu Vaccine QUAD 36+ mos PF IM (Fluarix & Fluzone Quad PF)  . CBC with  Differential/Platelet   [phone] Okay to leave message

## 2016-10-22 NOTE — Patient Instructions (Addendum)
We'll see what the xray and the labs show Check out the information at familydoctor.org entitled "Nutrition for Weight Loss: What You Need to Know about Fad Diets" Try to lose between 1-2 pounds per week by taking in fewer calories and burning off more calories You can succeed by limiting portions, limiting foods dense in calories and fat, becoming more active, and drinking 8 glasses of water a day (64 ounces) Don't skip meals, especially breakfast, as skipping meals may alter your metabolism Do not use over-the-counter weight loss pills or gimmicks that claim rapid weight loss A healthy BMI (or body mass index) is between 18.5 and 24.9 You can calculate your ideal BMI at the [place] website ClubMonetize.fr Your goal blood pressure is less than 130 mmHg on top. Try to follow the DASH guidelines (DASH stands for Dietary Approaches to Stop Hypertension) Try to limit the sodium in your diet.  Ideally, consume less than 1.5 grams (less than 1,500mg ) per day. Do not add salt when cooking or at the table.  Check the sodium amount on labels when shopping, and choose items lower in sodium when given a choice. Avoid or limit foods that already contain a lot of sodium. Eat a diet rich in fruits and vegetables and whole grains.  DASH Eating Plan DASH stands for "Dietary Approaches to Stop Hypertension." The DASH eating plan is a healthy eating plan that has been shown to reduce high blood pressure (hypertension). Additional health benefits may include reducing the risk of type 2 diabetes mellitus, heart disease, and stroke. The DASH eating plan may also help with weight loss. WHAT DO I NEED TO KNOW ABOUT THE DASH EATING PLAN? For the DASH eating plan, you will follow these general guidelines:  Choose foods with a percent daily value for sodium of less than 5% (as listed on the food label).  Use salt-free seasonings or herbs instead of table salt or sea  salt.  Check with your health care provider or pharmacist before using salt substitutes.  Eat lower-sodium products, often labeled as "lower sodium" or "no salt added."  Eat fresh foods.  Eat more vegetables, fruits, and low-fat dairy products.  Choose whole grains. Look for the word "whole" as the first word in the ingredient list.  Choose fish and skinless chicken or [place] more often than red meat. Limit fish, poultry, and meat to 6 oz (170 g) each day.  Limit sweets, desserts, sugars, and sugary drinks.  Choose heart-healthy fats.  Limit cheese to 1 oz (28 g) per day.  Eat more home-cooked food and less restaurant, buffet, and fast food.  Limit fried foods.  Cook foods using methods other than frying.  Limit canned vegetables. If you do use them, rinse them well to decrease the sodium.  When eating at a restaurant, ask that your food be prepared with less salt, or no salt if possible. WHAT FOODS CAN I EAT? Seek help from a dietitian for individual calorie needs. Grains Whole grain or whole wheat bread. Brown rice. Whole grain or whole wheat pasta. Quinoa, bulgur, and whole grain cereals. Low-sodium cereals. Corn or whole wheat flour tortillas. Whole grain cornbread. Whole grain crackers. Low-sodium crackers. Vegetables Fresh or frozen vegetables (raw, steamed, roasted, or grilled). Low-sodium or reduced-sodium tomato and vegetable juices. Low-sodium or reduced-sodium tomato sauce and paste. Low-sodium or reduced-sodium canned vegetables.  Fruits All fresh, canned (in natural juice), or frozen fruits. Meat and Other Protein Products Ground beef (85% or leaner), grass-fed beef, or beef trimmed  of fat. Skinless chicken or [place]. Ground chicken or [place]. Pork trimmed of fat. All fish and seafood. Eggs. Dried beans, peas, or lentils. Unsalted nuts and seeds. Unsalted canned beans. Dairy Low-fat dairy products, such as skim or 1% milk, 2% or reduced-fat cheeses, low-fat  ricotta or cottage cheese, or plain low-fat yogurt. Low-sodium or reduced-sodium cheeses. Fats and Oils Tub margarines without trans fats. Light or reduced-fat mayonnaise and salad dressings (reduced sodium). Avocado. Safflower, olive, or canola oils. Natural peanut or almond butter. Other Unsalted popcorn and pretzels. The items listed above may not be a complete list of recommended foods or beverages. Contact your dietitian for more options. WHAT FOODS ARE NOT RECOMMENDED? Grains White bread. White pasta. White rice. Refined cornbread. Bagels and croissants. Crackers that contain trans fat. Vegetables Creamed or fried vegetables. Vegetables in a cheese sauce. Regular canned vegetables. Regular canned tomato sauce and paste. Regular tomato and vegetable juices. Fruits Dried fruits. Canned fruit in light or heavy syrup. Fruit juice. Meat and Other Protein Products Fatty cuts of meat. Ribs, chicken wings, bacon, sausage, bologna, salami, chitterlings, fatback, hot dogs, bratwurst, and packaged luncheon meats. Salted nuts and seeds. Canned beans with salt. Dairy Whole or 2% milk, cream, half-and-half, and cream cheese. Whole-fat or sweetened yogurt. Full-fat cheeses or blue cheese. Nondairy creamers and whipped toppings. Processed cheese, cheese spreads, or cheese curds. Condiments Onion and garlic salt, seasoned salt, table salt, and sea salt. Canned and packaged gravies. Worcestershire sauce. Tartar sauce. Barbecue sauce. Teriyaki sauce. Soy sauce, including reduced sodium. Steak sauce. Fish sauce. Oyster sauce. Cocktail sauce. Horseradish. Ketchup and mustard. Meat flavorings and tenderizers. Bouillon cubes. Hot sauce. Tabasco sauce. Marinades. Taco seasonings. Relishes. Fats and Oils Butter, stick margarine, lard, shortening, ghee, and bacon fat. Coconut, palm kernel, or palm oils. Regular salad dressings. Other Pickles and olives. Salted popcorn and pretzels. The items listed above may not  be a complete list of foods and beverages to avoid. Contact your dietitian for more information. WHERE CAN I FIND MORE INFORMATION? National Heart, Lung, and Blood Institute: travelstabloid.com   This information is not intended to replace advice given to you by your health care provider. Make sure you discuss any questions you have with your health care provider.   Document Released: 12/02/2011 Document Revised: 01/03/2015 Document Reviewed: 10/17/2013 Elsevier Interactive Patient Education [employer].

## 2016-10-22 NOTE — Assessment & Plan Note (Signed)
Check lipids today 

## 2016-10-22 NOTE — Assessment & Plan Note (Signed)
Check liver, kidneys 

## 2016-10-22 NOTE — Assessment & Plan Note (Addendum)
Check thyroid function; check CXR and BNP, though he does not appear to be fluid-overloaded; CHF and hypothyroidism in the differential; now is not the time to initiate testosterone, which I explained to him; will work this up first; his obesity may in fact be the cause of his low testosterone; reasons to seek medical care over weekend reviewed

## 2016-10-22 NOTE — Assessment & Plan Note (Signed)
I discussed with the patient the side effects of supplemental testosterone therapy including: enlargement of the prostate gland that may in turn cause LUTS, possible increased risk of prostate cancer, DVTs and/or PEs, possible increased risk of heart attack or stroke, lower sperm count, swelling of the ankles, feet, or body, with or without heart failure, enlarged or painful breasts, sleep apnea, increased prostate specific antigen, mood swings, hypertension and increased red blood cell count.

## 2016-10-22 NOTE — Assessment & Plan Note (Addendum)
Managed by Dr. Gabriel Carina; foot exam done by MD today; seeing eye doctor monthly

## 2016-10-22 NOTE — Assessment & Plan Note (Addendum)
Check BNP, CXR

## 2016-11-01 ENCOUNTER — Encounter (INDEPENDENT_AMBULATORY_CARE_PROVIDER_SITE_OTHER): Payer: BLUE CROSS/BLUE SHIELD | Admitting: Ophthalmology

## 2016-11-01 DIAGNOSIS — E113313 Type 2 diabetes mellitus with moderate nonproliferative diabetic retinopathy with macular edema, bilateral: Secondary | ICD-10-CM | POA: Diagnosis not present

## 2016-11-01 DIAGNOSIS — H43813 Vitreous degeneration, bilateral: Secondary | ICD-10-CM | POA: Diagnosis not present

## 2016-11-01 DIAGNOSIS — E11311 Type 2 diabetes mellitus with unspecified diabetic retinopathy with macular edema: Secondary | ICD-10-CM | POA: Diagnosis not present

## 2016-11-01 DIAGNOSIS — H35033 Hypertensive retinopathy, bilateral: Secondary | ICD-10-CM | POA: Diagnosis not present

## 2016-11-01 DIAGNOSIS — I10 Essential (primary) hypertension: Secondary | ICD-10-CM | POA: Diagnosis not present

## 2016-11-12 ENCOUNTER — Ambulatory Visit (INDEPENDENT_AMBULATORY_CARE_PROVIDER_SITE_OTHER): Payer: BLUE CROSS/BLUE SHIELD | Admitting: Family Medicine

## 2016-11-12 ENCOUNTER — Encounter: Payer: Self-pay | Admitting: Family Medicine

## 2016-11-12 DIAGNOSIS — E1151 Type 2 diabetes mellitus with diabetic peripheral angiopathy without gangrene: Secondary | ICD-10-CM

## 2016-11-12 DIAGNOSIS — E1165 Type 2 diabetes mellitus with hyperglycemia: Secondary | ICD-10-CM | POA: Diagnosis not present

## 2016-11-12 DIAGNOSIS — IMO0002 Reserved for concepts with insufficient information to code with codable children: Secondary | ICD-10-CM

## 2016-11-12 DIAGNOSIS — I10 Essential (primary) hypertension: Secondary | ICD-10-CM

## 2016-11-12 NOTE — Assessment & Plan Note (Signed)
Managed by Dr. Gabriel Carina; foot exam today by MD; refer to diabetic educator

## 2016-11-12 NOTE — Progress Notes (Signed)
BP 118/78 (BP Location: Left Arm, Patient Position: Sitting, Cuff Size: Normal)   Pulse 91   Temp 98.9 F (37.2 C) (Oral)   Resp 14   Ht 5\' 10"  (1.778 m)   Wt 293 lb (132.9 kg)   SpO2 97%   BMI 42.04 kg/m    Subjective:    Patient ID: Clarence Black, male    DOB: 1965/12/30, 50 y.o.   MRN: [id number]  HPI: Clarence Black is a 50 y.o. male  Chief Complaint  Patient presents with  . Follow-up    3 weeks    Patient is here for follow-up At his last visit in October, he had gained significant weight We got a BNP which was normal, a CMP which showed normal albumin, normal TSH and free T4, and a CBC which did not show anemia; I had also ordered a CXR; however, he never got the CXR done Since last visit, he is having a little joint pain in the knees with bending  Type 2 diabetes; seeing Dr. Gabriel Carina; sees her on Monday, A1c has come down from 9.3 to 8.6 Has not changed anything in the last few weeks; he will check on getting diabetic education No alcohol; can   Depression screen [facility] 2/9 11/12/2016 10/22/2016 07/13/2016 12/09/2015 09/12/2015  Decreased Interest 0 0 0 0 0  Down, Depressed, Hopeless 0 0 - 0 0  PHQ - 2 Score 0 0 0 0 0   Relevant past medical, surgical, family and social history reviewed Past Medical History:  Diagnosis Date  . Diabetes mellitus without complication ([place])   . Erectile dysfunction   . Erectile dysfunction associated with type 2 diabetes mellitus ([place]) 02/20/2015  . Hyperlipidemia LDL goal <100 06/13/2015  . Hypertension   . Hypertension goal BP (blood pressure) < 130/80 06/13/2015  . Hypogonadism, male   . Obesity, Class III, BMI 40-49.9 (morbid obesity) ([place]) 02/20/2015  . Type 2 diabetes mellitus ([place]) 10/01/2015  . Uncontrolled type 2 diabetes mellitus with peripheral circulatory disorder ([place]) 06/13/2015   Past Surgical History:  Procedure Laterality Date  . COLONOSCOPY  2013   Family History  Problem Relation Age of Onset  . Diabetes Mother    . Kidney disease Mother    Social History  Substance Use Topics  . Smoking status: Never Smoker  . Smokeless tobacco: Never Used  . Alcohol use No   Interim medical history since last visit reviewed. Allergies and medications reviewed  Review of Systems Per HPI unless specifically indicated above     Objective:    BP 118/78 (BP Location: Left Arm, Patient Position: Sitting, Cuff Size: Normal)   Pulse 91   Temp 98.9 F (37.2 C) (Oral)   Resp 14   Ht 5\' 10"  (1.778 m)   Wt 293 lb (132.9 kg)   SpO2 97%   BMI 42.04 kg/m   Wt Readings from Last 3 Encounters:  11/12/16 293 lb (132.9 kg)  10/22/16 294 lb (133.4 kg)  09/16/16 281 lb (127.5 kg)    Physical Exam  Constitutional: He appears well-developed and well-nourished. No distress.  Morbidly obese; weight down one pound over last 3 weeks  HENT:  Head: Atraumatic.  Eyes: EOM are normal. No scleral icterus.  Neck: No JVD present. No thyromegaly present.  Cardiovascular: Normal rate and regular rhythm.   Pulmonary/Chest: Effort normal and breath sounds normal.  Abdominal: Soft. Bowel sounds are normal. He exhibits no distension.  Musculoskeletal: He exhibits no edema.  Lymphadenopathy:  He has no cervical adenopathy.  Neurological: He is alert. Coordination normal.  Skin: Skin is warm and dry. No pallor.  Psychiatric: He has a normal mood and affect. His behavior is normal. Judgment and thought content normal.   Diabetic Foot Form - Detailed   Diabetic Foot Exam - detailed Diabetic Foot exam was performed with the following findings:  Yes 11/12/2016 12:05 PM  Visual Foot Exam completed.:  Yes  Are the toenails ingrown?:  No Normal Range of Motion:  Yes Pulse Foot Exam completed.:  Yes  Right Dorsalis Pedis:  Present Left Dorsalis Pedis:  Present  Sensory Foot Exam Completed.:  Yes Semmes-Weinstein Monofilament Test R Site 1-Great Toe:  Pos L Site 1-Great Toe:  Pos  R Site 4:  Pos L Site 4:  Pos  R Site 5:  Pos  L Site 5:  Pos        Results for orders placed or performed during the hospital encounter of 10/22/16  CBC with Differential/Platelet  Result Value Ref Range   WBC 7.2 3.8 - 10.6 K/uL   RBC 5.26 4.40 - 5.90 MIL/uL   Hemoglobin 15.1 13.0 - 18.0 g/dL   HCT 44.3 40.0 - 52.0 %   MCV 84.2 80.0 - 100.0 fL   MCH 28.7 26.0 - 34.0 pg   MCHC 34.0 32.0 - 36.0 g/dL   RDW 14.9 (H) 11.5 - 14.5 %   Platelets 156 150 - 440 K/uL   Neutrophils Relative % 58 %   Neutro Abs 4.2 1.4 - 6.5 K/uL   Lymphocytes Relative 33 %   Lymphs Abs 2.3 1.0 - 3.6 K/uL   Monocytes Relative 8 %   Monocytes Absolute 0.6 0.2 - 1.0 K/uL   Eosinophils Relative 1 %   Eosinophils Absolute 0.1 0 - 0.7 K/uL   Basophils Relative 0 %   Basophils Absolute 0.0 0 - 0.1 K/uL  Lipid panel  Result Value Ref Range   Cholesterol 130 0 - 200 mg/dL   Triglycerides 132 <150 mg/dL   HDL 42 >40 mg/dL   Total CHOL/HDL Ratio 3.1 RATIO   VLDL 26 0 - 40 mg/dL   LDL Cholesterol 62 0 - 99 mg/dL  Brain natriuretic peptide  Result Value Ref Range   B Natriuretic Peptide 4.0 0.0 - 100.0 pg/mL  T4, free  Result Value Ref Range   Free T4 0.77 0.61 - 1.12 ng/dL  Comprehensive metabolic panel  Result Value Ref Range   Sodium 137 135 - 145 mmol/L   Potassium 3.4 (L) 3.5 - 5.1 mmol/L   Chloride 103 101 - 111 mmol/L   CO2 27 22 - 32 mmol/L   Glucose, Bld 102 (H) 65 - 99 mg/dL   BUN 12 6 - 20 mg/dL   Creatinine, Ser 0.87 0.61 - 1.24 mg/dL   Calcium 9.1 8.9 - 10.3 mg/dL   Total Protein 7.1 6.5 - 8.1 g/dL   Albumin 4.3 3.5 - 5.0 g/dL   AST 30 15 - 41 U/L   ALT 30 17 - 63 U/L   Alkaline Phosphatase 66 38 - 126 U/L   Total Bilirubin 0.9 0.3 - 1.2 mg/dL   GFR calc non Af Amer >60 >60 mL/min   GFR calc Af Amer >60 >60 mL/min   Anion gap 7 5 - 15  TSH  Result Value Ref Range   TSH 2.118 0.350 - 4.500 uIU/mL      Assessment & Plan:   Problem List Items Addressed This Visit  Cardiovascular and Mediastinum   Uncontrolled type 2  diabetes mellitus with peripheral circulatory disorder (HCC) (Chronic)    Managed by Dr. Gabriel Carina; foot exam today by MD; refer to diabetic educator      Relevant Orders   Ambulatory referral to diabetic education   Hypertension goal BP (blood pressure) < 130/80 (Chronic)    Controlled today; weight loss should help        Other   Obesity, Class III, BMI 40-49.9 (morbid obesity) ([place]) (Chronic)    Encouraged patient to work on weight loss; so important for his diabetes, blood pressure, cholesterol, knees, etc; discussed using calorie counter, something to track activity, even if for a short time to get a baseline as to how much he's eating and how much he's moving          Follow up plan: No Follow-up on file.  An after-visit summary was printed and given to the patient at [place].  Please see the patient instructions which may contain other information and recommendations beyond what is mentioned above in the assessment and plan.  No orders of the defined types were placed in this encounter.   Orders Placed This Encounter  Procedures  . Ambulatory referral to diabetic education

## 2016-11-12 NOTE — Patient Instructions (Addendum)
Take sublingual vitamin B12 500 or 1000 mcg for energy Take about an hour before feeling tired Use Myfitnesspal or some other free app to count calories We can help you get in to the nutritionist See Dr. Gabriel Carina on Monday Check out the information at familydoctor.org entitled "Nutrition for Weight Loss: What You Need to Know about Fad Diets" Try to lose between 1-2 pounds per week by taking in fewer calories and burning off more calories You can succeed by limiting portions, limiting foods dense in calories and fat, becoming more active, and drinking 8 glasses of water a day (64 ounces) Don't skip meals, especially breakfast, as skipping meals may alter your metabolism Do not use over-the-counter weight loss pills or gimmicks that claim rapid weight loss A healthy BMI (or body mass index) is between 18.5 and 24.9 You can calculate your ideal BMI at the [place] website ClubMonet[employer]fr With exercise, start low and build up slowly See Dr. Gabriel Carina on Monday  Diabetes Mellitus and Food It is important for you to manage your blood sugar (glucose) level. Your blood glucose level can be greatly affected by what you eat. Eating healthier foods in the appropriate amounts throughout the day at about the same time each day will help you control your blood glucose level. It can also help slow or prevent worsening of your diabetes mellitus. Healthy eating may even help you improve the level of your blood pressure and reach or maintain a healthy weight. General recommendations for healthful eating and cooking habits include:  Eating meals and snacks regularly. Avoid going long periods of time without eating to lose weight.  Eating a diet that consists mainly of plant-based foods, such as fruits, vegetables, nuts, legumes, and whole grains.  Using low-heat cooking methods, such as baking, instead of high-heat cooking methods, such as deep frying. Work with your  dietitian to make sure you understand how to use the Nutrition Facts information on food labels. How can food affect me? Carbohydrates  Carbohydrates affect your blood glucose level more than any other type of food. Your dietitian will help you determine how many carbohydrates to eat at each meal and teach you how to count carbohydrates. Counting carbohydrates is important to keep your blood glucose at a healthy level, especially if you are using insulin or taking certain medicines for diabetes mellitus. Alcohol  Alcohol can cause sudden decreases in blood glucose (hypoglycemia), especially if you use insulin or take certain medicines for diabetes mellitus. Hypoglycemia can be a life-threatening condition. Symptoms of hypoglycemia (sleepiness, dizziness, and disorientation) are similar to symptoms of having too much alcohol. If your health care provider has given you approval to drink alcohol, do so in moderation and use the following guidelines:  Women should not have more than one drink per day, and men should not have more than two drinks per day. One drink is equal to:  12 oz of beer.  5 oz of wine.  1 oz of hard liquor.  Do not drink on an empty stomach.  Keep yourself hydrated. Have water, diet soda, or unsweetened iced tea.  Regular soda, juice, and other mixers might contain a lot of carbohydrates and should be counted. What foods are not recommended? As you make food choices, it is important to remember that all foods are not the same. Some foods have fewer nutrients per serving than other foods, even though they might have the same number of calories or carbohydrates. It is difficult to get your body what it  needs when you eat foods with fewer nutrients. Examples of foods that you should avoid that are high in calories and carbohydrates but low in nutrients include:  Trans fats (most processed foods list trans fats on the Nutrition Facts label).  Regular  soda.  Juice.  Candy.  Sweets, such as cake, pie, doughnuts, and cookies.  Fried foods. What foods can I eat? Eat nutrient-rich foods, which will nourish your body and keep you healthy. The food you should eat also will depend on several factors, including:  The calories you need.  The medicines you take.  Your weight.  Your blood glucose level.  Your blood pressure level.  Your cholesterol level. You should eat a variety of foods, including:  Protein.  Lean cuts of meat.  Proteins low in saturated fats, such as fish, egg whites, and beans. Avoid processed meats.  Fruits and vegetables.  Fruits and vegetables that may help control blood glucose levels, such as apples, mangoes, and yams.  Dairy products.  Choose fat-free or low-fat dairy products, such as milk, yogurt, and cheese.  Grains, bread, pasta, and rice.  Choose whole grain products, such as multigrain bread, whole oats, and brown rice. These foods may help control blood pressure.  Fats.  Foods containing healthful fats, such as nuts, avocado, olive oil, canola oil, and fish. Does everyone with diabetes mellitus have the same meal plan? Because every person with diabetes mellitus is different, there is not one meal plan that works for everyone. It is very important that you meet with a dietitian who will help you create a meal plan that is just right for you. This information is not intended to replace advice given to you by your health care provider. Make sure you discuss any questions you have with your health care provider. Document Released: 09/09/2005 Document Revised: 05/20/2016 Document Reviewed: 11/09/2013 Elsevier Interactive Patient Education  2017 [employer].

## 2016-11-21 NOTE — Assessment & Plan Note (Signed)
Encouraged patient to work on weight loss; so important for his diabetes, blood pressure, cholesterol, knees, etc; discussed using calorie counter, something to track activity, even if for a short time to get a baseline as to how much he's eating and how much he's moving

## 2016-11-21 NOTE — Assessment & Plan Note (Signed)
Controlled today; weight loss should help

## 2016-11-29 ENCOUNTER — Encounter (INDEPENDENT_AMBULATORY_CARE_PROVIDER_SITE_OTHER): Payer: BLUE CROSS/BLUE SHIELD | Admitting: Ophthalmology

## 2016-12-09 ENCOUNTER — Encounter (INDEPENDENT_AMBULATORY_CARE_PROVIDER_SITE_OTHER): Payer: BLUE CROSS/BLUE SHIELD | Admitting: Ophthalmology

## 2016-12-09 DIAGNOSIS — H43813 Vitreous degeneration, bilateral: Secondary | ICD-10-CM

## 2016-12-09 DIAGNOSIS — E11311 Type 2 diabetes mellitus with unspecified diabetic retinopathy with macular edema: Secondary | ICD-10-CM

## 2016-12-09 DIAGNOSIS — E113313 Type 2 diabetes mellitus with moderate nonproliferative diabetic retinopathy with macular edema, bilateral: Secondary | ICD-10-CM | POA: Diagnosis not present

## 2016-12-09 DIAGNOSIS — I10 Essential (primary) hypertension: Secondary | ICD-10-CM

## 2016-12-09 DIAGNOSIS — H35033 Hypertensive retinopathy, bilateral: Secondary | ICD-10-CM | POA: Diagnosis not present

## 2016-12-15 ENCOUNTER — Other Ambulatory Visit: Payer: Self-pay | Admitting: Family Medicine

## 2016-12-15 ENCOUNTER — Telehealth: Payer: Self-pay | Admitting: Family Medicine

## 2016-12-15 ENCOUNTER — Other Ambulatory Visit: Payer: Self-pay

## 2016-12-15 DIAGNOSIS — E1151 Type 2 diabetes mellitus with diabetic peripheral angiopathy without gangrene: Secondary | ICD-10-CM

## 2016-12-15 DIAGNOSIS — E1165 Type 2 diabetes mellitus with hyperglycemia: Principal | ICD-10-CM

## 2016-12-15 DIAGNOSIS — IMO0002 Reserved for concepts with insufficient information to code with codable children: Secondary | ICD-10-CM

## 2016-12-15 MED ORDER — METFORMIN HCL ER 500 MG PO TB24: 500.0000 mg | ORAL_TABLET | Freq: Two times a day (BID) | ORAL | Status: DC

## 2016-12-15 NOTE — Progress Notes (Signed)
Metformin updated, prescribed by endo

## 2016-12-15 NOTE — Telephone Encounter (Signed)
Please contact patient We got a note from insurance It says they won't pay for his Jardiance, but our records show that he's not taking this any more anyway, right? Just clarify please, and find out how his sugars are doing Thank you

## 2016-12-15 NOTE — Telephone Encounter (Signed)
Patient notified is still taking jardiance was told to call endo, med list updated

## 2016-12-31 ENCOUNTER — Ambulatory Visit (INDEPENDENT_AMBULATORY_CARE_PROVIDER_SITE_OTHER): Payer: BLUE CROSS/BLUE SHIELD | Admitting: Family Medicine

## 2016-12-31 ENCOUNTER — Other Ambulatory Visit: Payer: Self-pay | Admitting: Family Medicine

## 2016-12-31 ENCOUNTER — Encounter: Payer: Self-pay | Admitting: Family Medicine

## 2016-12-31 DIAGNOSIS — S83411A Sprain of medial collateral ligament of right knee, initial encounter: Secondary | ICD-10-CM | POA: Diagnosis not present

## 2016-12-31 DIAGNOSIS — F40243 Fear of flying: Secondary | ICD-10-CM

## 2016-12-31 DIAGNOSIS — E1165 Type 2 diabetes mellitus with hyperglycemia: Secondary | ICD-10-CM | POA: Diagnosis not present

## 2016-12-31 DIAGNOSIS — S83419A Sprain of medial collateral ligament of unspecified knee, initial encounter: Secondary | ICD-10-CM | POA: Insufficient documentation

## 2016-12-31 DIAGNOSIS — K429 Umbilical hernia without obstruction or gangrene: Secondary | ICD-10-CM

## 2016-12-31 DIAGNOSIS — IMO0002 Reserved for concepts with insufficient information to code with codable children: Secondary | ICD-10-CM

## 2016-12-31 DIAGNOSIS — M6208 Separation of muscle (nontraumatic), other site: Secondary | ICD-10-CM

## 2016-12-31 DIAGNOSIS — K439 Ventral hernia without obstruction or gangrene: Secondary | ICD-10-CM | POA: Diagnosis not present

## 2016-12-31 DIAGNOSIS — E1151 Type 2 diabetes mellitus with diabetic peripheral angiopathy without gangrene: Secondary | ICD-10-CM | POA: Diagnosis not present

## 2016-12-31 HISTORY — DX: Separation of muscle (nontraumatic), other site: M62.08

## 2016-12-31 HISTORY — DX: Fear of flying: F40.243

## 2016-12-31 MED ORDER — KNEE BRACE/HINGED BARS LARGE MISC: 0 refills | Status: DC

## 2016-12-31 MED ORDER — LORAZEPAM 0.5 MG PO TABS: 0.5000 mg | ORAL_TABLET | Freq: Four times a day (QID) | ORAL | 0 refills | Status: DC | PRN

## 2016-12-31 MED ORDER — MELOXICAM 15 MG PO TABS: 15.0000 mg | ORAL_TABLET | Freq: Every day | ORAL | 0 refills | Status: DC | PRN

## 2016-12-31 NOTE — Assessment & Plan Note (Signed)
Willing to give patient limited Rx for benzo; advised he should not drive after taking this and he promises same

## 2016-12-31 NOTE — Patient Instructions (Addendum)
We'll refer you to a general surgeon about your hernia Keep up the work with weight loss If you develop signs or symptoms of incarcerated hernia, go immediately to the ER Use the meloxicam for knee pain and inflammation Try the hinged brace I can refer you to physical therapy if desired (I think it would help); I can also refer you to an orthopaedist if not improving, in case there is a tear or other ligamental damage; just let me know Umbilical Hernia, Adult A hernia is a bulge of tissue that pushes through an opening between muscles. An umbilical hernia happens in the abdomen, near the belly button (umbilicus). The hernia may contain tissues from the small intestine, large intestine, or fatty tissue covering the intestines (omentum). Umbilical hernias in adults tend to get worse over time, and they require surgical treatment. There are several types of umbilical hernias. You may have:  A hernia located just above or below the umbilicus (indirect hernia). This is the most common type of umbilical hernia in adults.  A hernia that forms through an opening formed by the umbilicus (direct hernia).  A hernia that comes and goes (reducible hernia). A reducible hernia may be visible only when you strain, lift something heavy, or cough. This type of hernia can be pushed back into the abdomen (reduced).  A hernia that traps abdominal tissue inside the hernia (incarcerated hernia). This type of hernia cannot be reduced.  A hernia that cuts off blood flow to the tissues inside the hernia (strangulated hernia). The tissues can start to die if this happens. This type of hernia requires emergency treatment. What are the causes? An umbilical hernia happens when tissue inside the abdomen presses on a weak area of the abdominal muscles. What increases the risk? You may have a greater risk of this condition if you:  Are obese.  Have had several pregnancies.  Have a buildup of fluid inside your abdomen  (ascites).  Have had surgery that weakens the abdominal muscles. What are the signs or symptoms? The main symptom of this condition is a painless bulge at or near the belly button. A reducible hernia may be visible only when you strain, lift something heavy, or cough. Other symptoms may include:  Dull pain.  A feeling of pressure. Symptoms of a strangulated hernia may include:  Pain that gets increasingly worse.  Nausea and vomiting.  Pain when pressing on the hernia.  Skin over the hernia becoming red or purple.  Constipation.  Blood in the stool. How is this diagnosed? This condition may be diagnosed based on:  A physical exam. You may be asked to cough or strain while standing. These actions increase the pressure inside your abdomen and force the hernia through the opening in your muscles. Your health care provider may try to reduce the hernia by pressing on it.  Your symptoms and medical history. How is this treated? Surgery is the only treatment for an umbilical hernia. Surgery for a strangulated hernia is done as soon as possible. If you have a small hernia that is not incarcerated, you may need to lose weight before having surgery. Follow these instructions at home:  Lose weight, if told by your health care provider.  Do not try to push the hernia back in.  Watch your hernia for any changes in color or size. Tell your health care provider if any changes occur.  You may need to avoid activities that increase pressure on your hernia.  Do not lift  anything that is heavier than 10 lb (4.5 kg) until your health care provider says that this is safe.  Take over-the-counter and prescription medicines only as told by your health care provider.  Keep all follow-up visits as told by your health care provider. This is important. Contact a health care provider if:  Your hernia gets larger.  Your hernia becomes painful. Get help right away if:  You develop sudden, severe  pain near the area of your hernia.  You have pain as well as nausea or vomiting.  You have pain and the skin over your hernia changes color.  You develop a fever. This information is not intended to replace advice given to you by your health care provider. Make sure you discuss any questions you have with your health care provider. Document Released: 05/14/2016 Document Revised: 08/15/2016 Document Reviewed: 05/14/2016 Elsevier Interactive Patient Education  2017 [employer].  Knee Sprain A knee sprain is a stretch or tear in a knee ligament. Knee ligaments are bands of tissue that connect bones in the knee to each other. Follow these instructions at home: If you have a splint or brace:  Wear the splint or brace as told by your doctor. Remove it only as told by your doctor.  Loosen the splint or brace if your toes tingle, get numb, or turn cold and blue.  Keep the splint or brace clean.  If the splint or brace is not waterproof:  Do not let it get wet.  Cover it with a watertight covering when you take a bath or a shower. If you have a cast:  Do not stick anything inside the cast to scratch your skin.  Check the skin around the cast every day. Tell your doctor about any concerns.  You may put lotion on dry skin around the edges of the cast. Do not put lotion on the skin underneath the cast.  Keep the cast clean.  If the cast is not waterproof:  Do not let it get wet.  Cover it with a watertight covering when you take a bath or a shower. Managing pain, stiffness, and swelling  Gently move your toes often to avoid stiffness and to lessen swelling.  Raise (elevate) the injured area above the level of your heart while you are sitting or lying down.  Take over-the-counter and prescription medicines only as told by your doctor.  If directed, put ice on the injured area.  If you have a removable splint or brace, remove it as told by your doctor.  Put ice in a plastic  bag.  Place a towel between your skin and the bag or between your cast and the bag.  Leave the ice on for 20 minutes, 2-3 times a day. General instructions  Do exercises as told by your doctor.  Keep all follow-up visits as told by your doctor. This is important. Contact a doctor if:  You have pain that gets worse.  The cast, brace, or splint does not fit right.  The cast, brace, or splint gets damaged. Get help right away if:  You cannot lean on your knee to stand or walk.  You cannot move the injured area.  You knee buckles or you have pain after you walk only a few steps.  You have very bad pain, swelling, or numbness below the cast, brace, or splint. Summary  A knee sprain is a stretch or tear in a band (ligament) that connects your knee bones to each other.  You may need to wear a splint, brace, or cast to help your knee get better.  Contact your doctor if you have very bad pain, swelling, or numbness, or if you cannot walk. This information is not intended to replace advice given to you by your health care provider. Make sure you discuss any questions you have with your health care provider. Document Released: 12/01/2009 Document Revised: 08/31/2016 Document Reviewed: 08/31/2016 Elsevier Interactive Patient Education  2017 [employer].

## 2016-12-31 NOTE — Assessment & Plan Note (Signed)
Will use NSAID, hinged brace, refer to ortho if not improving

## 2016-12-31 NOTE — Progress Notes (Signed)
BP 114/60 (BP Location: Left Arm, Patient Position: Sitting, Cuff Size: Normal)   Pulse 100   Temp 98.1 F (36.7 C) (Oral)   Resp 16   Wt 290 lb (131.5 kg)   SpO2 98%   BMI 41.61 kg/m    Subjective:    Patient ID: Clarence Black, male    DOB: Aug 21, 1966, 51 y.o.   MRN: [id number]  HPI: Clarence Black is a 51 y.o. male  Chief Complaint  Patient presents with  . Knee Pain    right knee  . GI Problem    patinet not sure if he has ulcer and or hernia ; had his physcial and this is what they saw   Patient is here for an acute visit He hyperextended his RIGHT knee stepping into a hole around middle of December He works for [employer], constantly moving, up and down Pain along the inside of the RIGHT knee  Went to [employer] physical and was found to have a hernia (umbilical and ventral)  Has to go on four flights coming up; asked for valium or something similar for trip; he has taken valium before; he is not a good flyer; promises to not drive  Depression screen [facility] 2/9 12/31/2016 11/12/2016 10/22/2016 07/13/2016 12/09/2015  Decreased Interest 0 0 0 0 0  Down, Depressed, Hopeless 0 0 0 - 0  PHQ - 2 Score 0 0 0 0 0   Relevant past medical, surgical, family and social history reviewed Past Medical History:  Diagnosis Date  . Diabetes mellitus without complication ([place])   . Diastasis recti 12/31/2016  . Erectile dysfunction   . Erectile dysfunction associated with type 2 diabetes mellitus ([place]) 02/20/2015  . 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 Hyperlipidemia LDL goal <100 06/13/2015  . Hypertension   . Hypertension goal BP (blood pressure) < 130/80 06/13/2015  . Hypogonadism, male   . Obesity, Class III, BMI 40-49.9 (morbid obesity) ([place]) 02/20/2015  . Type 2 diabetes mellitus ([place]) 10/01/2015  . Uncontrolled type 2 diabetes mellitus with peripheral circulatory disorder ([place]) 06/13/2015   Past Surgical History:  Procedure Laterality Date  . COLONOSCOPY  2013   Family History  Problem Relation Age  of Onset  . Diabetes Mother   . Kidney disease Mother    Social History  Substance Use Topics  . Smoking status: Never Smoker  . Smokeless tobacco: Never Used  . Alcohol use No   Interim medical history since last visit reviewed. Allergies and medications reviewed  Review of Systems Per HPI unless specifically indicated above     Objective:    BP 114/60 (BP Location: Left Arm, Patient Position: Sitting, Cuff Size: Normal)   Pulse 100   Temp 98.1 F (36.7 C) (Oral)   Resp 16   Wt 290 lb (131.5 kg)   SpO2 98%   BMI 41.61 kg/m   Wt Readings from Last 3 Encounters:  12/31/16 290 lb (131.5 kg)  11/12/16 293 lb (132.9 kg)  10/22/16 294 lb (133.4 kg)    Physical Exam  Constitutional: He appears well-developed and well-nourished. No distress.  Eyes: No scleral icterus.  Cardiovascular: Normal rate.   Pulmonary/Chest: Effort normal.  Abdominal: Soft. Bowel sounds are normal. There is no tenderness. A hernia is present. Hernia confirmed positive in the ventral area.  Umbilical and ventral hernias; soft, reducible  Musculoskeletal:       Right knee: He exhibits decreased range of motion. He exhibits no swelling, no effusion, no deformity, no erythema, no  LCL laxity and no MCL laxity (but there is some tenderness). Tenderness found. Medial joint line and MCL tenderness noted. No lateral joint line, no LCL and no patellar tendon tenderness noted.       Left knee: He exhibits no swelling, no effusion, no deformity and no erythema.       Legs: Neurological: He is alert.  Psychiatric: He has a normal mood and affect. His mood appears not anxious.   Diabetic Foot Form - Detailed   Diabetic Foot Exam - detailed Diabetic Foot exam was performed with the following findings:  Yes 12/31/2016 11:31 AM  Visual Foot Exam completed.:  Yes  Are the toenails ingrown?:  No Normal Range of Motion:  Yes Pulse Foot Exam completed.:  Yes  Right Dorsalis Pedis:  Present Left Dorsalis Pedis:   Present  Sensory Foot Exam Completed.:  Yes Semmes-Weinstein Monofilament Test R Site 1-Great Toe:  Pos L Site 1-Great Toe:  Pos  R Site 4:  Pos L Site 4:  Pos  R Site 5:  Pos L Site 5:  Pos       Results for orders placed or performed during the hospital encounter of 10/22/16  CBC with Differential/Platelet  Result Value Ref Range   WBC 7.2 3.8 - 10.6 K/uL   RBC 5.26 4.40 - 5.90 MIL/uL   Hemoglobin 15.1 13.0 - 18.0 g/dL   HCT 44.3 40.0 - 52.0 %   MCV 84.2 80.0 - 100.0 fL   MCH 28.7 26.0 - 34.0 pg   MCHC 34.0 32.0 - 36.0 g/dL   RDW 14.9 (H) 11.5 - 14.5 %   Platelets 156 150 - 440 K/uL   Neutrophils Relative % 58 %   Neutro Abs 4.2 1.4 - 6.5 K/uL   Lymphocytes Relative 33 %   Lymphs Abs 2.3 1.0 - 3.6 K/uL   Monocytes Relative 8 %   Monocytes Absolute 0.6 0.2 - 1.0 K/uL   Eosinophils Relative 1 %   Eosinophils Absolute 0.1 0 - 0.7 K/uL   Basophils Relative 0 %   Basophils Absolute 0.0 0 - 0.1 K/uL  Lipid panel  Result Value Ref Range   Cholesterol 130 0 - 200 mg/dL   Triglycerides 132 <150 mg/dL   HDL 42 >40 mg/dL   Total CHOL/HDL Ratio 3.1 RATIO   VLDL 26 0 - 40 mg/dL   LDL Cholesterol 62 0 - 99 mg/dL  Brain natriuretic peptide  Result Value Ref Range   B Natriuretic Peptide 4.0 0.0 - 100.0 pg/mL  T4, free  Result Value Ref Range   Free T4 0.77 0.61 - 1.12 ng/dL  Comprehensive metabolic panel  Result Value Ref Range   Sodium 137 135 - 145 mmol/L   Potassium 3.4 (L) 3.5 - 5.1 mmol/L   Chloride 103 101 - 111 mmol/L   CO2 27 22 - 32 mmol/L   Glucose, Bld 102 (H) 65 - 99 mg/dL   BUN 12 6 - 20 mg/dL   Creatinine, Ser 0.87 0.61 - 1.24 mg/dL   Calcium 9.1 8.9 - 10.3 mg/dL   Total Protein 7.1 6.5 - 8.1 g/dL   Albumin 4.3 3.5 - 5.0 g/dL   AST 30 15 - 41 U/L   ALT 30 17 - 63 U/L   Alkaline Phosphatase 66 38 - 126 U/L   Total Bilirubin 0.9 0.3 - 1.2 mg/dL   GFR calc non Af Amer >60 >60 mL/min   GFR calc Af Amer >60 >60 mL/min   Anion  gap 7 5 - 15  TSH  Result  Value Ref Range   TSH 2.118 0.350 - 4.500 uIU/mL      Assessment & Plan:   Problem List Items Addressed This Visit      Cardiovascular and Mediastinum   Uncontrolled type 2 diabetes mellitus with peripheral circulatory disorder (HCC) (Chronic)    Foot exam done today by MD      Relevant Medications   atorvastatin (LIPITOR) 40 MG tablet   JARDIANCE 25 MG TABS tablet     Musculoskeletal and Integument   Medial collateral ligament sprain of knee    Will use NSAID, hinged brace, refer to ortho if not improving        Other   Ventral hernia without obstruction or gangrene    Refer to surgeon; if s/s of incarceration, go to ER immediately; work on weight loss      Relevant Orders   Ambulatory referral to General Surgery   Umbilical hernia without obstruction and without gangrene    Discussed dx, will refer to general surgeon; if s/s of incarceration develop, go to ER immediately      Relevant Orders   Ambulatory referral to General Surgery   Fear of flying    Willing to give patient limited Rx for benzo; advised he should not drive after taking this and he promises same      Relevant Medications   LORazepam (ATIVAN) 0.5 MG tablet      Follow up plan: No Follow-up on file.  An after-visit summary was printed and given to the patient at [facility].  Please see the patient instructions which may contain other information and recommendations beyond what is mentioned above in the assessment and plan.  Meds ordered this encounter  Medications  . atorvastatin (LIPITOR) 40 MG tablet    Sig: Take by mouth.  Marland Kitchen JARDIANCE 25 MG TABS tablet  . Elastic Bandages & Supports (KNEE BRACE/HINGED BARS LARGE) MISC    Sig: For use on RIGHT knee; wear when up and around; do not wear to bed    Dispense:  1 each    Refill:  0  . meloxicam (MOBIC) 15 MG tablet    Sig: Take 1 tablet (15 mg total) by mouth daily as needed for pain. For knee pain    Dispense:  30 tablet    Refill:  0  .  LORazepam (ATIVAN) 0.5 MG tablet    Sig: Take 1 tablet (0.5 mg total) by mouth every 6 (six) hours as needed for anxiety. Do not drive for six hours after taking    Dispense:  10 tablet    Refill:  0  Jardiance has no instructions -- prescribed by endocrinologist, likely daily Atorvastatin appears to be 80 mg once a day -- outside provider now prescribing perhaps, my instructions and Rx were canceled and were marked as "error", but I don't know why; endo must have changed the instructions  Orders Placed This Encounter  Procedures  . Ambulatory referral to General Surgery

## 2016-12-31 NOTE — Assessment & Plan Note (Signed)
Refer to surgeon; if s/s of incarceration, go to ER immediately; work on weight loss

## 2016-12-31 NOTE — Assessment & Plan Note (Addendum)
Refer to general surgeon; encouraged weight loss

## 2016-12-31 NOTE — Assessment & Plan Note (Signed)
Foot exam done today by MD 

## 2016-12-31 NOTE — Assessment & Plan Note (Signed)
Discussed dx, will refer to general surgeon; if s/s of incarceration develop, go to ER immediately

## 2017-01-01 NOTE — Telephone Encounter (Signed)
meloxicam was given on paper I thought; request received; approved

## 2017-01-05 ENCOUNTER — Other Ambulatory Visit: Payer: Self-pay | Admitting: Family Medicine

## 2017-01-05 ENCOUNTER — Encounter (INDEPENDENT_AMBULATORY_CARE_PROVIDER_SITE_OTHER): Payer: BLUE CROSS/BLUE SHIELD | Admitting: Ophthalmology

## 2017-01-06 ENCOUNTER — Encounter (INDEPENDENT_AMBULATORY_CARE_PROVIDER_SITE_OTHER): Payer: BLUE CROSS/BLUE SHIELD | Admitting: Ophthalmology

## 2017-01-06 ENCOUNTER — Other Ambulatory Visit: Payer: Self-pay | Admitting: Family Medicine

## 2017-01-06 DIAGNOSIS — E113313 Type 2 diabetes mellitus with moderate nonproliferative diabetic retinopathy with macular edema, bilateral: Secondary | ICD-10-CM | POA: Diagnosis not present

## 2017-01-06 DIAGNOSIS — H2513 Age-related nuclear cataract, bilateral: Secondary | ICD-10-CM | POA: Diagnosis not present

## 2017-01-06 DIAGNOSIS — E11311 Type 2 diabetes mellitus with unspecified diabetic retinopathy with macular edema: Secondary | ICD-10-CM | POA: Diagnosis not present

## 2017-01-06 DIAGNOSIS — I10 Essential (primary) hypertension: Secondary | ICD-10-CM | POA: Diagnosis not present

## 2017-01-06 DIAGNOSIS — H43813 Vitreous degeneration, bilateral: Secondary | ICD-10-CM | POA: Diagnosis not present

## 2017-01-06 DIAGNOSIS — H35033 Hypertensive retinopathy, bilateral: Secondary | ICD-10-CM | POA: Diagnosis not present

## 2017-01-06 NOTE — Telephone Encounter (Signed)
Patient states still  Has some, will call back when needs a refill

## 2017-01-06 NOTE — Telephone Encounter (Signed)
Give 4 days supply if out of medication, Dr. Sanda Klein will be back next week

## 2017-01-07 ENCOUNTER — Encounter (INDEPENDENT_AMBULATORY_CARE_PROVIDER_SITE_OTHER): Payer: BLUE CROSS/BLUE SHIELD | Admitting: Ophthalmology

## 2017-01-13 ENCOUNTER — Ambulatory Visit: Payer: BLUE CROSS/BLUE SHIELD | Admitting: Surgery

## 2017-01-14 ENCOUNTER — Encounter: Payer: BLUE CROSS/BLUE SHIELD | Attending: Family Medicine | Admitting: *Deleted

## 2017-01-14 ENCOUNTER — Encounter: Payer: Self-pay | Admitting: *Deleted

## 2017-01-14 VITALS — BP 164/90 | Ht 70.0 in | Wt 294.1 lb

## 2017-01-14 DIAGNOSIS — E1165 Type 2 diabetes mellitus with hyperglycemia: Secondary | ICD-10-CM | POA: Insufficient documentation

## 2017-01-14 DIAGNOSIS — Z713 Dietary counseling and surveillance: Secondary | ICD-10-CM | POA: Insufficient documentation

## 2017-01-14 DIAGNOSIS — E1151 Type 2 diabetes mellitus with diabetic peripheral angiopathy without gangrene: Secondary | ICD-10-CM | POA: Diagnosis not present

## 2017-01-14 DIAGNOSIS — E119 Type 2 diabetes mellitus without complications: Secondary | ICD-10-CM

## 2017-01-14 NOTE — Patient Instructions (Addendum)
Check blood sugars 2 x day - fasting and  2 hrs after meal   Bring blood sugar records to the next appointment  Exercise: Continue walking  for 30  minutes   3 days a week and gradually increase to 30 minutes 5 x week  Avoid sugar sweetened drinks (energy drinks, coffee, juices)  Eat 3 meals day, 2-3  snacks a day Space meals 4-6 hours apart Allow 2-3 hours between eating meals and snacks Complete 3 Day Food Record and bring to next appt  Rotate injection sites  Return for appointment on:  Thursday February 03, 2017 at 1:15 pm with Freda Munro (nurse)

## 2017-01-14 NOTE — Progress Notes (Signed)
Diabetes Self-Management Education  Visit Type: First/Initial  Appt. Start Time: 1315 Appt. End Time: [id number]  01/14/2017  Clarence Black, identified by name and date of birth, is a 51 y.o. male with a diagnosis of Diabetes: Type 2.   ASSESSMENT  Blood pressure (!) 164/90, height 5\' 10"  (1.778 m), weight 294 lb 1.6 oz (133.4 kg). Body mass index is 42.2 kg/m.      Diabetes Self-Management Education - 01/14/17 1517      Visit Information   Visit Type First/Initial     Initial Visit   Diabetes Type Type 2   Are you currently following a meal plan? No   Are you taking your medications as prescribed? Yes   Date Diagnosed 8 years ago     Health Coping   How would you rate your overall health? Good     Psychosocial Assessment   Patient Belief/Attitude about Diabetes Other (comment)  "I don't feel any different"   Self-care barriers None   Self-management support Doctor's office;Internet communities   Patient Concerns Nutrition/Meal planning;Medication;Glycemic Control;Healthy Lifestyle;Weight Control   Special Needs None   Preferred Learning Style Auditory   Learning Readiness Contemplating   How often do you need to have someone help you when you read instructions, pamphlets, or other written materials from your doctor or pharmacy? 1 - Never     Pre-Education Assessment   Patient understands the diabetes disease and treatment process. Needs Instruction   Patient understands incorporating nutritional management into lifestyle. Needs Instruction   Patient undertands incorporating physical activity into lifestyle. Needs Review   Patient understands using medications safely. Needs Instruction   Patient understands monitoring blood glucose, interpreting and using results Needs Review   Patient understands prevention, detection, and treatment of acute complications. Needs Instruction   Patient understands prevention, detection, and treatment of chronic complications. Needs  Instruction   Patient understands how to develop strategies to address psychosocial issues. Needs Instruction   Patient understands how to develop strategies to promote health/change behavior. Needs Instruction     Complications   Last HgB A1C per patient/outside source 8.6 %  11/09/16   How often do you check your blood sugar? 1-2 times/day   Fasting Blood glucose range (mg/dL) 70-129  Pt reports FBG's 115-120's mg/dL - he works 2nd and 3rd shift. He checks most blood sugars before meals with reading of 132 mg/dL today. He reports some readings have been mid 200's mg/dL.    Have you had a dilated eye exam in the past 12 months? Yes   Have you had a dental exam in the past 12 months? Yes   Are you checking your feet? Yes   How many days per week are you checking your feet? 1     Dietary Intake   Breakfast eggs with cheese and bacon, cereal and milk with banana, pancakes and light syrup   Snack (morning) fruit, nuts   Lunch grilled or roasted chicken, occasional burger and fries   Snack (afternoon) fruit, nuts, yogurt parfait from [employer], chicken, [place] burger with squash, cabbage, occasional salads   Beverage(s) water, energy drinks, coffee with sugar, juice     Exercise   Exercise Type Light (walking / raking leaves)   How many days per week to you exercise? 3   How many minutes per day do you exercise? 30   Total minutes per week of exercise 90     Patient Education   Previous Diabetes Education  Yes (please comment)  when initially diagnosed   Disease state  Definition of diabetes, type 1 and 2, and the diagnosis of diabetes;Explored patient's options for treatment of their diabetes   Nutrition management  Role of diet in the treatment of diabetes and the relationship between the three main macronutrients and blood glucose level;Reviewed blood glucose goals for pre and post meals and how to evaluate the patients' food intake on their blood glucose  level.;Information on hints to eating out and maintain blood glucose control.;Other (comment)  Reviewed amount of carbs, caffeine in energy drinks. Avoid grapefruit with statins.    Physical activity and exercise  Role of exercise on diabetes management, blood pressure control and cardiac health.   Medications Taught/reviewed insulin injection, site rotation, insulin storage and needle disposal.;Reviewed patients medication for diabetes, action, purpose, timing of dose and side effects.   Monitoring Purpose and frequency of SMBG.;Taught/discussed recording of test results and interpretation of SMBG.;Identified appropriate SMBG and/or A1C goals.   Chronic complications Relationship between chronic complications and blood glucose control   Psychosocial adjustment Identified and addressed patients feelings and concerns about diabetes     Individualized Goals (developed by patient)   Reducing Risk Improve blood sugars Decrease medications Lose weight Lead a healthier lifestyle Become more fit     Outcomes   Expected Outcomes Demonstrated interest in learning. Expect positive outcomes   Future DMSE 2 wks      Individualized Plan for Diabetes Self-Management Training:   Learning Objective:  Patient will have a greater understanding of diabetes self-management. Patient education plan is to attend individual and/or group sessions per assessed needs and concerns.   Plan:   Patient Instructions  Check blood sugars 2 x day - fasting and  2 hrs after meal   Bring blood sugar records to the next appointment Exercise: Continue walking  for 30  minutes   3 days a week and gradually increase to 30 minutes 5 x week Avoid sugar sweetened drinks (energy drinks, coffee, juices) Eat 3 meals day, 2-3  snacks a day Space meals 4-6 hours apart Allow 2-3 hours between eating meals and snacks Complete 3 Day Food Record and bring to next appt Rotate injection sites Return for appointment on:  Thursday  February 03, 2017 at 1:15 pm with Freda Munro (nurse)  Expected Outcomes:  Demonstrated interest in learning. Expect positive outcomes  Education material provided:  General Meal Planning Guidelines Simple Meal Plan 3 Day Food Record  If problems or questions, patient to contact team via:  Johny Drilling, RN, CCM, CDE [phone]  Future DSME appointment: 2 wks  Thursday February 03, 2017 for appointment with the nurse; he didn't want to follow up with dietitian at this time

## 2017-01-19 ENCOUNTER — Ambulatory Visit: Payer: BLUE CROSS/BLUE SHIELD | Admitting: Surgery

## 2017-01-29 ENCOUNTER — Other Ambulatory Visit: Payer: Self-pay | Admitting: Family Medicine

## 2017-01-29 NOTE — Telephone Encounter (Signed)
rx approved, last Cr normal, no hyperK+

## 2017-02-03 ENCOUNTER — Ambulatory Visit: Payer: BLUE CROSS/BLUE SHIELD | Admitting: *Deleted

## 2017-02-04 ENCOUNTER — Encounter (INDEPENDENT_AMBULATORY_CARE_PROVIDER_SITE_OTHER): Payer: BLUE CROSS/BLUE SHIELD | Admitting: Ophthalmology

## 2017-02-04 DIAGNOSIS — I10 Essential (primary) hypertension: Secondary | ICD-10-CM | POA: Diagnosis not present

## 2017-02-04 DIAGNOSIS — H35033 Hypertensive retinopathy, bilateral: Secondary | ICD-10-CM

## 2017-02-04 DIAGNOSIS — E113313 Type 2 diabetes mellitus with moderate nonproliferative diabetic retinopathy with macular edema, bilateral: Secondary | ICD-10-CM

## 2017-02-04 DIAGNOSIS — E11311 Type 2 diabetes mellitus with unspecified diabetic retinopathy with macular edema: Secondary | ICD-10-CM | POA: Diagnosis not present

## 2017-02-04 DIAGNOSIS — H43813 Vitreous degeneration, bilateral: Secondary | ICD-10-CM

## 2017-02-06 LAB — MICROALBUMIN, URINE: Microalb, Ur: 32.3

## 2017-02-07 ENCOUNTER — Ambulatory Visit: Payer: BLUE CROSS/BLUE SHIELD | Admitting: *Deleted

## 2017-03-03 ENCOUNTER — Encounter (INDEPENDENT_AMBULATORY_CARE_PROVIDER_SITE_OTHER): Payer: BLUE CROSS/BLUE SHIELD | Admitting: Ophthalmology

## 2017-03-08 ENCOUNTER — Other Ambulatory Visit: Payer: Self-pay | Admitting: Family Medicine

## 2017-03-08 NOTE — Telephone Encounter (Signed)
Oct 2017 sgpt and lipids reviewed; Rx can be approved but I need to know what strength he is actually taking The med list says 40 mg but the request is for 80 mg Please find out what he's been taking and if someone else switched it Thank you

## 2017-03-08 NOTE — Telephone Encounter (Signed)
Called patient he states it was decreased to 40 mg but he is still getting the 80 mg and cutting it in half to make 40.

## 2017-03-08 NOTE — Telephone Encounter (Signed)
New Rx sent to reflect how he is taking it

## 2017-03-11 ENCOUNTER — Encounter (INDEPENDENT_AMBULATORY_CARE_PROVIDER_SITE_OTHER): Payer: BLUE CROSS/BLUE SHIELD | Admitting: Ophthalmology

## 2017-03-11 DIAGNOSIS — H35033 Hypertensive retinopathy, bilateral: Secondary | ICD-10-CM

## 2017-03-11 DIAGNOSIS — E113292 Type 2 diabetes mellitus with mild nonproliferative diabetic retinopathy without macular edema, left eye: Secondary | ICD-10-CM

## 2017-03-11 DIAGNOSIS — H43813 Vitreous degeneration, bilateral: Secondary | ICD-10-CM | POA: Diagnosis not present

## 2017-03-11 DIAGNOSIS — I10 Essential (primary) hypertension: Secondary | ICD-10-CM

## 2017-03-11 DIAGNOSIS — E11311 Type 2 diabetes mellitus with unspecified diabetic retinopathy with macular edema: Secondary | ICD-10-CM

## 2017-03-11 DIAGNOSIS — E113311 Type 2 diabetes mellitus with moderate nonproliferative diabetic retinopathy with macular edema, right eye: Secondary | ICD-10-CM | POA: Diagnosis not present

## 2017-03-12 ENCOUNTER — Other Ambulatory Visit: Payer: Self-pay | Admitting: Family Medicine

## 2017-03-12 NOTE — Telephone Encounter (Signed)
Last Cr reviewed 

## 2017-03-14 ENCOUNTER — Telehealth: Payer: Self-pay | Admitting: Family Medicine

## 2017-03-14 NOTE — Telephone Encounter (Signed)
ERRENOUS °

## 2017-03-15 ENCOUNTER — Encounter: Payer: Self-pay | Admitting: General Surgery

## 2017-03-15 ENCOUNTER — Other Ambulatory Visit: Payer: Self-pay

## 2017-03-15 ENCOUNTER — Ambulatory Visit (INDEPENDENT_AMBULATORY_CARE_PROVIDER_SITE_OTHER): Payer: BLUE CROSS/BLUE SHIELD | Admitting: General Surgery

## 2017-03-15 VITALS — BP 149/97 | HR 97 | Temp 98.4°F | Ht 70.0 in | Wt 290.0 lb

## 2017-03-15 DIAGNOSIS — K429 Umbilical hernia without obstruction or gangrene: Secondary | ICD-10-CM | POA: Diagnosis not present

## 2017-03-15 NOTE — Patient Instructions (Signed)
You have been seen today for a diastasis recti and umbilical hernia. Your goal weight is approximately 200 lbs. Weight loss will help with both diagnosis. Please review the symptoms of an incarcerated hernia and if you experience this, call our office or be seen in the emergency room immediately.    Hernia, Adult A hernia is the bulging of an organ or tissue through a weak spot in the muscles of the abdomen (abdominal wall). Hernias develop most often near the navel or groin. There are many kinds of hernias. Common kinds include:  Femoral hernia. This kind of hernia develops under the groin in the upper thigh area.  Inguinal hernia. This kind of hernia develops in the groin or scrotum.  Umbilical hernia. This kind of hernia develops near the navel.  Hiatal hernia. This kind of hernia causes part of the stomach to be pushed up into the chest.  Incisional hernia. This kind of hernia bulges through a scar from an abdominal surgery. What are the causes? This condition may be caused by:  Heavy lifting.  Coughing over a long period of time.  Straining to have a bowel movement.  An incision made during an abdominal surgery.  A birth defect (congenital defect).  Excess weight or obesity.  Smoking.  Poor nutrition.  Cystic fibrosis.  Excess fluid in the abdomen.  Undescended testicles. What are the signs or symptoms? Symptoms of a hernia include:  A lump on the abdomen. This is the first sign of a hernia. The lump may become more obvious with standing, straining, or coughing. It may get bigger over time if it is not treated or if the condition causing it is not treated.  Pain. A hernia is usually painless, but it may become painful over time if treatment is delayed. The pain is usually dull and may get worse with standing or lifting heavy objects. Sometimes a hernia gets tightly squeezed in the weak spot (strangulated) or stuck there (incarcerated) and causes additional symptoms.  These symptoms may include:  Vomiting.  Nausea.  Constipation.  Irritability. How is this diagnosed? A hernia may be diagnosed with:  A physical exam. During the exam your health care provider may ask you to cough or to make a specific movement, because a hernia is usually more visible when you move.  Imaging tests. These can include:  X-rays.  Ultrasound.  CT scan. How is this treated? A hernia that is small and painless may not need to be treated. A hernia that is large or painful may be treated with surgery. Inguinal hernias may be treated with surgery to prevent incarceration or strangulation. Strangulated hernias are always treated with surgery, because lack of blood to the trapped organ or tissue can cause it to die. Surgery to treat a hernia involves pushing the bulge back into place and repairing the weak part of the abdomen. Follow these instructions at home:  Avoid straining.  Do not lift anything heavier than 10 lb (4.5 kg).  Lift with your leg muscles, not your back muscles. This helps avoid strain.  When coughing, try to cough gently.  Prevent constipation. Constipation leads to straining with bowel movements, which can make a hernia worse or cause a hernia repair to break down. You can prevent constipation by:  Eating a high-fiber diet that includes plenty of fruits and vegetables.  Drinking enough fluids to keep your urine clear or pale yellow. Aim to drink 6-8 glasses of water per day.  Using a stool softener as  directed by your health care provider.  Lose weight, if you are overweight.  Do not use any tobacco products, including cigarettes, chewing tobacco, or electronic cigarettes. If you need help quitting, ask your health care provider.  Keep all follow-up visits as directed by your health care provider. This is important. Your health care provider may need to monitor your condition. Contact a health care provider if:  You have swelling, redness,  and pain in the affected area.  Your bowel habits change. Get help right away if:  You have a fever.  You have abdominal pain that is getting worse.  You feel nauseous or you vomit.  You cannot push the hernia back in place by gently pressing on it while you are lying down.  The hernia:  Changes in shape or size.  Is stuck outside the abdomen.  Becomes discolored.  Feels hard or tender. This information is not intended to replace advice given to you by your health care provider. Make sure you discuss any questions you have with your health care provider. Document Released: 12/13/2005 Document Revised: 05/12/2016 Document Reviewed: 10/23/2014 Elsevier Interactive Patient Education  2017 [employer].

## 2017-03-16 NOTE — Progress Notes (Signed)
Patient ID: Clarence Black, male   DOB: 1966-06-17, 51 y.o.   MRN: [id number]  CC: Umbilical Hernia  HPI Clarence Black is a 51 y.o. male who presents to clinic for evaluation of an umbilical hernia. Patient reports that he never has any pain from his hernia and that it has been there at least 6 months. He is unsure if it is getting any bigger or not. Patient also reports a upper midline bulge when going from lying to sitting that is visible by his wife. It too is without symptoms. And only recently been noticed. Patient is otherwise in his usual state of health and denies any fevers, chills, nausea, vomiting, chest pain, shortness of breath, diarrhea, constipation.  HPI  Past Medical History:  Diagnosis Date  . Diabetes mellitus without complication ([place])   . Diabetic retinopathy ([place])   . Diastasis recti 12/31/2016  . Erectile dysfunction   . Erectile dysfunction associated with type 2 diabetes mellitus ([place]) 02/20/2015  . 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 Hyperlipidemia LDL goal <100 06/13/2015  . Hypertension   . Hypertension goal BP (blood pressure) < 130/80 06/13/2015  . Hypogonadism, male   . Obesity, Class III, BMI 40-49.9 (morbid obesity) ([place]) 02/20/2015  . Type 2 diabetes mellitus ([place]) 10/01/2015  . Uncontrolled type 2 diabetes mellitus with peripheral circulatory disorder ([place]) 06/13/2015    Past Surgical History:  Procedure Laterality Date  . COLONOSCOPY  2013    Family History  Problem Relation Age of Onset  . Diabetes Mother   . Kidney disease Mother   . Heart attack Mother   . Diabetes Brother   . Diabetes Brother   . Diabetes Maternal Grandfather   . Hypertension Maternal Grandfather   . Stroke Maternal Grandfather     Social History Social History  Substance Use Topics  . Smoking status: Never Smoker  . Smokeless tobacco: Never Used  . Alcohol use No    No Known Allergies  Current Outpatient Prescriptions  Medication Sig Dispense Refill  . amLODipine  (NORVASC) 5 MG tablet TAKE 1 TABLET (5 MG TOTAL) BY MOUTH DAILY. (FOR BLOOD PRESSURE) 90 tablet 3  . aspirin EC 81 MG tablet Take 1 tablet (81 mg total) by mouth daily. To help prevent heart attack 90 tablet 1  . atorvastatin (LIPITOR) 80 MG tablet Take 0.5 tablets (40 mg total) by mouth at bedtime. 45 tablet 2  . dapagliflozin propanediol (FARXIGA) 10 MG TABS tablet Take 1 mg by mouth 1 day or 1 dose.    Marland Kitchen glipiZIDE-metformin (METAGLIP) 2.5-500 MG tablet Take 2 tablets by mouth 2 (two) times daily before a meal.    . glucose blood (ONE TOUCH ULTRA TEST) test strip Use as instructed 300 each 3  . hydrochlorothiazide (HYDRODIURIL) 25 MG tablet TAKE 1 TABLET (25 MG TOTAL) BY MOUTH DAILY. 90 tablet 1  . Insulin Pen Needle (PEN NEEDLES) 31G X 6 MM MISC For use with injectable medicine, once a day 30 each 2  . Liraglutide (VICTOZA) 18 MG/3ML SOPN Inject 0.3 mLs (1.8 mg total) into the skin daily. 15 pen 1  . lisinopril (PRINIVIL,ZESTRIL) 40 MG tablet TAKE 1 TABLET (40 MG TOTAL) BY MOUTH DAILY. (FOR KIDNEY PROTECTION AND BLOOD PRESSURE) 90 tablet 1  . Multiple Vitamin (MULTIVITAMIN) tablet Take 1 tablet by mouth daily.    Glory Rosebush DELICA LANCETS FINE MISC 1 Device by Does not apply route 3 (three) times daily. 300 each 3  . simvastatin (ZOCOR) 40 MG tablet Take  1 tablet by mouth 1 day or 1 dose.    . sitaGLIPtin (JANUVIA) 100 MG tablet Take 100 mg by mouth daily.     No current facility-administered medications for this visit.      Review of Systems A Multi-point review of systems was asked and was negative except for the findings documented in the history of present illness  Physical Exam Blood pressure (!) 149/97, pulse 97, temperature 98.4 F (36.9 C), temperature source Oral, height 5\' 10"  (1.778 m), weight 131.5 kg (290 lb). CONSTITUTIONAL: No acute distress. EYES: Pupils are equal, round, and reactive to light, Sclera are non-icteric. EARS, NOSE, MOUTH AND THROAT: The oropharynx is  clear. The oral mucosa is pink and moist. Hearing is intact to voice. LYMPH NODES:  Lymph nodes in the neck are normal. RESPIRATORY:  Lungs are clear. There is normal respiratory effort, with equal breath sounds bilaterally, and without pathologic use of accessory muscles. CARDIOVASCULAR: Heart is regular without murmurs, gallops, or rubs. GI: The abdomen is very large, soft, nontender, and nondistended. There is a visible umbilical hernia that is soft and easily reducible. Also appears to be evidence of a diastases recti without any complication.. There is no hepatosplenomegaly. There are normal bowel sounds in all quadrants. GU: Rectal deferred.   MUSCULOSKELETAL: Normal muscle strength and tone. No cyanosis or edema.   SKIN: Turgor is good and there are no pathologic skin lesions or ulcers. NEUROLOGIC: Motor and sensation is grossly normal. Cranial nerves are grossly intact. PSYCH:  Oriented to person, place and time. Affect is normal.  Data Reviewed No images and labs to review I have personally reviewed the patient's imaging, laboratory findings and medical records.    Assessment    Asymptomatic umbilical hernia    Plan    51 year old male who is morbidly obese with a symptomatic umbilical hernia and diastases recti. Had long conversation about both the diastases and the hernia. Discussed the signs and symptoms of incarceration or strangulation and that both of those indicate need for urgent hernia repair. However given his lack of symptoms and his large size counseled him that an elective repair of her hernia at this point would be unwise given the high risk of recurrence or wound complication. Patient voiced understanding and will follow-up in clinic on an as-needed basis.     Time spent with the patient was 30 minutes, with more than 50% of the time spent in face-to-face education, counseling and care coordination.     Clayburn Pert, MD FACS General Surgeon 03/16/2017, 9:36  AM

## 2017-03-21 ENCOUNTER — Encounter: Payer: Self-pay | Admitting: *Deleted

## 2017-04-08 ENCOUNTER — Other Ambulatory Visit: Payer: Self-pay | Admitting: Family Medicine

## 2017-04-08 ENCOUNTER — Encounter (INDEPENDENT_AMBULATORY_CARE_PROVIDER_SITE_OTHER): Payer: BLUE CROSS/BLUE SHIELD | Admitting: Ophthalmology

## 2017-04-08 DIAGNOSIS — E11311 Type 2 diabetes mellitus with unspecified diabetic retinopathy with macular edema: Secondary | ICD-10-CM | POA: Diagnosis not present

## 2017-04-08 DIAGNOSIS — E113313 Type 2 diabetes mellitus with moderate nonproliferative diabetic retinopathy with macular edema, bilateral: Secondary | ICD-10-CM | POA: Diagnosis not present

## 2017-04-08 DIAGNOSIS — H2513 Age-related nuclear cataract, bilateral: Secondary | ICD-10-CM | POA: Diagnosis not present

## 2017-04-08 DIAGNOSIS — I10 Essential (primary) hypertension: Secondary | ICD-10-CM | POA: Diagnosis not present

## 2017-04-08 DIAGNOSIS — H35033 Hypertensive retinopathy, bilateral: Secondary | ICD-10-CM

## 2017-04-08 DIAGNOSIS — H43813 Vitreous degeneration, bilateral: Secondary | ICD-10-CM

## 2017-04-08 NOTE — Telephone Encounter (Signed)
Spoke with pt and he stated he is not on meloxicam. It was temporary and he is not on Januvia. Took Januiva off off his med list.

## 2017-04-08 NOTE — Telephone Encounter (Signed)
Please contact patient Someone added Januvia back to his medicine list on January 19th Please make sure he is not taking that They also removed meloxicam, saying it was prescribed in error Please call him and find out what that's all about Did someone tell him to stop?

## 2017-04-13 ENCOUNTER — Other Ambulatory Visit: Payer: Self-pay | Admitting: Family Medicine

## 2017-04-29 ENCOUNTER — Other Ambulatory Visit: Payer: Self-pay | Admitting: Family Medicine

## 2017-04-29 NOTE — Telephone Encounter (Signed)
Medicine meloxicam was removed "error" by CMA at surgeon's office I called pt about this He said it was short term for knee pain; he no longer has pain, doesn't need it; no "error", just completed course Must have been automatic refill

## 2017-05-06 ENCOUNTER — Encounter (INDEPENDENT_AMBULATORY_CARE_PROVIDER_SITE_OTHER): Payer: BLUE CROSS/BLUE SHIELD | Admitting: Ophthalmology

## 2017-05-06 DIAGNOSIS — E113313 Type 2 diabetes mellitus with moderate nonproliferative diabetic retinopathy with macular edema, bilateral: Secondary | ICD-10-CM | POA: Diagnosis not present

## 2017-05-06 DIAGNOSIS — E11311 Type 2 diabetes mellitus with unspecified diabetic retinopathy with macular edema: Secondary | ICD-10-CM | POA: Diagnosis not present

## 2017-05-06 DIAGNOSIS — H35033 Hypertensive retinopathy, bilateral: Secondary | ICD-10-CM | POA: Diagnosis not present

## 2017-05-06 DIAGNOSIS — I10 Essential (primary) hypertension: Secondary | ICD-10-CM | POA: Diagnosis not present

## 2017-05-06 DIAGNOSIS — H2513 Age-related nuclear cataract, bilateral: Secondary | ICD-10-CM

## 2017-05-06 DIAGNOSIS — H43813 Vitreous degeneration, bilateral: Secondary | ICD-10-CM | POA: Diagnosis not present

## 2017-05-28 ENCOUNTER — Other Ambulatory Visit: Payer: Self-pay | Admitting: Family Medicine

## 2017-06-03 ENCOUNTER — Encounter (INDEPENDENT_AMBULATORY_CARE_PROVIDER_SITE_OTHER): Payer: BLUE CROSS/BLUE SHIELD | Admitting: Ophthalmology

## 2017-06-03 DIAGNOSIS — H2513 Age-related nuclear cataract, bilateral: Secondary | ICD-10-CM | POA: Diagnosis not present

## 2017-06-03 DIAGNOSIS — E11311 Type 2 diabetes mellitus with unspecified diabetic retinopathy with macular edema: Secondary | ICD-10-CM | POA: Diagnosis not present

## 2017-06-03 DIAGNOSIS — H43813 Vitreous degeneration, bilateral: Secondary | ICD-10-CM | POA: Diagnosis not present

## 2017-06-03 DIAGNOSIS — I10 Essential (primary) hypertension: Secondary | ICD-10-CM | POA: Diagnosis not present

## 2017-06-03 DIAGNOSIS — E113212 Type 2 diabetes mellitus with mild nonproliferative diabetic retinopathy with macular edema, left eye: Secondary | ICD-10-CM | POA: Diagnosis not present

## 2017-06-03 DIAGNOSIS — H35033 Hypertensive retinopathy, bilateral: Secondary | ICD-10-CM

## 2017-06-03 DIAGNOSIS — E113311 Type 2 diabetes mellitus with moderate nonproliferative diabetic retinopathy with macular edema, right eye: Secondary | ICD-10-CM | POA: Diagnosis not present

## 2017-06-21 LAB — HEMOGLOBIN A1C: HEMOGLOBIN A1C: 8.1

## 2017-07-01 ENCOUNTER — Encounter (INDEPENDENT_AMBULATORY_CARE_PROVIDER_SITE_OTHER): Payer: BLUE CROSS/BLUE SHIELD | Admitting: Ophthalmology

## 2017-07-01 DIAGNOSIS — H35033 Hypertensive retinopathy, bilateral: Secondary | ICD-10-CM

## 2017-07-01 DIAGNOSIS — E113313 Type 2 diabetes mellitus with moderate nonproliferative diabetic retinopathy with macular edema, bilateral: Secondary | ICD-10-CM

## 2017-07-01 DIAGNOSIS — E11311 Type 2 diabetes mellitus with unspecified diabetic retinopathy with macular edema: Secondary | ICD-10-CM | POA: Diagnosis not present

## 2017-07-01 DIAGNOSIS — I10 Essential (primary) hypertension: Secondary | ICD-10-CM | POA: Diagnosis not present

## 2017-07-01 DIAGNOSIS — H43813 Vitreous degeneration, bilateral: Secondary | ICD-10-CM

## 2017-07-02 ENCOUNTER — Other Ambulatory Visit: Payer: Self-pay | Admitting: Family Medicine

## 2017-07-04 NOTE — Telephone Encounter (Signed)
Patient notified

## 2017-07-04 NOTE — Telephone Encounter (Signed)
Patient is seeing Dr. Gabriel Carina, endocrinologist, for his diabetes Please ask him to get all diabetic-related medicines and supplies from her office Thank you

## 2017-07-20 ENCOUNTER — Other Ambulatory Visit: Payer: Self-pay | Admitting: Family Medicine

## 2017-07-20 NOTE — Telephone Encounter (Signed)
Last Cr and K+ reviewed; Rx approved 

## 2017-08-05 ENCOUNTER — Encounter (INDEPENDENT_AMBULATORY_CARE_PROVIDER_SITE_OTHER): Payer: BLUE CROSS/BLUE SHIELD | Admitting: Ophthalmology

## 2017-08-15 ENCOUNTER — Encounter (INDEPENDENT_AMBULATORY_CARE_PROVIDER_SITE_OTHER): Payer: BLUE CROSS/BLUE SHIELD | Admitting: Ophthalmology

## 2017-08-19 ENCOUNTER — Encounter (INDEPENDENT_AMBULATORY_CARE_PROVIDER_SITE_OTHER): Payer: BLUE CROSS/BLUE SHIELD | Admitting: Ophthalmology

## 2017-08-19 DIAGNOSIS — I10 Essential (primary) hypertension: Secondary | ICD-10-CM | POA: Diagnosis not present

## 2017-08-19 DIAGNOSIS — H35033 Hypertensive retinopathy, bilateral: Secondary | ICD-10-CM | POA: Diagnosis not present

## 2017-08-19 DIAGNOSIS — H2512 Age-related nuclear cataract, left eye: Secondary | ICD-10-CM | POA: Diagnosis not present

## 2017-08-19 DIAGNOSIS — E11311 Type 2 diabetes mellitus with unspecified diabetic retinopathy with macular edema: Secondary | ICD-10-CM

## 2017-08-19 DIAGNOSIS — E113313 Type 2 diabetes mellitus with moderate nonproliferative diabetic retinopathy with macular edema, bilateral: Secondary | ICD-10-CM

## 2017-08-19 DIAGNOSIS — H43813 Vitreous degeneration, bilateral: Secondary | ICD-10-CM

## 2017-08-24 ENCOUNTER — Encounter: Payer: BLUE CROSS/BLUE SHIELD | Admitting: Family Medicine

## 2017-08-31 ENCOUNTER — Other Ambulatory Visit: Payer: Self-pay | Admitting: Family Medicine

## 2017-09-16 ENCOUNTER — Encounter (INDEPENDENT_AMBULATORY_CARE_PROVIDER_SITE_OTHER): Payer: BLUE CROSS/BLUE SHIELD | Admitting: Ophthalmology

## 2017-09-23 ENCOUNTER — Encounter: Payer: Self-pay | Admitting: Family Medicine

## 2017-09-23 ENCOUNTER — Ambulatory Visit (INDEPENDENT_AMBULATORY_CARE_PROVIDER_SITE_OTHER): Payer: BLUE CROSS/BLUE SHIELD | Admitting: Family Medicine

## 2017-09-23 VITALS — BP 136/88 | HR 98 | Temp 98.1°F | Resp 14 | Ht 70.01 in | Wt 291.7 lb

## 2017-09-23 DIAGNOSIS — B309 Viral conjunctivitis, unspecified: Secondary | ICD-10-CM | POA: Diagnosis not present

## 2017-09-23 DIAGNOSIS — Z23 Encounter for immunization: Secondary | ICD-10-CM | POA: Diagnosis not present

## 2017-09-23 DIAGNOSIS — E785 Hyperlipidemia, unspecified: Secondary | ICD-10-CM | POA: Diagnosis not present

## 2017-09-23 DIAGNOSIS — I10 Essential (primary) hypertension: Secondary | ICD-10-CM | POA: Diagnosis not present

## 2017-09-23 DIAGNOSIS — K439 Ventral hernia without obstruction or gangrene: Secondary | ICD-10-CM | POA: Diagnosis not present

## 2017-09-23 DIAGNOSIS — E1151 Type 2 diabetes mellitus with diabetic peripheral angiopathy without gangrene: Secondary | ICD-10-CM

## 2017-09-23 DIAGNOSIS — K429 Umbilical hernia without obstruction or gangrene: Secondary | ICD-10-CM | POA: Diagnosis not present

## 2017-09-23 DIAGNOSIS — Z0000 Encounter for general adult medical examination without abnormal findings: Secondary | ICD-10-CM

## 2017-09-23 DIAGNOSIS — E1165 Type 2 diabetes mellitus with hyperglycemia: Secondary | ICD-10-CM | POA: Diagnosis not present

## 2017-09-23 DIAGNOSIS — IMO0002 Reserved for concepts with insufficient information to code with codable children: Secondary | ICD-10-CM

## 2017-09-23 MED ORDER — NEOMYCIN-POLYMYXIN-GRAMICIDIN 1.75-10000-.025 OP SOLN
1.0000 [drp] | Freq: Four times a day (QID) | OPHTHALMIC | 0 refills | Status: DC
Start: 2017-09-23 — End: 2017-10-04

## 2017-09-23 NOTE — Assessment & Plan Note (Signed)
Reviewed his last lipid panel with him, along with targets; continue statin; try to lose weight and eat better

## 2017-09-23 NOTE — Assessment & Plan Note (Signed)
Encouraged weight loss 

## 2017-09-23 NOTE — Progress Notes (Signed)
Patient ID: Clarence Black, male   DOB: July 09, 1966, 51 y.o.   MRN: [id number]   Subjective:   Clarence Black is a 51 y.o. male here for a complete physical exam  Interim issues since last visit: appt with eye doctor next week; just noticed redness of the OD only; no lifting or straining; little bit of sinus issues lately  Seeing endo for diabetes; last A1c was in June; he was taking medicine  USPSTF grade A and B recommendations Depression:  Depression screen [facility] 2/9 09/23/2017 01/14/2017 12/31/2016 11/12/2016 10/22/2016  Decreased Interest 0 0 0 0 0  Down, Depressed, Hopeless 0 0 0 0 0  PHQ - 2 Score 0 0 0 0 0   Hypertension: BP Readings from Last 3 Encounters:  09/23/17 136/88  03/15/17 (!) 149/97  01/14/17 (!) 164/90   Obesity: portion sizes are a problem Wt Readings from Last 3 Encounters:  09/23/17 291 lb 11.2 oz (132.3 kg)  03/15/17 290 lb (131.5 kg)  01/14/17 294 lb 1.6 oz (133.4 kg)   BMI Readings from Last 3 Encounters:  09/23/17 41.84 kg/m  03/15/17 41.61 kg/m  01/14/17 42.20 kg/m    Alcohol: no Tobacco use: never HIV, hep B, hep C: not interested Married STD testing and prevention (chl/gon/syphilis): not interested Lipids:  Lab Results  Component Value Date   CHOL 130 10/22/2016   CHOL 130 07/13/2016   CHOL 205 (H) 12/16/2015   Lab Results  Component Value Date   HDL 42 10/22/2016   HDL 48 07/13/2016   HDL 36 (L) 12/16/2015   Lab Results  Component Value Date   LDLCALC 62 10/22/2016   LDLCALC 58 07/13/2016   LDLCALC 129 (H) 12/16/2015   Lab Results  Component Value Date   TRIG 132 10/22/2016   TRIG 122 07/13/2016   TRIG 198 (H) 12/16/2015   Lab Results  Component Value Date   CHOLHDL 3.1 10/22/2016   CHOLHDL 2.7 07/13/2016   CHOLHDL 5.7 (H) 12/16/2015   No results found for: LDLDIRECT Glucose:  Glucose, Bld  Date Value Ref Range Status  10/22/2016 102 (H) 65 - 99 mg/dL Final  07/13/2016 156 (H) 65 - 99 mg/dL Final   Colorectal  cancer: no fam hx; colonoscopy was done in 2013; patient does not know why or when next is due; we spent significant time hunting through hospital chart, old chart here, and cannot find evidence of report or path or if polyps were present  Prostate cancer:  Lab Results  Component Value Date   PSA 0.3ng/dL 05/27/2015   Lung cancer:  n/a AAA: n/a Aspirin: not taking baby aspirin, buyt will start Diet: does eat veggies Exercise: no regular exercise; will try to more active Skin cancer: nothing worrisome; cyst on the lower back left side  Past Medical History:  Diagnosis Date  . Diabetes mellitus without complication ([place])   . Diabetic retinopathy ([place])   . Diastasis recti 12/31/2016  . Erectile dysfunction   . Erectile dysfunction associated with type 2 diabetes mellitus ([place]) 02/20/2015  . 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 Hyperlipidemia LDL goal <100 06/13/2015  . Hypertension   . Hypertension goal BP (blood pressure) < 130/80 06/13/2015  . Hypogonadism, male   . Obesity, Class III, BMI 40-49.9 (morbid obesity) ([place]) 02/20/2015  . Type 2 diabetes mellitus ([place]) 10/01/2015  . Uncontrolled type 2 diabetes mellitus with peripheral circulatory disorder ([place]) 06/13/2015   Past Surgical History:  Procedure Laterality Date  . COLONOSCOPY  2013  Family History  Problem Relation Age of Onset  . Diabetes Mother   . Kidney disease Mother   . Heart attack Mother   . Diabetes Brother   . Diabetes Brother   . Diabetes Maternal Grandfather   . Hypertension Maternal Grandfather   . Stroke Maternal Grandfather    Social History  Substance Use Topics  . Smoking status: Never Smoker  . Smokeless tobacco: Never Used  . Alcohol use No   Review of Systems  Constitutional: Negative for fever and unexpected weight change.  Eyes: Positive for redness. Negative for pain.  Cardiovascular: Negative for chest pain and leg swelling.  Gastrointestinal: Negative for blood in stool.  Endocrine: Negative for  polydipsia.  Genitourinary: Negative for hematuria.  Musculoskeletal: Negative for arthralgias.  Allergic/Immunologic: Negative for food allergies.  Neurological: Negative for tremors.  Hematological: Does not bruise/bleed easily.  Psychiatric/Behavioral: Negative for dysphoric mood.    Objective:   Vitals:   09/23/17 1517  BP: 136/88  Pulse: 98  Resp: 14  Temp: 98.1 F (36.7 C)  TempSrc: Oral  SpO2: 99%  Weight: 291 lb 11.2 oz (132.3 kg)  Height: 5' 10.01" (1.778 m)   Body mass index is 41.84 kg/m. Wt Readings from Last 3 Encounters:  09/23/17 291 lb 11.2 oz (132.3 kg)  03/15/17 290 lb (131.5 kg)  01/14/17 294 lb 1.6 oz (133.4 kg)   Physical Exam  Constitutional: He appears well-developed and well-nourished. No distress.  HENT:  Head: Normocephalic and atraumatic.  Nose: Nose normal.  Mouth/Throat: Oropharynx is clear and moist.  Eyes: EOM are normal. Right eye exhibits no discharge. Left eye exhibits no discharge. Right conjunctiva is injected. Left conjunctiva is not injected. Left conjunctiva has no hemorrhage. No scleral icterus. Right eye exhibits normal extraocular motion. Left eye exhibits normal extraocular motion. Pupils are equal.    Erythema and injection of the OD; no mattering  Neck: No JVD present. No thyromegaly present.  Cardiovascular: Normal rate, regular rhythm and normal heart sounds.   Pulmonary/Chest: Effort normal and breath sounds normal. No respiratory distress. He has no wheezes. He has no rales.  Abdominal: Soft. Bowel sounds are normal. He exhibits no distension. There is no tenderness. There is no guarding. A hernia is present. Hernia confirmed positive in the ventral area.    Umbilical hernia with ventral midline hernia  Musculoskeletal: Normal range of motion. He exhibits no edema.  Lymphadenopathy:    He has no cervical adenopathy.  Neurological: He is alert. He displays normal reflexes. He exhibits normal muscle tone. Coordination  normal.  Skin: Skin is warm and dry. No rash noted. He is not diaphoretic. No erythema. No pallor.     Sebaceous cyst with two open comedonal pores; no erythema; no fluctuance; on the mid to lower back left of the midline  Psychiatric: He has a normal mood and affect. His behavior is normal. Judgment and thought content normal.    Assessment/Plan:   Problem List Items Addressed This Visit      Cardiovascular and Mediastinum   Uncontrolled type 2 diabetes mellitus with peripheral circulatory disorder (HCC) (Chronic)    Managed by Dr. Gabriel Carina; we went through his medicine list and cleaned things up; I reviewed her note as well; he'll f/u with her and get A1c through her office; foot exam done by MD today; urine microalbumin:Cr UTD; he's going to work on eating better and losing weight      Relevant Medications   XIGDUO XR 04-999 MG TB24  glipiZIDE (GLUCOTROL XL) 10 MG 24 hr tablet   Hypertension goal BP (blood pressure) < 130/80 (Chronic)    A little above target; patient will work on weight loss to get to goal; he did not want to increase medicine at this time; I did share with him (see AVS) the maximum dose of amlodipine so he can increase from 5 mg to 10 mg daily if needed to get BP under 130/80; if he needs this dose, I asked him to call me so we can get new Rx for him        Other   Ventral hernia without obstruction or gangrene    Evaluated by surgeon; discussed importance of weight loss      Umbilical hernia without obstruction and without gangrene    Encouraged weight loss; he has seen surgeon      Preventative health care - Primary    USPSTF grade A and B recommendations reviewed with patient; age-appropriate recommendations, preventive care, screening tests, etc discussed and encouraged; healthy living encouraged; see AVS for patient education given to patient       Relevant Orders   Lipid panel   COMPLETE METABOLIC PANEL WITH GFR   TSH   CBC with  Differential/Platelet   Obesity, Class III, BMI 40-49.9 (morbid obesity) (HCC) (Chronic)    Encouraged weight loss      Relevant Medications   XIGDUO XR 04-999 MG TB24   glipiZIDE (GLUCOTROL XL) 10 MG 24 hr tablet   Hyperlipidemia LDL goal <100 (Chronic)    Reviewed his last lipid panel with him, along with targets; continue statin; try to lose weight and eat better       Other Visit Diagnoses    Needs flu shot       Relevant Orders   Flu Vaccine QUAD 6+ mos PF IM (Fluarix Quad PF) (Completed)   Acute viral conjunctivitis of right eye       no evidence of bacterial infection, but since it is Friday afternoon, Rx sent to pharmacy to fill if worsens; go to eye doctor if pain, worsening, loss of vis      Meds ordered this encounter  Medications  . XIGDUO XR 04-999 MG TB24    Sig: Take 2 tablets by mouth daily.    Refill:  11  . glipiZIDE (GLUCOTROL XL) 10 MG 24 hr tablet    Sig: Take 1 tablet by mouth daily.  Marland Kitchen neomycin-polymyxin-gramicidin (NEOSPORIN) 1.75-10000-.025 ophthalmic solution    Sig: Place 1 drop into the right eye 4 (four) times daily.    Dispense:  10 mL    Refill:  0    Leave on file until he calls for it   Orders Placed This Encounter  Procedures  . Flu Vaccine QUAD 6+ mos PF IM (Fluarix Quad PF)  . Lipid panel  . COMPLETE METABOLIC PANEL WITH GFR  . TSH  . CBC with Differential/Platelet    Follow up plan: Return in about 1 year (around 09/23/2018) for complete physical.  An After Visit Summary was printed and given to the patient.

## 2017-09-23 NOTE — Assessment & Plan Note (Signed)
USPSTF grade A and B recommendations reviewed with patient; age-appropriate recommendations, preventive care, screening tests, etc discussed and encouraged; healthy living encouraged; see AVS for patient education given to patient  

## 2017-09-23 NOTE — Assessment & Plan Note (Addendum)
Managed by Dr. Gabriel Carina; we went through his medicine list and cleaned things up; I reviewed her note as well; he'll f/u with her and get A1c through her office; foot exam done by MD today; urine microalbumin:Cr UTD; he's going to work on eating better and losing weight

## 2017-09-23 NOTE — Patient Instructions (Addendum)
If you need to take up to 10 mg of amlodipine to keep your pressure controlled (under 130/80), you have my blessing; just let me know The best way is going to be with weight loss Check out the information at familydoctor.org entitled "Nutrition for Weight Loss: What You Need to Know about Fad Diets" Try to lose between 1-2 pounds per week by taking in fewer calories and burning off more calories You can succeed by limiting portions, limiting foods dense in calories and fat, becoming more active, and drinking 8 glasses of water a day (64 ounces) Don't skip meals, especially breakfast, as skipping meals may alter your metabolism Do not use over-the-counter weight loss pills or gimmicks that claim rapid weight loss A healthy BMI (or body mass index) is between 18.5 and 24.9 You can calculate your ideal BMI at the [place] website [employer]fr  Health Maintenance, Male A healthy lifestyle and preventive care is important for your health and wellness. Ask your health care provider about what schedule of regular examinations is right for you. What should I know about weight and diet? Eat a Healthy Diet  Eat plenty of vegetables, fruits, whole grains, low-fat dairy products, and lean protein.  Do not eat a lot of foods high in solid fats, added sugars, or salt.  Maintain a Healthy Weight Regular exercise can help you achieve or maintain a healthy weight. You should:  Do at least 150 minutes of exercise each week. The exercise should increase your heart rate and make you sweat (moderate-intensity exercise).  Do strength-training exercises at least twice a week.  Watch Your Levels of Cholesterol and Blood Lipids  Have your blood tested for lipids and cholesterol every 5 years starting at 49 years of age. If you are at high risk for heart disease, you should start having your blood tested when you are 49 years old. You may need to have your  cholesterol levels checked more often if: ? Your lipid or cholesterol levels are high. ? You are older than 49 years of age. ? You are at high risk for heart disease.  What should I know about cancer screening? Many types of cancers can be detected early and may often be prevented. Lung Cancer  You should be screened every year for lung cancer if: ? You are a current smoker who has smoked for at least 30 years. ? You are a former smoker who has quit within the past 15 years.  Talk to your health care provider about your screening options, when you should start screening, and how often you should be screened.  Colorectal Cancer  Routine colorectal cancer screening usually begins at 49 years of age and should be repeated every 5-10 years until you are 49 years old. You may need to be screened more often if early forms of precancerous polyps or small growths are found. Your health care provider may recommend screening at an earlier age if you have risk factors for colon cancer.  Your health care provider may recommend using home test kits to check for hidden blood in the stool.  A small camera at the end of a tube can be used to examine your colon (sigmoidoscopy or colonoscopy). This checks for the earliest forms of colorectal cancer.  Prostate and Testicular Cancer  Depending on your age and overall health, your health care provider may do certain tests to screen for prostate and testicular cancer.  Talk to your health care provider about any symptoms or concerns you  have about testicular or prostate cancer.  Skin Cancer  Check your skin from head to toe regularly.  Tell your health care provider about any new moles or changes in moles, especially if: ? There is a change in a mole's size, shape, or color. ? You have a mole that is larger than a pencil eraser.  Always use sunscreen. Apply sunscreen liberally and repeat throughout the day.  Protect yourself by wearing long sleeves,  pants, a wide-brimmed hat, and sunglasses when outside.  What should I know about heart disease, diabetes, and high blood pressure?  If you are 71-64 years of age, have your blood pressure checked every 3-5 years. If you are 9 years of age or older, have your blood pressure checked every year. You should have your blood pressure measured twice-once when you are at a hospital or clinic, and once when you are not at a hospital or clinic. Record the average of the two measurements. To check your blood pressure when you are not at a hospital or clinic, you can use: ? An automated blood pressure machine at a pharmacy. ? A home blood pressure monitor.  Talk to your health care provider about your target blood pressure.  If you are between 12-72 years old, ask your health care provider if you should take aspirin to prevent heart disease.  Have regular diabetes screenings by checking your fasting blood sugar level. ? If you are at a normal weight and have a low risk for diabetes, have this test once every three years after the age of 37. ? If you are overweight and have a high risk for diabetes, consider being tested at a younger age or more often.  A one-time screening for abdominal aortic aneurysm (AAA) by ultrasound is recommended for men aged 28-75 years who are current or former smokers. What should I know about preventing infection? Hepatitis B If you have a higher risk for hepatitis B, you should be screened for this virus. Talk with your health care provider to find out if you are at risk for hepatitis B infection. Hepatitis C Blood testing is recommended for:  Everyone born from [birth year] through 1963.  Anyone with known risk factors for hepatitis C.  Sexually Transmitted Diseases (STDs)  You should be screened each year for STDs including gonorrhea and chlamydia if: ? You are sexually active and are younger than 49 years of age. ? You are older than 49 years of age and your health care  provider tells you that you are at risk for this type of infection. ? Your sexual activity has changed since you were last screened and you are at an increased risk for chlamydia or gonorrhea. Ask your health care provider if you are at risk.  Talk with your health care provider about whether you are at high risk of being infected with HIV. Your health care provider may recommend a prescription medicine to help prevent HIV infection.  What else can I do?  Schedule regular health, dental, and eye exams.  Stay current with your vaccines (immunizations).  Do not use any tobacco products, such as cigarettes, chewing tobacco, and e-cigarettes. If you need help quitting, ask your health care provider.  Limit alcohol intake to no more than 2 drinks per day. One drink equals 12 ounces of beer, 5 ounces of wine, or 1 ounces of hard liquor.  Do not use street drugs.  Do not share needles.  Ask your health care provider for help if  you need support or information about quitting drugs.  Tell your health care provider if you often feel depressed.  Tell your health care provider if you have ever been abused or do not feel safe at home. This information is not intended to replace advice given to you by your health care provider. Make sure you discuss any questions you have with your health care provider. Document Released: 06/10/2008 Document Revised: 08/11/2016 Document Reviewed: 09/16/2015 Elsevier Interactive Patient Education  [employer].

## 2017-09-23 NOTE — Assessment & Plan Note (Signed)
Evaluated by surgeon; discussed importance of weight loss

## 2017-09-23 NOTE — Assessment & Plan Note (Signed)
Encouraged weight loss; he has seen surgeon

## 2017-09-23 NOTE — Assessment & Plan Note (Addendum)
A little above target; patient will work on weight loss to get to goal; he did not want to increase medicine at this time; I did share with him (see AVS) the maximum dose of amlodipine so he can increase from 5 mg to 10 mg daily if needed to get BP under 130/80; if he needs this dose, I asked him to call me so we can get new Rx for him

## 2017-09-24 LAB — COMPLETE METABOLIC PANEL WITH GFR
AG Ratio: 1.8 (calc) (ref 1.0–2.5)
ALT: 23 U/L (ref 9–46)
AST: 27 U/L (ref 10–40)
Albumin: 4.4 g/dL (ref 3.6–5.1)
Alkaline phosphatase (APISO): 66 U/L (ref 40–115)
BILIRUBIN TOTAL: 0.9 mg/dL (ref 0.2–1.2)
BUN: 14 mg/dL (ref 7–25)
CHLORIDE: 101 mmol/L (ref 98–110)
CO2: 28 mmol/L (ref 20–32)
CREATININE: 1.06 mg/dL (ref 0.60–1.35)
Calcium: 9.7 mg/dL (ref 8.6–10.3)
GFR, Est African American: 95 mL/min/{1.73_m2} (ref >=60)
GFR, Est Non African American: 82 mL/min/{1.73_m2} (ref >=60)
GLUCOSE: 116 mg/dL — AB (ref 65–99)
Globulin: 2.4 g/dL (calc) (ref 1.9–3.7)
Potassium: 3.3 mmol/L — ABNORMAL LOW (ref 3.5–5.3)
SODIUM: 141 mmol/L (ref 135–146)
Total Protein: 6.8 g/dL (ref 6.1–8.1)

## 2017-09-24 LAB — CBC WITH DIFFERENTIAL/PLATELET
Basophils Absolute: 33 cells/uL (ref 0–200)
Basophils Relative: 0.4 %
EOS ABS: 91 {cells}/uL (ref 15–500)
EOS PCT: 1.1 %
HCT: 46.6 % (ref 38.5–50.0)
HEMOGLOBIN: 15.7 g/dL (ref 13.2–17.1)
Lymphs Abs: 2814 cells/uL (ref 850–3900)
MCH: 28 pg (ref 27.0–33.0)
MCHC: 33.7 g/dL (ref 32.0–36.0)
MCV: 83.1 fL (ref 80.0–100.0)
MONOS PCT: 8.2 %
MPV: 11.6 fL (ref 7.5–12.5)
NEUTROS ABS: 4681 {cells}/uL (ref 1500–7800)
Neutrophils Relative %: 56.4 %
PLATELETS: 189 10*3/uL (ref 140–400)
RBC: 5.61 10*6/uL (ref 4.20–5.80)
RDW: 14.5 % (ref 11.0–15.0)
TOTAL LYMPHOCYTE: 33.9 %
WBC mixed population: 681 cells/uL (ref 200–950)
WBC: 8.3 10*3/uL (ref 3.8–10.8)

## 2017-09-24 LAB — LIPID PANEL
CHOL/HDL RATIO: 2.8 (calc) (ref <5.0)
Cholesterol: 99 mg/dL (ref <200)
HDL: 35 mg/dL — ABNORMAL LOW (ref >40)
LDL CHOLESTEROL (CALC): 42 mg/dL
Non-HDL Cholesterol (Calc): 64 mg/dL (calc) (ref <130)
TRIGLYCERIDES: 134 mg/dL (ref <150)

## 2017-09-24 LAB — TSH: TSH: 0.54 m[IU]/L (ref 0.40–4.50)

## 2017-09-29 ENCOUNTER — Other Ambulatory Visit: Payer: Self-pay

## 2017-09-29 ENCOUNTER — Other Ambulatory Visit: Payer: Self-pay | Admitting: Family Medicine

## 2017-09-29 DIAGNOSIS — Z1211 Encounter for screening for malignant neoplasm of colon: Secondary | ICD-10-CM

## 2017-09-30 ENCOUNTER — Encounter (INDEPENDENT_AMBULATORY_CARE_PROVIDER_SITE_OTHER): Payer: BLUE CROSS/BLUE SHIELD | Admitting: Ophthalmology

## 2017-09-30 DIAGNOSIS — I10 Essential (primary) hypertension: Secondary | ICD-10-CM | POA: Diagnosis not present

## 2017-09-30 DIAGNOSIS — H2512 Age-related nuclear cataract, left eye: Secondary | ICD-10-CM | POA: Diagnosis not present

## 2017-09-30 DIAGNOSIS — E11311 Type 2 diabetes mellitus with unspecified diabetic retinopathy with macular edema: Secondary | ICD-10-CM

## 2017-09-30 DIAGNOSIS — H43813 Vitreous degeneration, bilateral: Secondary | ICD-10-CM

## 2017-09-30 DIAGNOSIS — E113313 Type 2 diabetes mellitus with moderate nonproliferative diabetic retinopathy with macular edema, bilateral: Secondary | ICD-10-CM

## 2017-09-30 DIAGNOSIS — H35033 Hypertensive retinopathy, bilateral: Secondary | ICD-10-CM | POA: Diagnosis not present

## 2017-10-04 ENCOUNTER — Telehealth: Payer: Self-pay | Admitting: Family Medicine

## 2017-10-04 MED ORDER — SULFACETAMIDE SODIUM 10 % OP SOLN: 2.0000 [drp] | OPHTHALMIC | 0 refills | Status: DC

## 2017-10-04 NOTE — Telephone Encounter (Signed)
[facility] and that's where it was sent on 09/23/17  Disp Refills Start End   neomycin-polymyxin-gramicidin (NEOSPORIN) 1.75-10000-.025 ophthalmic solution 10 mL 0 09/23/2017    Sig - Route: Place 1 drop into the right eye 4 (four) times daily. - Right Eye   Sent to pharmacy as: neomycin-polymyxin-gramicidin (NEOSPORIN) 1.75-10000-.025 ophthalmic solution   Notes to Pharmacy: Leave on file until he calls for it   E-Prescribing Status: Receipt confirmed by pharmacy (09/23/2017 3:26 PM EDT)     [facility] regardless of which one he decides to go to  I called patient, explained that I did send the Rx as I promised on Sept 28th, he just has to ask them to fill it; it's on file He [facility] I will call personally to confirm it's there; spoke w/staff; the Rx is there but the medicine is on back order; they couldn't get it until tomorrow I called in a different Rx for him since he is wanting something now They'll get it ready for him for tonight

## 2017-10-04 NOTE — Telephone Encounter (Signed)
Requesting return call to discuss a drop for his eye. State that it was discussed at his last visit and it is not at the pharmacy. States he was told that if his eye did not get better that she would call something in and in fact it is not getting any better. [facility]

## 2017-10-28 ENCOUNTER — Encounter (INDEPENDENT_AMBULATORY_CARE_PROVIDER_SITE_OTHER): Payer: BLUE CROSS/BLUE SHIELD | Admitting: Ophthalmology

## 2017-10-28 DIAGNOSIS — H2513 Age-related nuclear cataract, bilateral: Secondary | ICD-10-CM

## 2017-10-28 DIAGNOSIS — E113313 Type 2 diabetes mellitus with moderate nonproliferative diabetic retinopathy with macular edema, bilateral: Secondary | ICD-10-CM

## 2017-10-28 DIAGNOSIS — H43813 Vitreous degeneration, bilateral: Secondary | ICD-10-CM

## 2017-10-28 DIAGNOSIS — E11311 Type 2 diabetes mellitus with unspecified diabetic retinopathy with macular edema: Secondary | ICD-10-CM

## 2017-10-28 DIAGNOSIS — H35033 Hypertensive retinopathy, bilateral: Secondary | ICD-10-CM

## 2017-10-28 DIAGNOSIS — I10 Essential (primary) hypertension: Secondary | ICD-10-CM

## 2017-11-25 ENCOUNTER — Encounter (INDEPENDENT_AMBULATORY_CARE_PROVIDER_SITE_OTHER): Payer: BLUE CROSS/BLUE SHIELD | Admitting: Ophthalmology

## 2017-11-25 DIAGNOSIS — H35033 Hypertensive retinopathy, bilateral: Secondary | ICD-10-CM | POA: Diagnosis not present

## 2017-11-25 DIAGNOSIS — I10 Essential (primary) hypertension: Secondary | ICD-10-CM | POA: Diagnosis not present

## 2017-11-25 DIAGNOSIS — H43813 Vitreous degeneration, bilateral: Secondary | ICD-10-CM | POA: Diagnosis not present

## 2017-11-25 DIAGNOSIS — E113313 Type 2 diabetes mellitus with moderate nonproliferative diabetic retinopathy with macular edema, bilateral: Secondary | ICD-10-CM

## 2017-11-25 DIAGNOSIS — E11311 Type 2 diabetes mellitus with unspecified diabetic retinopathy with macular edema: Secondary | ICD-10-CM | POA: Diagnosis not present

## 2017-11-28 ENCOUNTER — Other Ambulatory Visit: Payer: Self-pay | Admitting: Family Medicine

## 2017-11-28 NOTE — Telephone Encounter (Signed)
Please verify with patient if he was taking 40 mg total or 80 mg total at the time of his cholesterol draw I'll refill the amount he was taking, so back to me after clarification Also, please see last lab results; he was supposed to return a few weeks later for some additional labs; please order per my note and ask him to come in the next few days Thank you

## 2017-12-07 ENCOUNTER — Other Ambulatory Visit: Payer: Self-pay

## 2017-12-11 IMAGING — US US SOFT TISSUE HEAD/NECK
1 series · 14 of 18 positions shown · non-contrast
Comparison: None.

CLINICAL DATA: Palpable lump about the posterior aspect the left
occiput.

EXAM:
ULTRASOUND OF HEAD/NECK SOFT TISSUES
TECHNIQUE: Ultrasound examination of the head and neck soft tissues was
performed in the area of clinical concern.

[Series 1: us soft tissue head/neck · 0.07mm/px · 18 acquisitions, 14 frames shown]
[im 1/18]
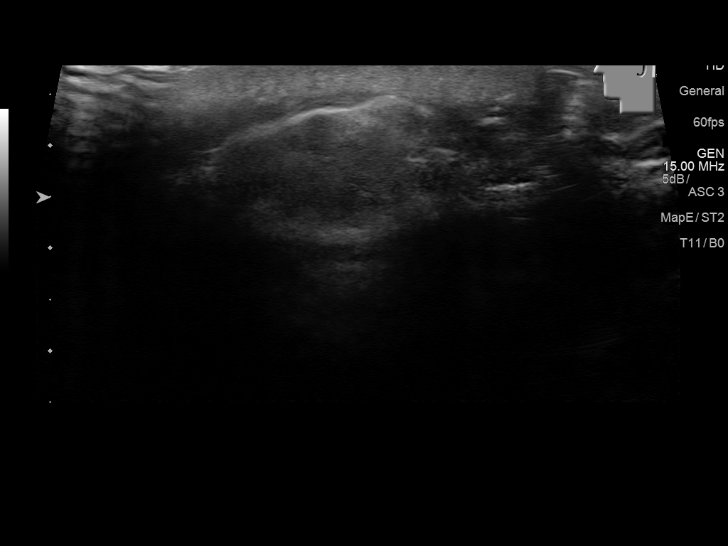
[im 2/18]
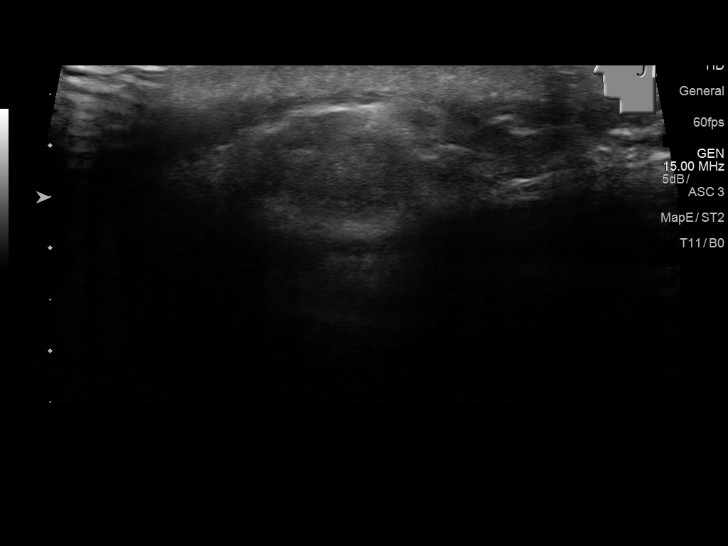
[im 4/18]
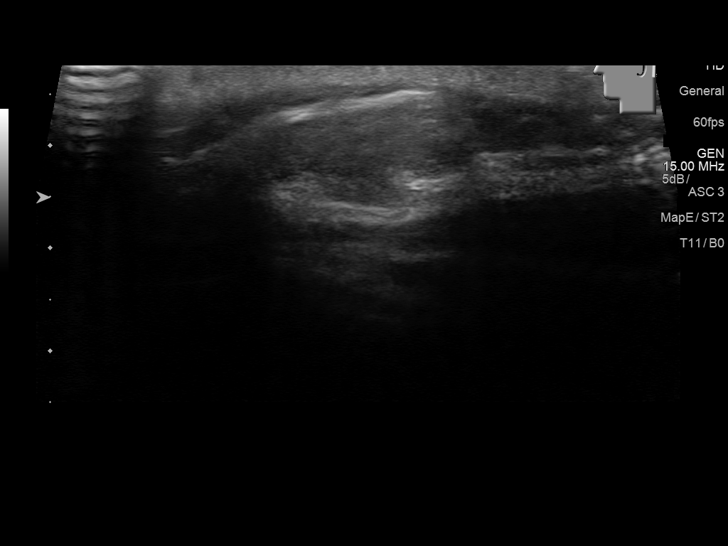
[im 5/18]
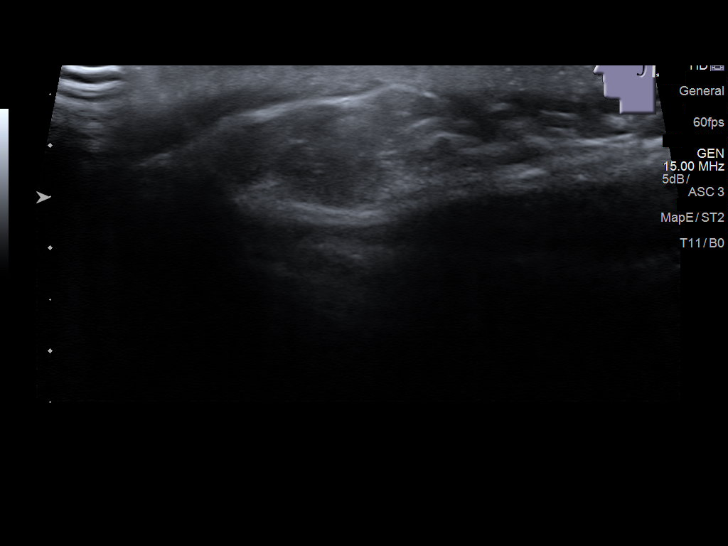
[im 6/18]
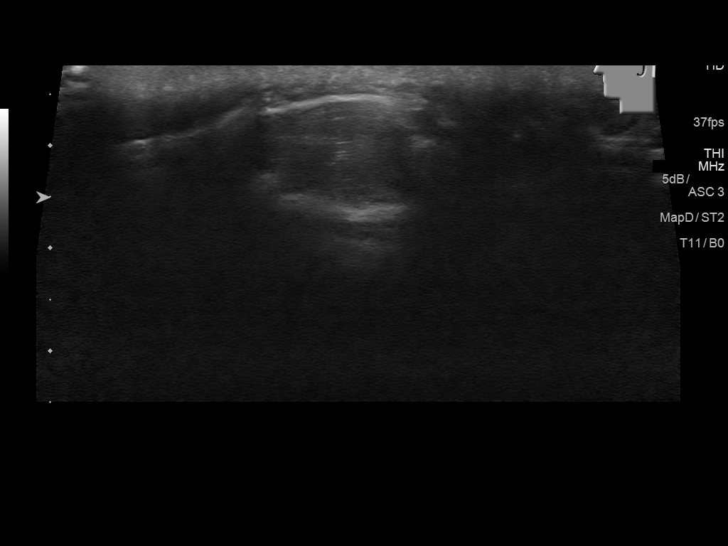
[im 8/18]
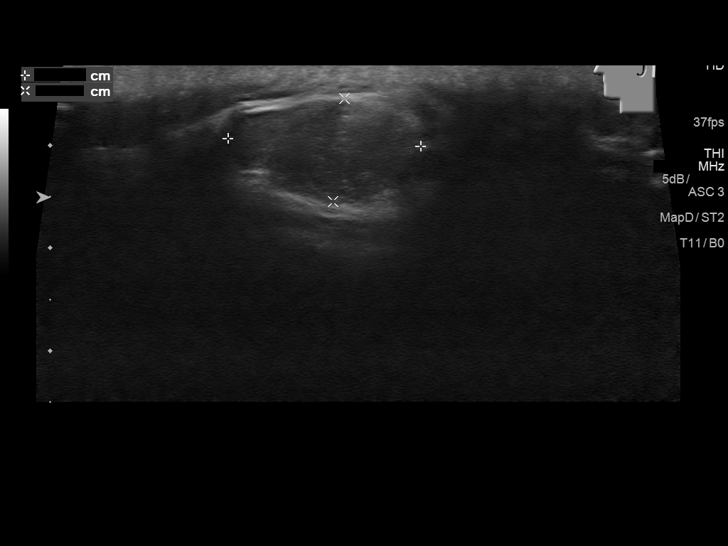
[im 9/18]
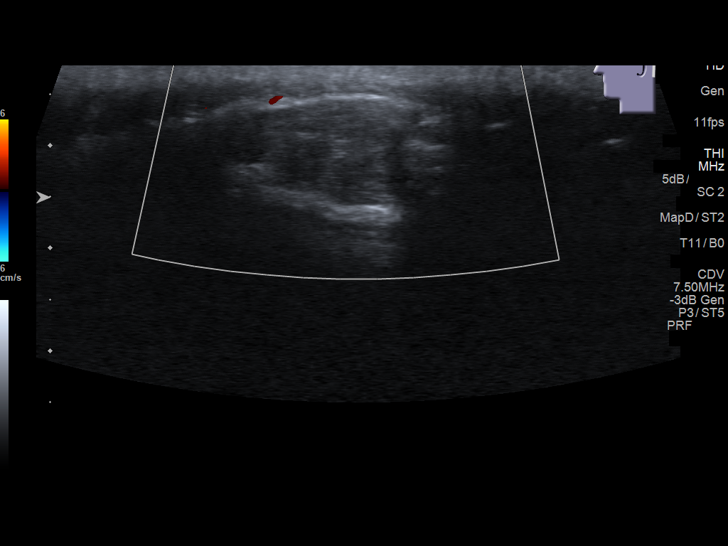
[im 10/18]
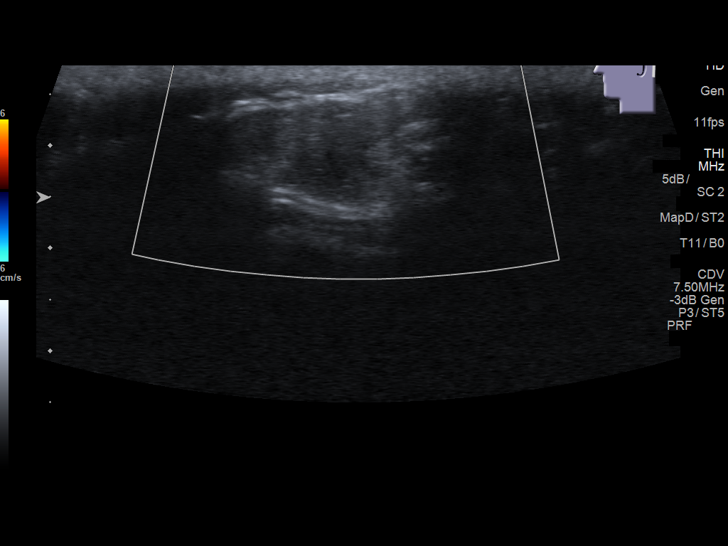
[im 11/18]
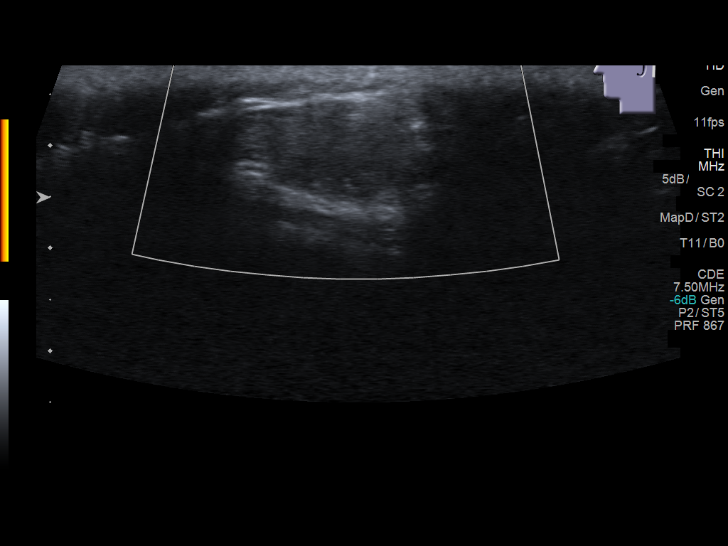
[im 13/18]
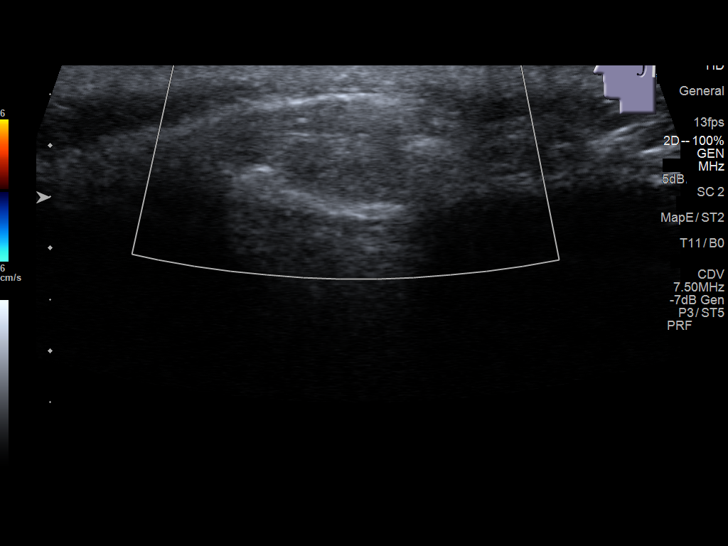
[im 14/18]
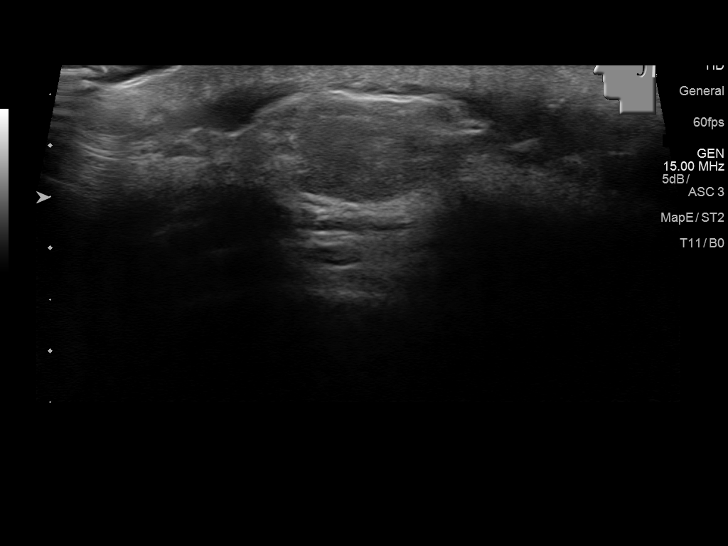
[im 15/18]
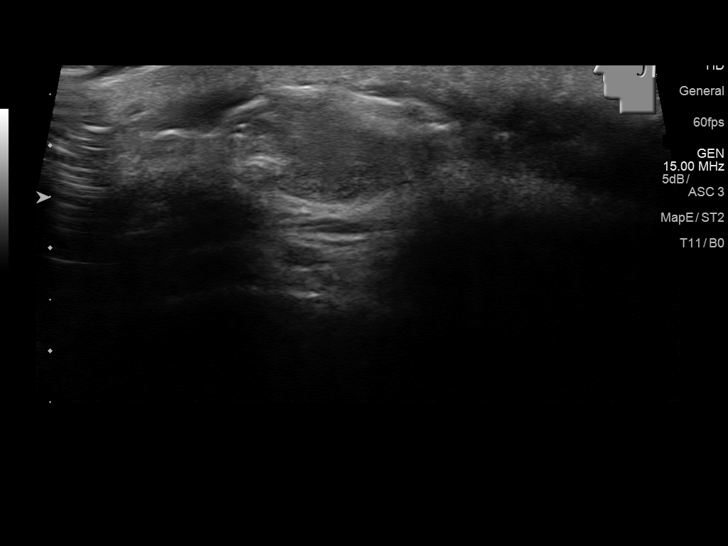
[im 17/18]
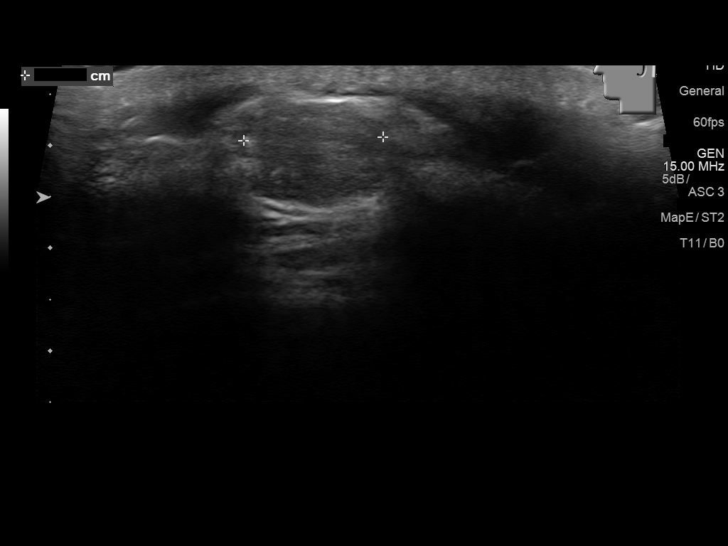
[im 18/18]
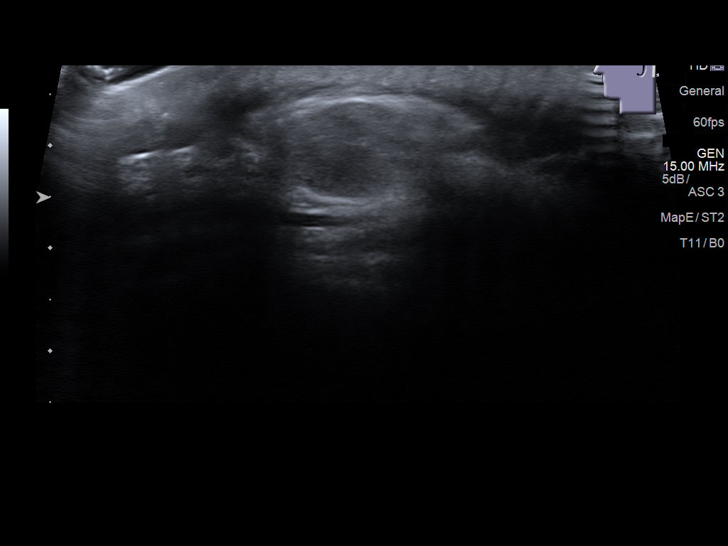

[14 of 18 positions shown; findings below may reference images not displayed]

FINDINGS: There is a approximately 1.9 x 1.0 x 1.4 cm slightly ill-defined
mixed echogenic structure within the subcutaneous tissues which
correlates with the patient's palpable area of concern.

This structure demonstrates internal echogenicity similar to the
surrounding fat. There is no definitive communication with the
overlying dermal surface. No significant internal blood flow (image
12).
IMPRESSION: Approximately 1.9 cm slightly ill-defined mixed echogenic structure
within the subcutaneous tissues correlates with the patient's
palpable area of concern, posterior to the left ear - while
indeterminate on this examination, this structure demonstrates
internal echogenicity similar to the surrounding fat and thus is
favored to represent a lipoma.

## 2017-12-29 ENCOUNTER — Other Ambulatory Visit: Payer: Self-pay | Admitting: Family Medicine

## 2017-12-29 NOTE — Telephone Encounter (Signed)
Last K+ at endo was normal

## 2017-12-30 ENCOUNTER — Encounter (INDEPENDENT_AMBULATORY_CARE_PROVIDER_SITE_OTHER): Payer: BLUE CROSS/BLUE SHIELD | Admitting: Ophthalmology

## 2018-01-06 ENCOUNTER — Encounter: Payer: Self-pay | Admitting: *Deleted

## 2018-01-06 ENCOUNTER — Encounter (INDEPENDENT_AMBULATORY_CARE_PROVIDER_SITE_OTHER): Payer: BLUE CROSS/BLUE SHIELD | Admitting: Ophthalmology

## 2018-01-06 DIAGNOSIS — E113313 Type 2 diabetes mellitus with moderate nonproliferative diabetic retinopathy with macular edema, bilateral: Secondary | ICD-10-CM

## 2018-01-06 DIAGNOSIS — H2513 Age-related nuclear cataract, bilateral: Secondary | ICD-10-CM | POA: Diagnosis not present

## 2018-01-06 DIAGNOSIS — E11311 Type 2 diabetes mellitus with unspecified diabetic retinopathy with macular edema: Secondary | ICD-10-CM | POA: Diagnosis not present

## 2018-01-06 DIAGNOSIS — I10 Essential (primary) hypertension: Secondary | ICD-10-CM

## 2018-01-06 DIAGNOSIS — H35033 Hypertensive retinopathy, bilateral: Secondary | ICD-10-CM | POA: Diagnosis not present

## 2018-01-06 DIAGNOSIS — H43813 Vitreous degeneration, bilateral: Secondary | ICD-10-CM

## 2018-01-21 ENCOUNTER — Other Ambulatory Visit: Payer: Self-pay | Admitting: Family Medicine

## 2018-02-10 ENCOUNTER — Encounter (INDEPENDENT_AMBULATORY_CARE_PROVIDER_SITE_OTHER): Payer: BLUE CROSS/BLUE SHIELD | Admitting: Ophthalmology

## 2018-02-10 DIAGNOSIS — H35033 Hypertensive retinopathy, bilateral: Secondary | ICD-10-CM

## 2018-02-10 DIAGNOSIS — H43813 Vitreous degeneration, bilateral: Secondary | ICD-10-CM

## 2018-02-10 DIAGNOSIS — E11311 Type 2 diabetes mellitus with unspecified diabetic retinopathy with macular edema: Secondary | ICD-10-CM

## 2018-02-10 DIAGNOSIS — I10 Essential (primary) hypertension: Secondary | ICD-10-CM

## 2018-02-10 DIAGNOSIS — E113313 Type 2 diabetes mellitus with moderate nonproliferative diabetic retinopathy with macular edema, bilateral: Secondary | ICD-10-CM | POA: Diagnosis not present

## 2018-02-10 DIAGNOSIS — H2513 Age-related nuclear cataract, bilateral: Secondary | ICD-10-CM

## 2018-02-13 LAB — MICROALBUMIN, URINE: Microalb, Ur: 15

## 2018-02-13 LAB — HEMOGLOBIN A1C: HEMOGLOBIN A1C: 8.9

## 2018-02-13 LAB — BASIC METABOLIC PANEL: GLUCOSE: 199

## 2018-02-28 ENCOUNTER — Telehealth: Payer: Self-pay | Admitting: Gastroenterology

## 2018-02-28 MED ORDER — ATORVASTATIN CALCIUM 40 MG PO TABS
40.0000 mg | ORAL_TABLET | Freq: Every day | ORAL | 1 refills | Status: DC
Start: 2018-02-28 — End: 2018-04-04

## 2018-02-28 NOTE — Telephone Encounter (Signed)
Patient called and is ready to schedule his colonoscopy.  

## 2018-02-28 NOTE — Telephone Encounter (Signed)
Rx for 40 mg sent ----------------------------------------- Please see the comments about the last lab results, need for blood work to be ordered (in the note earlier and on the 09/23/17 lab discussion) Thank you

## 2018-02-28 NOTE — Telephone Encounter (Signed)
He was taking 40mg 

## 2018-03-01 ENCOUNTER — Other Ambulatory Visit: Payer: Self-pay

## 2018-03-01 DIAGNOSIS — E876 Hypokalemia: Secondary | ICD-10-CM

## 2018-03-01 NOTE — Telephone Encounter (Signed)
Labs ordered and left voicemail

## 2018-03-02 ENCOUNTER — Telehealth: Payer: Self-pay

## 2018-03-03 ENCOUNTER — Other Ambulatory Visit: Payer: Self-pay

## 2018-03-03 DIAGNOSIS — Z1211 Encounter for screening for malignant neoplasm of colon: Secondary | ICD-10-CM

## 2018-03-03 NOTE — Telephone Encounter (Signed)
Gastroenterology Pre-Procedure Review  Request Date: 03/17/18 Requesting Physician: Dr. Bonna Gains  PATIENT REVIEW QUESTIONS: The patient responded to the following health history questions as indicated:    1. Are you having any GI issues?No 2. Do you have a personal history of Polyps? no 3. Do you have a family history of Colon Cancer or Polyps? no 4. Diabetes Mellitus? yes (type 2) 5. Joint replacements in the past 12 months?no 6. Major health problems in the past 3 months?no 7. Any artificial heart valves, MVP, or defibrillator?no    MEDICATIONS & ALLERGIES:    Patient reports the following regarding taking any anticoagulation/antiplatelet therapy:   Plavix, Coumadin, Eliquis, Xarelto, Lovenox, Pradaxa, Brilinta, or Effient? no Aspirin? no  Patient confirms/reports the following medications:  Current Outpatient Medications  Medication Sig Dispense Refill  . amLODipine (NORVASC) 5 MG tablet TAKE 1 TABLET (5 MG TOTAL) BY MOUTH DAILY. (FOR BLOOD PRESSURE) 90 tablet 3  . aspirin EC 81 MG tablet Take 1 tablet (81 mg total) by mouth daily. To help prevent heart attack 90 tablet 1  . atorvastatin (LIPITOR) 40 MG tablet Take 1 tablet (40 mg total) by mouth at bedtime. 30 tablet 1  . B-D UF III MINI PEN NEEDLES 31G X 5 MM MISC FOR USE WITH INJECTABLE MEDICINE, ONCE A DAY 90 each 3  . BESIVANCE 0.6 % SUSP PLACE 1 DROP INTO RIGHT EYE 4 TIMES A DAY FOR 2 DAYS AFTER EACH MONTHLY EYE INJECTION  12  . glipiZIDE (GLUCOTROL XL) 10 MG 24 hr tablet Take 1 tablet by mouth daily.    Marland Kitchen glipiZIDE-metformin (METAGLIP) 2.5-500 MG tablet Take 2 tablets by mouth 2 (two) times daily.  1  . glucose blood (ONE TOUCH ULTRA TEST) test strip Use as instructed 300 each 3  . hydrochlorothiazide (HYDRODIURIL) 25 MG tablet TAKE 1 TABLET BY MOUTH EVERY DAY 90 tablet 0  . Liraglutide (VICTOZA) 18 MG/3ML SOPN Inject 0.3 mLs (1.8 mg total) into the skin daily. 15 pen 1  . lisinopril (PRINIVIL,ZESTRIL) 40 MG tablet TAKE 1  TABLET (40 MG TOTAL) BY MOUTH DAILY. (FOR KIDNEY PROTECTION AND BLOOD PRESSURE) 90 tablet 1  . Multiple Vitamin (MULTIVITAMIN) tablet Take 1 tablet by mouth daily.    Glory Rosebush DELICA LANCETS FINE MISC 1 Device by Does not apply route 3 (three) times daily. 300 each 3  . OZEMPIC 0.25 or 0.5 MG/DOSE SOPN     . prednisoLONE acetate (PRED FORTE) 1 % ophthalmic suspension INSTILL 1 DROP IN THE RIGHT EYE 3 TIMES A DAY FOR 5 DAYS THEN DISCONTINUE  6  . Semaglutide (OZEMPIC) 0.25 or 0.5 MG/DOSE SOPN Inject into the skin.    Marland Kitchen sulfacetamide (BLEPH-10) 10 % ophthalmic solution Place 2 drops into the right eye every 3 (three) hours while awake. 15 mL 0  . XIGDUO XR 04-999 MG TB24 Take 2 tablets by mouth daily.  11   No current facility-administered medications for this visit.     Patient confirms/reports the following allergies:  No Known Allergies  No orders of the defined types were placed in this encounter.   AUTHORIZATION INFORMATION Primary Insurance: 1D#: Group #:  Secondary Insurance: 1D#: Group #:  SCHEDULE INFORMATION: Date: 03/17/18 Time: Location:[facility]

## 2018-03-09 ENCOUNTER — Telehealth: Payer: Self-pay

## 2018-03-09 NOTE — Telephone Encounter (Signed)
Patient has been asked to reschedule his screening colonoscopy until after his birthday because insurance may not cover.  He agreed with moving his colonoscopy to after his birthday.  His new date is May 17th.  Wannetta Sender has been asked to change the date. Instructions will be sent out to reflect new date.  Thanks [employer]

## 2018-03-10 ENCOUNTER — Encounter (INDEPENDENT_AMBULATORY_CARE_PROVIDER_SITE_OTHER): Payer: BLUE CROSS/BLUE SHIELD | Admitting: Ophthalmology

## 2018-03-10 DIAGNOSIS — E113313 Type 2 diabetes mellitus with moderate nonproliferative diabetic retinopathy with macular edema, bilateral: Secondary | ICD-10-CM

## 2018-03-10 DIAGNOSIS — H43813 Vitreous degeneration, bilateral: Secondary | ICD-10-CM

## 2018-03-10 DIAGNOSIS — I10 Essential (primary) hypertension: Secondary | ICD-10-CM

## 2018-03-10 DIAGNOSIS — H35033 Hypertensive retinopathy, bilateral: Secondary | ICD-10-CM | POA: Diagnosis not present

## 2018-03-10 DIAGNOSIS — E11311 Type 2 diabetes mellitus with unspecified diabetic retinopathy with macular edema: Secondary | ICD-10-CM

## 2018-03-17 ENCOUNTER — Telehealth: Payer: Self-pay | Admitting: Family Medicine

## 2018-03-17 ENCOUNTER — Ambulatory Visit: Admit: 2018-03-17 | Payer: BLUE CROSS/BLUE SHIELD | Admitting: Gastroenterology

## 2018-03-17 NOTE — Telephone Encounter (Signed)
Can we please change this to a 90 day supply per pt's insurance. Thanks

## 2018-03-17 NOTE — Telephone Encounter (Signed)
We're not prescribing the Ozempic Please ask them to call his endocrinologist

## 2018-03-17 NOTE — Telephone Encounter (Signed)
Copied from [place]. Topic: Quick Communication - See Telephone Encounter >> Mar 17, 2018  1:23 PM Burnis Medin, NT wrote: CRM for notification. See Telephone encounter for: 03/17/18. Patient wife called and said that OZEMPIC 0.25 or 0.5 MG/DOSE SOPN has to be written for a 90 day supply in order for insurance. Pls [facility] to [id number] [place], [place] - [address] [phone] (Phone) [phone] (Fax)

## 2018-03-20 NOTE — Telephone Encounter (Signed)
Called pharmacy LM informing them that refill request needs to go to the office of Dr.Solum.

## 2018-03-29 ENCOUNTER — Other Ambulatory Visit: Payer: Self-pay | Admitting: Family Medicine

## 2018-03-29 NOTE — Telephone Encounter (Signed)
Called pt informed him of the need for an appt, offered to get pt scheduled he states that he will need to call us back to schedule appt. Labs abstracted.

## 2018-03-29 NOTE — Telephone Encounter (Signed)
Patient needs an appt please He was due for labs in October He has not been seen for over 6 months Please ask him to come in within the next month Please abstract his last A1c and urine microalbumin:Cr

## 2018-03-31 ENCOUNTER — Telehealth: Payer: Self-pay | Admitting: Family Medicine

## 2018-03-31 ENCOUNTER — Other Ambulatory Visit: Payer: Self-pay

## 2018-03-31 NOTE — Telephone Encounter (Signed)
Called pt and LVM for pt to call the office to schedule an appt for med refill.

## 2018-03-31 NOTE — Telephone Encounter (Signed)
Pt requests 90 day supply

## 2018-03-31 NOTE — Telephone Encounter (Signed)
I prescribed a one year supply of amlodipine on 09/29/17 Please resolve with pharmacy

## 2018-04-03 ENCOUNTER — Telehealth: Payer: Self-pay | Admitting: Family Medicine

## 2018-04-03 NOTE — Telephone Encounter (Unsigned)
Copied from [place] [id number]. Topic: Quick Communication - Rx Refill/Question >> Apr 03, 2018  5:02 PM [place], [place] A wrote: Medication:  glipiZIDE (GLUCOTROL XL) 10 MG 24 hr tablet [[id number]] -2 tabs once daily  atorvastatin (LIPITOR) 40 MG tablet [[id number]] hydrochlorothiazide (HYDRODIURIL) 25 MG tablet [[id number]]  ALL 90 DAY SUPPLY PER INS Has the patient contacted their pharmacy? YES } (Agent: If no, request that the patient contact the pharmacy for the refill.) Preferred Pharmacy (with phone number or street name[facility] [place]  Agent: Please be advised that RX refills may take up to 3 business days. We ask that you follow-up with your pharmacy.

## 2018-04-04 ENCOUNTER — Other Ambulatory Visit: Payer: Self-pay

## 2018-04-04 MED ORDER — GLIPIZIDE ER 10 MG PO TB24: 10.0000 mg | ORAL_TABLET | Freq: Every day | ORAL | 0 refills | Status: DC

## 2018-04-04 MED ORDER — ATORVASTATIN CALCIUM 40 MG PO TABS: 40.0000 mg | ORAL_TABLET | Freq: Every day | ORAL | 0 refills | Status: DC

## 2018-04-04 MED ORDER — HYDROCHLOROTHIAZIDE 25 MG PO TABS: 25.0000 mg | ORAL_TABLET | Freq: Every day | ORAL | 0 refills | Status: DC

## 2018-04-04 NOTE — Telephone Encounter (Signed)
Previous note about needing labs and appt I see he is now scheduled in May Will allow one more refill

## 2018-04-04 NOTE — Telephone Encounter (Signed)
Pt needs 90 day supplys per insurance purposes.

## 2018-04-07 ENCOUNTER — Encounter (INDEPENDENT_AMBULATORY_CARE_PROVIDER_SITE_OTHER): Payer: BLUE CROSS/BLUE SHIELD | Admitting: Ophthalmology

## 2018-04-07 DIAGNOSIS — E113313 Type 2 diabetes mellitus with moderate nonproliferative diabetic retinopathy with macular edema, bilateral: Secondary | ICD-10-CM | POA: Diagnosis not present

## 2018-04-07 DIAGNOSIS — I10 Essential (primary) hypertension: Secondary | ICD-10-CM | POA: Diagnosis not present

## 2018-04-07 DIAGNOSIS — H35033 Hypertensive retinopathy, bilateral: Secondary | ICD-10-CM | POA: Diagnosis not present

## 2018-04-07 DIAGNOSIS — H43813 Vitreous degeneration, bilateral: Secondary | ICD-10-CM | POA: Diagnosis not present

## 2018-04-07 DIAGNOSIS — H2513 Age-related nuclear cataract, bilateral: Secondary | ICD-10-CM

## 2018-04-07 DIAGNOSIS — E11311 Type 2 diabetes mellitus with unspecified diabetic retinopathy with macular edema: Secondary | ICD-10-CM

## 2018-05-05 ENCOUNTER — Encounter (INDEPENDENT_AMBULATORY_CARE_PROVIDER_SITE_OTHER): Payer: BLUE CROSS/BLUE SHIELD | Admitting: Ophthalmology

## 2018-05-05 DIAGNOSIS — E113313 Type 2 diabetes mellitus with moderate nonproliferative diabetic retinopathy with macular edema, bilateral: Secondary | ICD-10-CM | POA: Diagnosis not present

## 2018-05-05 DIAGNOSIS — I10 Essential (primary) hypertension: Secondary | ICD-10-CM | POA: Diagnosis not present

## 2018-05-05 DIAGNOSIS — E11311 Type 2 diabetes mellitus with unspecified diabetic retinopathy with macular edema: Secondary | ICD-10-CM

## 2018-05-05 DIAGNOSIS — H43813 Vitreous degeneration, bilateral: Secondary | ICD-10-CM

## 2018-05-05 DIAGNOSIS — H35033 Hypertensive retinopathy, bilateral: Secondary | ICD-10-CM

## 2018-05-11 ENCOUNTER — Other Ambulatory Visit: Payer: Self-pay

## 2018-05-11 DIAGNOSIS — Z1211 Encounter for screening for malignant neoplasm of colon: Secondary | ICD-10-CM

## 2018-05-12 ENCOUNTER — Ambulatory Visit: Payer: BLUE CROSS/BLUE SHIELD | Admitting: Family Medicine

## 2018-05-12 ENCOUNTER — Ambulatory Visit
Admission: RE | Admit: 2018-05-12 | Payer: BLUE CROSS/BLUE SHIELD | Source: Ambulatory Visit | Admitting: Gastroenterology

## 2018-05-12 ENCOUNTER — Encounter: Admission: RE | Payer: Self-pay | Source: Ambulatory Visit

## 2018-05-12 SURGERY — COLONOSCOPY WITH PROPOFOL
Anesthesia: General

## 2018-05-27 ENCOUNTER — Other Ambulatory Visit: Payer: Self-pay | Admitting: Family Medicine

## 2018-05-29 ENCOUNTER — Other Ambulatory Visit: Payer: Self-pay

## 2018-05-29 DIAGNOSIS — Z1211 Encounter for screening for malignant neoplasm of colon: Secondary | ICD-10-CM

## 2018-05-30 ENCOUNTER — Other Ambulatory Visit: Payer: Self-pay

## 2018-05-30 ENCOUNTER — Telehealth: Payer: Self-pay | Admitting: Gastroenterology

## 2018-05-30 MED ORDER — NA SULFATE-K SULFATE-MG SULF 17.5-3.13-1.6 GM/177ML PO SOLN: 1.0000 | Freq: Once | ORAL | 0 refills | Status: AC

## 2018-05-30 NOTE — Telephone Encounter (Signed)
Notified patient bowel prep has been faxed to pharmacy.

## 2018-05-30 NOTE — Telephone Encounter (Signed)
Patient should be getting all diabetes medicines and supplies from the endocrinologist; thank you

## 2018-05-30 NOTE — Telephone Encounter (Signed)
Pt.notified

## 2018-05-30 NOTE — Telephone Encounter (Signed)
Patient LVM that he has not received his Rx for bowel prep. [facility] [employer] [facility]

## 2018-06-02 ENCOUNTER — Ambulatory Visit: Payer: BLUE CROSS/BLUE SHIELD | Admitting: Anesthesiology

## 2018-06-02 ENCOUNTER — Encounter: Payer: Self-pay | Admitting: Anesthesiology

## 2018-06-02 ENCOUNTER — Ambulatory Visit
Admission: RE | Admit: 2018-06-02 | Discharge: 2018-06-02 | Disposition: A | Discharge status: Home / Self Care | Payer: BLUE CROSS/BLUE SHIELD | Source: Ambulatory Visit | Attending: Gastroenterology | Admitting: Gastroenterology

## 2018-06-02 ENCOUNTER — Encounter
Admission: RE | Disposition: A | Discharge status: Home / Self Care | Payer: Self-pay | Source: Ambulatory Visit | Attending: Gastroenterology

## 2018-06-02 DIAGNOSIS — E119 Type 2 diabetes mellitus without complications: Secondary | ICD-10-CM | POA: Insufficient documentation

## 2018-06-02 DIAGNOSIS — Z1211 Encounter for screening for malignant neoplasm of colon: Secondary | ICD-10-CM | POA: Diagnosis not present

## 2018-06-02 DIAGNOSIS — Z79899 Other long term (current) drug therapy: Secondary | ICD-10-CM | POA: Insufficient documentation

## 2018-06-02 DIAGNOSIS — E785 Hyperlipidemia, unspecified: Secondary | ICD-10-CM | POA: Diagnosis not present

## 2018-06-02 DIAGNOSIS — K635 Polyp of colon: Secondary | ICD-10-CM | POA: Insufficient documentation

## 2018-06-02 DIAGNOSIS — Z7984 Long term (current) use of oral hypoglycemic drugs: Secondary | ICD-10-CM | POA: Diagnosis not present

## 2018-06-02 DIAGNOSIS — I10 Essential (primary) hypertension: Secondary | ICD-10-CM | POA: Insufficient documentation

## 2018-06-02 DIAGNOSIS — Z7982 Long term (current) use of aspirin: Secondary | ICD-10-CM | POA: Insufficient documentation

## 2018-06-02 DIAGNOSIS — D122 Benign neoplasm of ascending colon: Secondary | ICD-10-CM | POA: Diagnosis not present

## 2018-06-02 HISTORY — PX: COLONOSCOPY WITH PROPOFOL: SHX5780

## 2018-06-02 LAB — GLUCOSE, CAPILLARY: Glucose-Capillary: 173 mg/dL — ABNORMAL HIGH (ref 65–99)

## 2018-06-02 SURGERY — COLONOSCOPY WITH PROPOFOL
Anesthesia: General

## 2018-06-02 MED ORDER — LIDOCAINE HCL (CARDIAC) PF 100 MG/5ML IV SOSY
PREFILLED_SYRINGE | INTRAVENOUS | Status: DC | PRN
  Administered 2018-06-02: 50 mg via INTRAVENOUS

## 2018-06-02 MED ORDER — PROPOFOL 500 MG/50ML IV EMUL
INTRAVENOUS | Status: AC
  Filled 2018-06-02: qty 50

## 2018-06-02 MED ORDER — PHENYLEPHRINE HCL 10 MG/ML IJ SOLN
INTRAMUSCULAR | Status: AC
  Filled 2018-06-02: qty 1

## 2018-06-02 MED ORDER — PHENYLEPHRINE HCL 10 MG/ML IJ SOLN
INTRAMUSCULAR | Status: DC | PRN
  Administered 2018-06-02 (×4): 50 ug via INTRAVENOUS

## 2018-06-02 MED ORDER — FENTANYL CITRATE (PF) 100 MCG/2ML IJ SOLN
INTRAMUSCULAR | Status: AC
  Filled 2018-06-02: qty 2

## 2018-06-02 MED ORDER — LIDOCAINE HCL (PF) 2 % IJ SOLN
INTRAMUSCULAR | Status: AC
  Filled 2018-06-02: qty 10

## 2018-06-02 MED ORDER — PROPOFOL 500 MG/50ML IV EMUL
INTRAVENOUS | Status: DC | PRN
  Administered 2018-06-02: 120 ug/kg/min via INTRAVENOUS

## 2018-06-02 MED ORDER — LIDOCAINE HCL (PF) 1 % IJ SOLN
2.0000 mL | Freq: Once | INTRAMUSCULAR | Status: AC
  Administered 2018-06-02: 0.3 mL via INTRADERMAL

## 2018-06-02 MED ORDER — LIDOCAINE HCL (PF) 1 % IJ SOLN
INTRAMUSCULAR | Status: AC
  Administered 2018-06-02: 0.3 mL via INTRADERMAL
  Filled 2018-06-02: qty 2

## 2018-06-02 MED ORDER — FENTANYL CITRATE (PF) 100 MCG/2ML IJ SOLN
INTRAMUSCULAR | Status: DC | PRN
  Administered 2018-06-02: 50 ug via INTRAVENOUS

## 2018-06-02 MED ORDER — MIDAZOLAM HCL 2 MG/2ML IJ SOLN
INTRAMUSCULAR | Status: AC
  Filled 2018-06-02: qty 2

## 2018-06-02 MED ORDER — SODIUM CHLORIDE 0.9 % IV SOLN
INTRAVENOUS | Status: DC
  Administered 2018-06-02: 1000 mL via INTRAVENOUS

## 2018-06-02 MED ORDER — MIDAZOLAM HCL 2 MG/2ML IJ SOLN
INTRAMUSCULAR | Status: DC | PRN
  Administered 2018-06-02: 2 mg via INTRAVENOUS

## 2018-06-02 NOTE — Anesthesia Post-op Follow-up Note (Signed)
Anesthesia QCDR form completed.        

## 2018-06-02 NOTE — Anesthesia Procedure Notes (Signed)
Performed by: Cook-Martin, Declyn Offield Pre-anesthesia Checklist: Patient identified, Emergency Drugs available, Suction available, Patient being monitored and Timeout performed Patient Re-evaluated:Patient Re-evaluated prior to induction Oxygen Delivery Method: Simple face mask Preoxygenation: Pre-oxygenation with 100% oxygen Induction Type: IV induction Ventilation: Oral airway inserted - appropriate to patient size Placement Confirmation: positive ETCO2 and CO2 detector       

## 2018-06-02 NOTE — Op Note (Signed)
[facility] Patient Name: Clarence Black Procedure Date: 06/02/2018 9:31 AM MRN: [id number] Account #: [id number] Date of Birth: August 31, 1966 Admit Type: Outpatient Age: 52 Room: [facility] ENDO ROOM 4 Gender: Male Note Status: Finalized Procedure:            Colonoscopy Indications:          Screening for colorectal malignant neoplasm Providers:            Jonathon Bellows MD, MD Referring MD:         Arnetha Courser (Referring MD) Medicines:            Monitored Anesthesia Care Complications:        No immediate complications. Procedure:            Pre-Anesthesia Assessment:                       - Prior to the procedure, a History and Physical was                        performed, and patient medications, allergies and                        sensitivities were reviewed. The patient's tolerance of                        previous anesthesia was reviewed.                       - The risks and benefits of the procedure and the                        sedation options and risks were discussed with the                        patient. All questions were answered and informed                        consent was obtained.                       - ASA Grade Assessment: II - A patient with mild                        systemic disease.                       After obtaining informed consent, the colonoscope was                        passed under direct vision. Throughout the procedure,                        the patient's blood pressure, pulse, and oxygen                        saturations were monitored continuously. The                        Colonoscope was introduced through the anus and  advanced to the the cecum, identified by the                        appendiceal orifice, IC valve and transillumination.                        The colonoscopy was performed with ease. The patient                        tolerated the procedure well. The quality  of the bowel                        preparation was excellent. Findings:      The perianal and digital rectal examinations were normal.      A 3 mm polyp was found in the ascending colon. The polyp was sessile.       The polyp was removed with a cold biopsy forceps. Resection and       retrieval were complete.      The exam was otherwise without abnormality on direct and retroflexion       views. Impression:           - One 3 mm polyp in the ascending colon, removed with a                        cold biopsy forceps. Resected and retrieved.                       - The examination was otherwise normal on direct and                        retroflexion views. Recommendation:       - Discharge patient to home (with escort).                       - Resume previous diet.                       - Continue present medications.                       - Await pathology results.                       - Repeat colonoscopy in 5-10 years for surveillance. Procedure Code(s):    --- Professional ---                       [phone], Colonoscopy, flexible; with biopsy, single or                        multiple Diagnosis Code(s):    --- Professional ---                       Z12.11, Encounter for screening for malignant neoplasm                        of colon                       D12.2, Benign neoplasm of ascending colon CPT copyright 2017 American Medical Association. All rights reserved. The codes  documented in this report are preliminary and upon coder review may  be revised to meet current compliance requirements. Jonathon Bellows, MD Jonathon Bellows MD, MD 06/02/2018 9:52:26 AM This report has been signed electronically. Number of Addenda: 0 Note Initiated On: 06/02/2018 9:31 AM Scope Withdrawal Time: 0 hours 10 minutes 24 seconds  Total Procedure Duration: 0 hours 14 minutes 32 seconds       [facility]

## 2018-06-02 NOTE — Anesthesia Preprocedure Evaluation (Addendum)
Anesthesia Evaluation  Patient identified by MRN, date of birth, ID band Patient awake    Reviewed: Allergy & Precautions, H&P , NPO status , Patient's Chart, lab work & pertinent test results, reviewed documented beta blocker date and time   History of Anesthesia Complications Negative for: history of anesthetic complications  Airway Mallampati: II  TM Distance: >3 FB Neck ROM: full    Dental  (+) Dental Advidsory Given   Pulmonary neg pulmonary ROS,           Cardiovascular Exercise Tolerance: Good hypertension, (-) angina(-) CAD, (-) Past MI, (-) Cardiac Stents and (-) CABG (-) dysrhythmias (-) Valvular Problems/Murmurs     Neuro/Psych PSYCHIATRIC DISORDERS Anxiety negative neurological ROS     GI/Hepatic negative GI ROS, Neg liver ROS,   Endo/Other  diabetesMorbid obesity  Renal/GU negative Renal ROS  negative genitourinary   Musculoskeletal   Abdominal   Peds  Hematology negative hematology ROS (+)   Anesthesia Other Findings Past Medical History: No date: Diabetes mellitus without complication (HCC) No date: Diabetic retinopathy ([place]) 12/31/2016: Diastasis recti No date: Erectile dysfunction 02/20/2015: Erectile dysfunction associated with type 2 diabetes  mellitus ([place]) 12/31/2016: Fear of flying 06/13/2015: Hyperlipidemia LDL goal <100 No date: Hypertension 06/13/2015: Hypertension goal BP (blood pressure) < 130/80 No date: Hypogonadism, male 02/20/2015: Obesity, Class III, BMI 40-49.9 (morbid obesity) ([place]) 10/01/2015: Type 2 diabetes mellitus ([place]) 06/13/2015: Uncontrolled type 2 diabetes mellitus with peripheral  circulatory disorder (HCC)   Reproductive/Obstetrics negative OB ROS                           Anesthesia Physical Anesthesia Plan  ASA: III  Anesthesia Plan: General   Post-op Pain Management:    Induction: Intravenous  PONV Risk Score and Plan: 2 and Propofol  infusion  Airway Management Planned: Nasal Cannula  Additional Equipment:   Intra-op Plan:   Post-operative Plan:   Informed Consent: I have reviewed the patients History and Physical, chart, labs and discussed the procedure including the risks, benefits and alternatives for the proposed anesthesia with the patient or authorized representative who has indicated his/her understanding and acceptance.   Dental Advisory Given  Plan Discussed with: Anesthesiologist, CRNA and Surgeon  Anesthesia Plan Comments:         Anesthesia Quick Evaluation

## 2018-06-02 NOTE — Transfer of Care (Signed)
Immediate Anesthesia Transfer of Care Note  Patient: Clarence Black  Procedure(s) Performed: COLONOSCOPY WITH PROPOFOL (N/A )  Patient Location: PACU  Anesthesia Type:General  Level of Consciousness: awake and sedated  Airway & Oxygen Therapy: Patient Spontanous Breathing and Patient connected to face mask oxygen  Post-op Assessment: Report given to RN and Post -op Vital signs reviewed and stable  Post vital signs: Reviewed and stable  Last Vitals:  Vitals Value Taken Time  BP 101/65 06/02/2018  9:58 AM  Temp 36.1 C 06/02/2018  9:58 AM  Pulse 93 06/02/2018 10:00 AM  Resp 18 06/02/2018 10:00 AM  SpO2 95 % 06/02/2018 10:00 AM  Vitals shown include unvalidated device data.  Last Pain:  Vitals:   06/02/18 0958  TempSrc: Tympanic  PainSc: 0-No pain         Complications: No apparent anesthesia complications

## 2018-06-02 NOTE — H&P (Signed)
Jonathon Bellows, MD [address] [place], [place], [place], [address] [address], [place], [place], [place], [address] Phone: [phone]  Fax: [phone]  Primary Care Physician:  Arnetha Courser, MD   Pre-Procedure History & Physical: HPI:  Clarence Black is a 52 y.o. male is here for an colonoscopy.   Past Medical History:  Diagnosis Date  . Diabetes mellitus without complication ([place])   . Diabetic retinopathy ([place])   . Diastasis recti 12/31/2016  . Erectile dysfunction   . Erectile dysfunction associated with type 2 diabetes mellitus ([place]) 02/20/2015  . 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 Hyperlipidemia LDL goal <100 06/13/2015  . Hypertension   . Hypertension goal BP (blood pressure) < 130/80 06/13/2015  . Hypogonadism, male   . Obesity, Class III, BMI 40-49.9 (morbid obesity) ([place]) 02/20/2015  . Type 2 diabetes mellitus ([place]) 10/01/2015  . Uncontrolled type 2 diabetes mellitus with peripheral circulatory disorder ([place]) 06/13/2015    Past Surgical History:  Procedure Laterality Date  . COLONOSCOPY  2013    Prior to Admission medications   Medication Sig Start Date End Date Taking? Authorizing Provider  amLODipine (NORVASC) 5 MG tablet TAKE 1 TABLET (5 MG TOTAL) BY MOUTH DAILY. (FOR BLOOD PRESSURE) 09/29/17   Lada, Satira Anis, MD  aspirin EC 81 MG tablet Take 1 tablet (81 mg total) by mouth daily. To help prevent heart attack 07/13/16   Arnetha Courser, MD  atorvastatin (LIPITOR) 40 MG tablet Take 1 tablet (40 mg total) by mouth at bedtime. 04/04/18   Lada, Satira Anis, MD  B-D UF III MINI PEN NEEDLES 31G X 5 MM MISC FOR USE WITH INJECTABLE MEDICINE, ONCE A DAY 05/28/17   Lada, Satira Anis, MD  BESIVANCE 0.6 % SUSP PLACE 1 DROP INTO RIGHT EYE 4 TIMES A DAY FOR 2 DAYS AFTER EACH MONTHLY EYE INJECTION 09/30/17   [provider]  glipiZIDE (GLUCOTROL XL) 10 MG 24 hr tablet Take 1 tablet (10 mg total) by mouth daily. 04/04/18   Lada, Satira Anis, MD  glipiZIDE-metformin (METAGLIP)  2.5-500 MG tablet Take 2 tablets by mouth 2 (two) times daily. 09/02/17   [provider]  glucose blood (ONE TOUCH ULTRA TEST) test strip Use as instructed 09/12/15   Bobetta Lime, MD  hydrochlorothiazide (HYDRODIURIL) 25 MG tablet Take 1 tablet (25 mg total) by mouth daily. 04/04/18   Arnetha Courser, MD  Liraglutide (VICTOZA) 18 MG/3ML SOPN Inject 0.3 mLs (1.8 mg total) into the skin daily. 09/01/16   Lada, Satira Anis, MD  lisinopril (PRINIVIL,ZESTRIL) 40 MG tablet TAKE 1 TABLET (40 MG TOTAL) BY MOUTH DAILY. (FOR KIDNEY PROTECTION AND BLOOD PRESSURE) 01/23/18   Lada, Satira Anis, MD  Multiple Vitamin (MULTIVITAMIN) tablet Take 1 tablet by mouth daily.    [provider]  [facility] DELICA LANCETS FINE MISC 1 Device by Does not apply route 3 (three) times daily. 09/12/15   Bobetta Lime, MD  OZEMPIC 0.25 or 0.5 MG/DOSE [facility]  11/13/17   [provider]  prednisoLONE acetate (PRED FORTE) 1 % ophthalmic suspension INSTILL 1 DROP IN THE RIGHT EYE 3 TIMES A DAY FOR 5 DAYS THEN DISCONTINUE 11/06/17   [provider]  Semaglutide (OZEMPIC) 0.25 or 0.5 MG/DOSE SOPN Inject into the skin. 11/07/17   [provider]  sulfacetamide (BLEPH-10) 10 % ophthalmic solution Place 2 drops into the right eye every 3 (three) hours while awake. 10/04/17   Arnetha Courser, MD  XIGDUO XR  04-999 MG TB24 Take 2 tablets by mouth daily. 07/04/17   [provider]    Allergies as of 05/11/2018  . (No Known Allergies)    Family History  Problem Relation Age of Onset  . Diabetes Mother   . Kidney disease Mother   . Heart attack Mother   . Diabetes Brother   . Diabetes Brother   . Diabetes Maternal Grandfather   . Hypertension Maternal Grandfather   . Stroke Maternal Grandfather     Social History   Socioeconomic History  . Marital status: Married    Spouse name: Not on file  . Number of children: Not on file  . Years of education: Not on file  . Highest education  level: Not on file  Occupational History  . Not on file  Social Needs  . Financial resource strain: Not on file  . Food insecurity:    Worry: Not on file    Inability: Not on file  . Transportation needs:    Medical: Not on file    Non-medical: Not on file  Tobacco Use  . Smoking status: Never Smoker  . Smokeless tobacco: Never Used  Substance and Sexual Activity  . Alcohol use: No    Alcohol/week: 0.0 oz  . Drug use: No  . Sexual activity: Yes    Partners: Female  Lifestyle  . Physical activity:    Days per week: Not on file    Minutes per session: Not on file  . Stress: Not on file  Relationships  . Social connections:    Talks on phone: Not on file    Gets together: Not on file    Attends religious service: Not on file    Active member of club or organization: Not on file    Attends meetings of clubs or organizations: Not on file    Relationship status: Not on file  . Intimate partner violence:    Fear of current or ex partner: Not on file    Emotionally abused: Not on file    Physically abused: Not on file    Forced sexual activity: Not on file  Other Topics Concern  . Not on file  Social History Narrative  . Not on file    Review of Systems: See HPI, otherwise negative ROS  Physical Exam: BP (!) 149/95   Pulse 96   Temp (!) 96.2 F (35.7 C) (Tympanic)   Resp 17   Ht 5\' 10"  (1.778 m)   Wt 291 lb (132 kg)   SpO2 100%   BMI 41.75 kg/m  General:   Alert,  pleasant and cooperative in NAD Head:  Normocephalic and atraumatic. Neck:  Supple; no masses or thyromegaly. Lungs:  Clear throughout to auscultation, normal respiratory effort.    Heart:  +S1, +S2, Regular rate and rhythm, No edema. Abdomen:  Soft, nontender and nondistended. Normal bowel sounds, without guarding, and without rebound.   Neurologic:  Alert and  oriented x4;  grossly normal neurologically.  Impression/Plan: Clarence Black is here for an colonoscopy to be performed for Screening  colonoscopy average risk   Risks, benefits, limitations, and alternatives regarding  colonoscopy have been reviewed with the patient.  Questions have been answered.  All parties agreeable.   Jonathon Bellows, MD  06/02/2018, 9:23 AM

## 2018-06-05 LAB — HEMOGLOBIN A1C: Hemoglobin A1C: 8.4

## 2018-06-05 LAB — SURGICAL PATHOLOGY

## 2018-06-05 NOTE — Anesthesia Postprocedure Evaluation (Signed)
Anesthesia Post Note  Patient: Clarence Black  Procedure(s) Performed: COLONOSCOPY WITH PROPOFOL (N/A )  Patient location during evaluation: Endoscopy Anesthesia Type: General Level of consciousness: awake and alert Pain management: pain level controlled Vital Signs Assessment: post-procedure vital signs reviewed and stable Respiratory status: spontaneous breathing, nonlabored ventilation, respiratory function stable and patient connected to nasal cannula oxygen Cardiovascular status: blood pressure returned to baseline and stable Postop Assessment: no apparent nausea or vomiting Anesthetic complications: no     Last Vitals:  Vitals:   06/02/18 0849 06/02/18 0958  BP: (!) 149/95 101/65  Pulse: 96 96  Resp: 17 14  Temp: (!) 35.7 C (!) 36.1 C  SpO2: 100% 96%    Last Pain:  Vitals:   06/02/18 1016  TempSrc:   PainSc: 0-No pain                 Martha Clan

## 2018-06-06 ENCOUNTER — Encounter: Payer: Self-pay | Admitting: Gastroenterology

## 2018-06-09 ENCOUNTER — Encounter (INDEPENDENT_AMBULATORY_CARE_PROVIDER_SITE_OTHER): Payer: BLUE CROSS/BLUE SHIELD | Admitting: Ophthalmology

## 2018-06-09 DIAGNOSIS — H35033 Hypertensive retinopathy, bilateral: Secondary | ICD-10-CM | POA: Diagnosis not present

## 2018-06-09 DIAGNOSIS — E11311 Type 2 diabetes mellitus with unspecified diabetic retinopathy with macular edema: Secondary | ICD-10-CM | POA: Diagnosis not present

## 2018-06-09 DIAGNOSIS — I10 Essential (primary) hypertension: Secondary | ICD-10-CM

## 2018-06-09 DIAGNOSIS — E113313 Type 2 diabetes mellitus with moderate nonproliferative diabetic retinopathy with macular edema, bilateral: Secondary | ICD-10-CM | POA: Diagnosis not present

## 2018-06-09 DIAGNOSIS — H43813 Vitreous degeneration, bilateral: Secondary | ICD-10-CM | POA: Diagnosis not present

## 2018-06-11 ENCOUNTER — Encounter: Payer: Self-pay | Admitting: Gastroenterology

## 2018-06-15 ENCOUNTER — Encounter (INDEPENDENT_AMBULATORY_CARE_PROVIDER_SITE_OTHER): Payer: BLUE CROSS/BLUE SHIELD | Admitting: Ophthalmology

## 2018-06-23 ENCOUNTER — Encounter: Payer: Self-pay | Admitting: Family Medicine

## 2018-06-23 ENCOUNTER — Ambulatory Visit (INDEPENDENT_AMBULATORY_CARE_PROVIDER_SITE_OTHER): Payer: BLUE CROSS/BLUE SHIELD | Admitting: Family Medicine

## 2018-06-23 VITALS — BP 136/84 | HR 96 | Temp 98.2°F | Resp 12 | Ht 70.0 in | Wt 297.0 lb

## 2018-06-23 DIAGNOSIS — R7989 Other specified abnormal findings of blood chemistry: Secondary | ICD-10-CM

## 2018-06-23 DIAGNOSIS — E785 Hyperlipidemia, unspecified: Secondary | ICD-10-CM | POA: Diagnosis not present

## 2018-06-23 DIAGNOSIS — I10 Essential (primary) hypertension: Secondary | ICD-10-CM

## 2018-06-23 DIAGNOSIS — IMO0002 Reserved for concepts with insufficient information to code with codable children: Secondary | ICD-10-CM

## 2018-06-23 DIAGNOSIS — Z5181 Encounter for therapeutic drug level monitoring: Secondary | ICD-10-CM | POA: Diagnosis not present

## 2018-06-23 DIAGNOSIS — R5383 Other fatigue: Secondary | ICD-10-CM

## 2018-06-23 DIAGNOSIS — R6882 Decreased libido: Secondary | ICD-10-CM

## 2018-06-23 DIAGNOSIS — E1151 Type 2 diabetes mellitus with diabetic peripheral angiopathy without gangrene: Secondary | ICD-10-CM | POA: Diagnosis not present

## 2018-06-23 DIAGNOSIS — N529 Male erectile dysfunction, unspecified: Secondary | ICD-10-CM

## 2018-06-23 DIAGNOSIS — E1165 Type 2 diabetes mellitus with hyperglycemia: Secondary | ICD-10-CM

## 2018-06-23 MED ORDER — SILDENAFIL CITRATE 20 MG PO TABS: 60.0000 mg | ORAL_TABLET | Freq: Three times a day (TID) | ORAL | 3 refills | Status: DC

## 2018-06-23 NOTE — Patient Instructions (Addendum)
Check out the information at familydoctor.org entitled "Nutrition for Weight Loss: What You Need to Know about Fad Diets" Try to lose between 1-2 pounds per week by taking in fewer calories and burning off more calories You can succeed by limiting portions, limiting foods dense in calories and fat, becoming more active, and drinking 8 glasses of water a day (64 ounces) Don't skip meals, especially breakfast, as skipping meals may alter your metabolism Do not use over-the-counter weight loss pills or gimmicks that claim rapid weight loss A healthy BMI (or body mass index) is between 18.5 and 24.9 You can calculate your ideal BMI at the [place] website ClubMonetize.fr Please do see the urologist   Obesity, Adult Obesity is the condition of having too much total body fat. Being overweight or obese means that your weight is greater than what is considered healthy for your body size. Obesity is determined by a measurement called BMI. BMI is an estimate of body fat and is calculated from height and weight. For adults, a BMI of 30 or higher is considered obese. Obesity can eventually lead to other health concerns and major illnesses, including:  Stroke.  Coronary artery disease (CAD).  Type 2 diabetes.  Some types of cancer, including cancers of the colon, breast, uterus, and gallbladder.  Osteoarthritis.  High blood pressure (hypertension).  High cholesterol.  Sleep apnea.  Gallbladder stones.  Infertility problems.  What are the causes? The main cause of obesity is taking in (consuming) more calories than your body uses for energy. Other factors that contribute to this condition may include:  Being born with genes that make you more likely to become obese.  Having a medical condition that causes obesity. These conditions include: ? Hypothyroidism. ? Polycystic ovarian syndrome (PCOS). ? Binge-eating disorder. ? Cushing  syndrome.  Taking certain medicines, such as steroids, antidepressants, and seizure medicines.  Not being physically active (sedentary lifestyle).  Living where there are limited places to exercise safely or buy healthy foods.  Not getting enough sleep.  What increases the risk? The following factors may increase your risk of this condition:  Having a family history of obesity.  Being a woman of African-American descent.  Being a man of Hispanic descent.  What are the signs or symptoms? Having excessive body fat is the main symptom of this condition. How is this diagnosed? This condition may be diagnosed based on:  Your symptoms.  Your medical history.  A physical exam. Your health care provider may measure: ? Your BMI. If you are an adult with a BMI between 25 and less than 30, you are considered overweight. If you are an adult with a BMI of 30 or higher, you are considered obese. ? The distances around your hips and your waist (circumferences). These may be compared to each other to help diagnose your condition. ? Your skinfold thickness. Your health care provider may gently pinch a fold of your skin and measure it.  How is this treated? Treatment for this condition often includes changing your lifestyle. Treatment may include some or all of the following:  Dietary changes. Work with your health care provider and a dietitian to set a weight-loss goal that is healthy and reasonable for you. Dietary changes may include eating: ? Smaller portions. A portion size is the amount of a particular food that is healthy for you to eat at one time. This varies from person to person. ? Low-calorie or low-fat options. ? More whole grains, fruits, and vegetables.  Regular physical activity. This may include aerobic activity (cardio) and strength training.  Medicine to help you lose weight. Your health care provider may prescribe medicine if you are unable to lose 1 pound a week after 6  weeks of eating more healthily and doing more physical activity.  Surgery. Surgical options may include gastric banding and gastric bypass. Surgery may be done if: ? Other treatments have not helped to improve your condition. ? You have a BMI of 40 or higher. ? You have life-threatening health problems related to obesity.  Follow these instructions at home:  Eating and drinking   Follow recommendations from your health care provider about what you eat and drink. Your health care provider may advise you to: ? Limit fast foods, sweets, and processed snack foods. ? Choose low-fat options, such as low-fat milk instead of whole milk. ? Eat 5 or more servings of fruits or vegetables every day. ? Eat at home more often. This gives you more control over what you eat. ? Choose healthy foods when you eat out. ? Learn what a healthy portion size is. ? Keep low-fat snacks on hand. ? Avoid sugary drinks, such as soda, fruit juice, iced tea sweetened with sugar, and flavored milk. ? Eat a healthy breakfast.  Drink enough water to keep your urine clear or pale yellow.  Do not go without eating for long periods of time (do not fast) or follow a fad diet. Fasting and fad diets can be unhealthy and even dangerous. Physical Activity  Exercise regularly, as told by your health care provider. Ask your health care provider what types of exercise are safe for you and how often you should exercise.  Warm up and stretch before being active.  Cool down and stretch after being active.  Rest between periods of activity. Lifestyle  Limit the time that you spend in front of your TV, computer, or video game system.  Find ways to reward yourself that do not involve food.  Limit alcohol intake to no more than 1 drink a day for nonpregnant women and 2 drinks a day for men. One drink equals 12 oz of beer, 5 oz of wine, or 1 oz of hard liquor. General instructions  Keep a weight loss journal to keep track  of the food you eat and how much you exercise you get.  Take over-the-counter and prescription medicines only as told by your health care provider.  Take vitamins and supplements only as told by your health care provider.  Consider joining a support group. Your health care provider may be able to recommend a support group.  Keep all follow-up visits as told by your health care provider. This is important. Contact a health care provider if:  You are unable to meet your weight loss goal after 6 weeks of dietary and lifestyle changes. This information is not intended to replace advice given to you by your health care provider. Make sure you discuss any questions you have with your health care provider. Document Released: 01/20/2005 Document Revised: 05/17/2016 Document Reviewed: 10/01/2015 Elsevier Interactive Patient Education  2018 [place].  Preventing Unhealthy [employer], Adult Staying at a healthy weight is important. When fat builds up in your body, you may become overweight or obese. These conditions put you at greater risk for developing certain health problems, such as heart disease, diabetes, sleeping problems, joint problems, and some cancers. Unhealthy weight gain is often the result of making unhealthy choices in what you eat. It  is also a result of not getting enough exercise. You can make changes to your lifestyle to prevent obesity and stay as healthy as possible. What nutrition changes can be made? To maintain a healthy weight and prevent obesity:  Eat only as much as your body needs. To do this: ? Pay attention to signs that you are hungry or full. Stop eating as soon as you feel full. ? If you feel hungry, try drinking water first. Drink enough water so your urine is clear or pale yellow. ? Eat smaller portions. ? Look at serving sizes on food labels. Most foods contain more than one serving per container. ? Eat the recommended amount of calories for your gender and  activity level. While most active people should eat around 2,000 calories per day, if you are trying to lose weight or are not very active, you main need to eat less calories. Talk to your health care provider or dietitian about how many calories you should eat each day.  Choose healthy foods, such as: ? Fruits and vegetables. Try to fill at least half of your plate at each meal with fruits and vegetables. ? Whole grains, such as whole wheat bread, brown rice, and quinoa. ? Lean meats, such as chicken or fish. ? Other healthy proteins, such as beans, eggs, or tofu. ? Healthy fats, such as nuts, seeds, fatty fish, and olive oil. ? Low-fat or fat-free dairy.  Check food labels and avoid food and drinks that: ? Are high in calories. ? Have added sugar. ? Are high in sodium. ? Have saturated fats or trans fats.  Limit how much you eat of the following foods: ? Prepackaged meals. ? Fast food. ? Fried foods. ? Processed meat, such as bacon, sausage, and deli meats. ? Fatty cuts of red meat and poultry with skin.  Cook foods in healthier ways, such as by baking, broiling, or grilling.  When grocery shopping, try to shop around the outside of the store. This helps you buy mostly fresh foods and avoid canned and prepackaged foods.  What lifestyle changes can be made?  Exercise at least 30 minutes 5 or more days each week. Exercising includes brisk walking, yard work, biking, running, swimming, and team sports like basketball and soccer. Ask your health care provider which exercises are safe for you.  Do not use any products that contain nicotine or tobacco, such as cigarettes and e-cigarettes. If you need help quitting, ask your health care provider.  Limit alcohol intake to no more than 1 drink a day for nonpregnant women and 2 drinks a day for men. One drink equals 12 oz of beer, 5 oz of wine, or 1 oz of hard liquor.  Try to get 7-9 hours of sleep each night. What other changes can be  made?  Keep a food and activity journal to keep track of: ? What you ate and how many calories you had. Remember to count sauces, dressings, and side dishes. ? Whether you were active, and what exercises you did. ? Your calorie, weight, and activity goals.  Check your weight regularly. Track any changes. If you notice you have gained weight, make changes to your diet or activity routine.  Avoid taking weight-loss medicines or supplements. Talk to your health care provider before starting any new medicine or supplement.  Talk to your health care provider before trying any new diet or exercise plan. Why are these changes important? Eating healthy, staying active, and having healthy habits not  only help prevent obesity, they also:  Help you to manage stress and emotions.  Help you to connect with friends and family.  Improve your self-esteem.  Improve your sleep.  Prevent long-term health problems.  What can happen if changes are not made? Being obese or overweight can cause you to develop joint or bone problems, which can make it hard for you to stay active or do activities you enjoy. Being obese or overweight also puts stress on your heart and lungs and can lead to health problems like diabetes, heart disease, and some cancers. Where to find more information: Talk with your health care provider or a dietitian about healthy eating and healthy lifestyle choices. You may also find other information through these resources:  U.S. Department of Agriculture MyPlate: FormerBoss.no  American Heart Association: www.heart.org  Centers for Disease Control and Prevention: http://www.wolf.info/  Summary  Staying at a healthy weight is important. It helps prevent certain diseases and health problems, such as heart disease, diabetes, joint problems, sleep disorders, and some cancers.  Being obese or overweight can cause you to develop joint or bone problems, which can make it hard for you to  stay active or do activities you enjoy.  You can prevent unhealthy weight gain by eating a healthy diet, exercising regularly, not smoking, limiting alcohol, and getting enough sleep.  Talk with your health care provider or a dietitian for guidance about healthy eating and healthy lifestyle choices. This information is not intended to replace advice given to you by your health care provider. Make sure you discuss any questions you have with your health care provider. Document Released: 12/14/2016 Document Revised: 01/19/2017 Document Reviewed: 01/19/2017 Elsevier Interactive Patient Education  [employer].

## 2018-06-23 NOTE — Progress Notes (Signed)
BP 136/84   Pulse 96   Temp 98.2 F (36.8 C) (Oral)   Resp 12   Ht 5\' 10"  (1.778 m)   Wt 297 lb (134.7 kg)   SpO2 96%   BMI 42.62 kg/m    Subjective:    Patient ID: Clarence Black, male    DOB: Jul 22, 1966, 52 y.o.   MRN: [id number]  HPI: Clarence Black is a 52 y.o. male  Chief Complaint  Patient presents with  . Follow-up    HPI Patient is here for f/u  Type 2 diabetes; medicine list at start of visit included glipizide and glipizide-metformin and ozempic and xigduo and victoza; aspirin guide 16.2%, aspirin recommended  Per last note with Dr. Gabriel Carina: glipizide, Merleen Nicely, and Victoza  Xigduo just refilled by Dr. 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 Gabriel Carina; BP at her office on 06/12/2018 was 154/98  High cholesterol; on statin Lab Results  Component Value Date   CHOL 130 06/23/2018   HDL 35 (L) 06/23/2018   LDLCALC 62 06/23/2018   TRIG 282 (H) 06/23/2018   CHOLHDL 3.7 06/23/2018   Morbid obesity; Dr. Gabriel Carina has ordered an in-home sleep study He asked her about this; it might be affecting things  Sexually, his drive is affected and his performance is affected as well; asked if BP medicine is part of the issue He works 3rd shift; 10 am until 6 pm  Depression screen [facility] 2/9 06/23/2018 09/23/2017 01/14/2017 12/31/2016 11/12/2016  Decreased Interest 0 0 0 0 0  Down, Depressed, Hopeless 0 0 0 0 0  PHQ - 2 Score 0 0 0 0 0    Relevant past medical, surgical, family and social history reviewed Past Medical History:  Diagnosis Date  . Diabetes mellitus  without complication ([place])   . Diabetic retinopathy ([place])   . Diastasis recti 12/31/2016  . Erectile dysfunction   . Erectile dysfunction associated with type 2 diabetes mellitus ([place]) 02/20/2015  . 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 Hyperlipidemia LDL goal <100 06/13/2015  . Hypertension   . Hypertension goal BP (blood pressure) < 130/80 06/13/2015  . Hypogonadism, male   . Obesity, Class III, BMI 40-49.9 (morbid obesity) ([place]) 02/20/2015  . Type 2 diabetes mellitus ([place]) 10/01/2015  . Uncontrolled type 2 diabetes mellitus with peripheral circulatory disorder ([place]) 06/13/2015   Past Surgical History:  Procedure Laterality Date  . COLONOSCOPY  2013  . COLONOSCOPY WITH PROPOFOL N/A 06/02/2018   Procedure: COLONOSCOPY WITH PROPOFOL;  Surgeon: Jonathon Bellows, MD;  Location: [facility] ENDOSCOPY;  Service: Gastroenterology;  Laterality: N/A;   Family History  Problem Relation Age of Onset  . Diabetes Mother   . Kidney disease Mother   . Heart attack Mother   . Diabetes Brother   . Diabetes Brother   . Diabetes Maternal Grandfather   . Hypertension Maternal Grandfather   . Stroke Maternal Grandfather    Social History   Tobacco Use  . Smoking status: Never Smoker  . Smokeless tobacco: Never Used  Substance Use Topics  . Alcohol use: No    Alcohol/week: 0.0 oz  .  Drug use: No    Interim medical history since last visit reviewed. Allergies and medications reviewed  Review of Systems  Cardiovascular: Negative for palpitations.   Per HPI unless specifically indicated above     Objective:    BP 136/84   Pulse 96   Temp 98.2 F (36.8 C) (Oral)   Resp 12   Ht 5\' 10"  (1.778 m)   Wt 297 lb (134.7 kg)   SpO2 96%   BMI 42.62 kg/m   Wt Readings from Last 3 Encounters:  06/23/18 297 lb (134.7 kg)  06/02/18 291 lb (132 kg)  09/23/17 291 lb 11.2 oz (132.3 kg)    Physical Exam  Constitutional: He appears well-developed and well-nourished. No distress.  Morbidly obese  HENT:  Head:  Normocephalic and atraumatic.  Eyes: EOM are normal. No scleral icterus.  Neck: No thyromegaly present.  Cardiovascular: Normal rate and regular rhythm.  Pulmonary/Chest: Effort normal and breath sounds normal.  Abdominal: Soft. Bowel sounds are normal. He exhibits no distension.  Musculoskeletal: He exhibits no edema.  Neurological: Coordination normal.  Skin: Skin is warm and dry. No pallor.  Psychiatric: He has a normal mood and affect. His behavior is normal. Judgment and thought content normal.   Diabetic Foot Form - Detailed   Diabetic Foot Exam - detailed Diabetic Foot exam was performed with the following findings:  Yes 06/23/2018  5:43 PM  Pulse Foot Exam completed.:  Yes  Right Dorsalis Pedis:  Present Left Dorsalis Pedis:  Present  Sensory Foot Exam Completed.:  Yes Semmes-Weinstein Monofilament Test R Site 1-Great Toe:  Pos L Site 1-Great Toe:  Pos          Assessment & Plan:   Problem List Items Addressed This Visit      Cardiovascular and Mediastinum   Uncontrolled type 2 diabetes mellitus with peripheral circulatory disorder (HCC) - Primary (Chronic)    Managed by endo      Relevant Medications   glipiZIDE (GLUCOTROL XL) 10 MG 24 hr tablet   sildenafil (REVATIO) 20 MG tablet   Hypertension goal BP (blood pressure) < 130/80 (Chronic)    Not quite to goal; DASH guidelines and weight loss      Relevant Medications   sildenafil (REVATIO) 20 MG tablet     Other   Hyperlipidemia LDL goal <100 (Chronic)   Relevant Medications   sildenafil (REVATIO) 20 MG tablet   Other Relevant Orders   Lipid panel (Completed)   Obesity, Class III, BMI 40-49.9 (morbid obesity) (HCC) (Chronic)    Encouraged patient to lose weight;; explained that the obesity could contribute to the low T      Relevant Medications   glipiZIDE (GLUCOTROL XL) 10 MG 24 hr tablet   Low testosterone in male    Reviewed prior level; he has upcoming appointment with urology       Other Visit  Diagnoses    Erectile dysfunction, unspecified erectile dysfunction type       Libido, decreased       Other fatigue       Relevant Orders   VITAMIN D 25 Hydroxy (Vit-D Deficiency, Fractures) (Completed)   TSH (Completed)   CBC with Differential/Platelet (Completed)   Medication monitoring encounter       Relevant Orders   COMPLETE METABOLIC PANEL WITH GFR (Completed)       Follow up plan: Return in about 6 months (around 12/23/2018) for follow-up visit with Dr. Sanda Klein.  An after-visit summary was printed and  given to the patient at [place].  Please see the patient instructions which may contain other information and recommendations beyond what is mentioned above in the assessment and plan.  Meds ordered this encounter  Medications  . DISCONTD: sildenafil (REVATIO) 20 MG tablet    Sig: Take 3-5 tablets (60-100 mg total) by mouth 3 (three) times daily.    Dispense:  30 tablet    Refill:  3  . sildenafil (REVATIO) 20 MG tablet    Sig: Take 3-5 tablets (60-100 mg total) by mouth 3 (three) times daily.    Dispense:  30 tablet    Refill:  3    Orders Placed This Encounter  Procedures  . VITAMIN D 25 Hydroxy (Vit-D Deficiency, Fractures)  . TSH  . CBC with Differential/Platelet  . COMPLETE METABOLIC PANEL WITH GFR  . Lipid panel

## 2018-06-23 NOTE — Assessment & Plan Note (Signed)
Managed by endo

## 2018-06-24 LAB — CBC WITH DIFFERENTIAL/PLATELET
BASOS ABS: 31 {cells}/uL (ref 0–200)
Basophils Relative: 0.4 %
EOS ABS: 131 {cells}/uL (ref 15–500)
Eosinophils Relative: 1.7 %
HEMATOCRIT: 46.5 % (ref 38.5–50.0)
Hemoglobin: 15.7 g/dL (ref 13.2–17.1)
Lymphs Abs: 2233 cells/uL (ref 850–3900)
MCH: 28.3 pg (ref 27.0–33.0)
MCHC: 33.8 g/dL (ref 32.0–36.0)
MCV: 83.9 fL (ref 80.0–100.0)
MPV: 12 fL (ref 7.5–12.5)
Monocytes Relative: 7.2 %
NEUTROS PCT: 61.7 %
Neutro Abs: 4751 cells/uL (ref 1500–7800)
Platelets: 176 10*3/uL (ref 140–400)
RBC: 5.54 10*6/uL (ref 4.20–5.80)
RDW: 14.3 % (ref 11.0–15.0)
Total Lymphocyte: 29 %
WBC: 7.7 10*3/uL (ref 3.8–10.8)
WBCMIX: 554 {cells}/uL (ref 200–950)

## 2018-06-24 LAB — COMPLETE METABOLIC PANEL WITH GFR
AG RATIO: 1.7 (calc) (ref 1.0–2.5)
ALT: 22 U/L (ref 9–46)
AST: 27 U/L (ref 10–35)
Albumin: 4.5 g/dL (ref 3.6–5.1)
Alkaline phosphatase (APISO): 66 U/L (ref 40–115)
BUN: 22 mg/dL (ref 7–25)
CALCIUM: 9.8 mg/dL (ref 8.6–10.3)
CO2: 26 mmol/L (ref 20–32)
Chloride: 103 mmol/L (ref 98–110)
Creat: 1.32 mg/dL (ref 0.70–1.33)
GFR, EST AFRICAN AMERICAN: 72 mL/min/{1.73_m2} (ref >=60)
GFR, Est Non African American: 62 mL/min/{1.73_m2} (ref >=60)
Globulin: 2.7 g/dL (calc) (ref 1.9–3.7)
Glucose, Bld: 241 mg/dL — ABNORMAL HIGH (ref 65–139)
POTASSIUM: 3.9 mmol/L (ref 3.5–5.3)
Sodium: 140 mmol/L (ref 135–146)
TOTAL PROTEIN: 7.2 g/dL (ref 6.1–8.1)
Total Bilirubin: 0.8 mg/dL (ref 0.2–1.2)

## 2018-06-24 LAB — VITAMIN D 25 HYDROXY (VIT D DEFICIENCY, FRACTURES): VIT D 25 HYDROXY: 21 ng/mL — AB (ref 30–100)

## 2018-06-24 LAB — LIPID PANEL
Cholesterol: 130 mg/dL (ref <200)
HDL: 35 mg/dL — ABNORMAL LOW (ref >40)
LDL CHOLESTEROL (CALC): 62 mg/dL
Non-HDL Cholesterol (Calc): 95 mg/dL (calc) (ref <130)
TRIGLYCERIDES: 282 mg/dL — AB (ref <150)
Total CHOL/HDL Ratio: 3.7 (calc) (ref <5.0)

## 2018-06-24 LAB — TSH: TSH: 1.51 m[IU]/L (ref 0.40–4.50)

## 2018-06-24 NOTE — Assessment & Plan Note (Signed)
Reviewed prior level; he has upcoming appointment with urology

## 2018-06-24 NOTE — Assessment & Plan Note (Addendum)
Encouraged patient to lose weight;; explained that the obesity could contribute to the low T

## 2018-06-24 NOTE — Assessment & Plan Note (Signed)
Not quite to goal; DASH guidelines and weight loss

## 2018-06-26 ENCOUNTER — Other Ambulatory Visit: Payer: Self-pay | Admitting: Family Medicine

## 2018-06-26 MED ORDER — VITAMIN D (ERGOCALCIFEROL) 1.25 MG (50000 UNIT) PO CAPS: 50000.0000 [IU] | ORAL_CAPSULE | ORAL | 0 refills | Status: AC

## 2018-06-26 NOTE — Progress Notes (Signed)
Vitamin D Rx once a week for 4 weeks, then 1000 iu daily OTC

## 2018-06-29 ENCOUNTER — Other Ambulatory Visit: Payer: Self-pay | Admitting: Family Medicine

## 2018-06-30 NOTE — Telephone Encounter (Signed)
Last lipid panel and sgpt reviewed Rx approved

## 2018-07-04 ENCOUNTER — Other Ambulatory Visit: Payer: Self-pay | Admitting: Family Medicine

## 2018-07-04 ENCOUNTER — Telehealth: Payer: Self-pay | Admitting: Family Medicine

## 2018-07-04 MED ORDER — SILDENAFIL CITRATE 20 MG PO TABS: 60.0000 mg | ORAL_TABLET | Freq: Every day | ORAL | 3 refills | Status: DC | PRN

## 2018-07-04 NOTE — Telephone Encounter (Signed)
New Rx sent.

## 2018-07-04 NOTE — Telephone Encounter (Signed)
Copied from [place] [id number]. Topic: Quick Communication - Rx Refill/Question >> Jul 04, 2018  9:21 AM Scherrie Gerlach wrote: Medication: sildenafil (REVATIO) 20 MG tablet  Pharmacy calling to clarify instructions on this med. Is this correct?  : "Take 3-5 tablets (60-100 mg total) by mouth 3 (three) times daily. - Oral" Please call back to clarify. [facility]) [place], [place] - [place] [phone] (Phone) [phone] (Fax)

## 2018-07-14 ENCOUNTER — Encounter (INDEPENDENT_AMBULATORY_CARE_PROVIDER_SITE_OTHER): Payer: BLUE CROSS/BLUE SHIELD | Admitting: Ophthalmology

## 2018-07-14 DIAGNOSIS — E113313 Type 2 diabetes mellitus with moderate nonproliferative diabetic retinopathy with macular edema, bilateral: Secondary | ICD-10-CM

## 2018-07-14 DIAGNOSIS — I10 Essential (primary) hypertension: Secondary | ICD-10-CM

## 2018-07-14 DIAGNOSIS — H43813 Vitreous degeneration, bilateral: Secondary | ICD-10-CM | POA: Diagnosis not present

## 2018-07-14 DIAGNOSIS — H35033 Hypertensive retinopathy, bilateral: Secondary | ICD-10-CM

## 2018-07-14 DIAGNOSIS — E11311 Type 2 diabetes mellitus with unspecified diabetic retinopathy with macular edema: Secondary | ICD-10-CM | POA: Diagnosis not present

## 2018-07-14 DIAGNOSIS — H2513 Age-related nuclear cataract, bilateral: Secondary | ICD-10-CM | POA: Diagnosis not present

## 2018-07-18 ENCOUNTER — Other Ambulatory Visit: Payer: Self-pay | Admitting: Family Medicine

## 2018-07-22 ENCOUNTER — Other Ambulatory Visit: Payer: Self-pay | Admitting: Family Medicine

## 2018-07-23 ENCOUNTER — Other Ambulatory Visit: Payer: Self-pay | Admitting: Family Medicine

## 2018-07-23 NOTE — Progress Notes (Signed)
07/24/2018 3:56 PM   [name] [birth year] [id number]  Referring provider: Arnetha Courser, MD [address] [place], [place] [address]  Chief Complaint  Patient presents with  . Erectile Dysfunction    New Patient    HPI: Patient is a 52 year old African American male with a history of testosterone deficiency, ED and BPH with LU TS who presents today to reestablish urological care.  Testosterone deficiency Patient is experiencing a decrease in libido, a lack of energy, a decrease in strength, erections being less strong, a recent deterioration in an ability to play sports and falling asleep after dinner.  This is indicated by his responses to the ADAM questionnaire.  He is no longer having spontaneous erections at night.   He has not been tested for sleep apnea, but he is scheduled.  He is reporting obesity and Type 2 diabetes mellitus.   Androgen Deficiency in the Aging Male    [place] Name 07/24/18 1400         Androgen Deficiency in the Aging Male   Do you have a decrease in libido (sex drive)  Yes     Do you have lack of energy  Yes     Do you have a decrease in strength and/or endurance  Yes     Have you lost height  No     Have you noticed a decreased "enjoyment of life"  No     Are you sad and/or grumpy  No     Are your erections less strong  Yes     Have you noticed a recent deterioration in your ability to play sports  Yes     Are you falling asleep after dinner  Yes     Has there been a recent deterioration in your work performance  No       Erectile dysfunction His SHIM score is 10, which is moderate ED.   He has been having difficulty with erections for a few years.   His major complaint is lack of firmness.  His libido is diminished.  His risk factors for ED are age, BPH,  testosterone deficiency, DM, HTN and HLD.  He denies any painful erections or curvatures with his erections.   He states that the sildenafil 20 mg, 5 tablets, without good  erections.   SHIM    Row Name 07/24/18 1429         SHIM: Over the last 6 months:   How do you rate your confidence that you could get and keep an erection?  Low     When you had erections with sexual stimulation, how often were your erections hard enough for penetration (entering your partner)?  A Few Times (much less than half the time)     During sexual intercourse, how often were you able to maintain your erection after you had penetrated (entered) your partner?  A Few Times (much less than half the time)     During sexual intercourse, how difficult was it to maintain your erection to completion of intercourse?  Very Difficult     When you attempted sexual intercourse, how often was it satisfactory for you?  A Few Times (much less than half the time)       SHIM Total Score   SHIM  10        Score: 1-7 Severe ED 8-11 Moderate ED 12-16 Mild-Moderate ED 17-21 Mild ED 22-25 No ED   BPH WITH LUTS  (  prostate and/or bladder) IPSS score: 9/3        Major complaint(s):  Nocturia x few years. Denies any dysuria, hematuria or suprapubic pain.   Denies any recent fevers, chills, nausea or vomiting.  He does not have a family history of PCa.  IPSS    Row Name 07/24/18 1400         International Prostate Symptom Score   How often have you had the sensation of not emptying your bladder?  Less than 1 in 5     How often have you had to urinate less than every two hours?  Less than half the time     How often have you found you stopped and started again several times when you urinated?  Less than half the time     How often have you found it difficult to postpone urination?  Less than half the time     How often have you had a weak urinary stream?  Not at All     How often have you had to strain to start urination?  Less than 1 in 5 times     How many times did you typically get up at night to urinate?  1 Time     Total IPSS Score  9       Quality of Life due to urinary symptoms     If you were to spend the rest of your life with your urinary condition just the way it is now how would you feel about that?  Mixed        Score:  1-7 Mild 8-19 Moderate 20-35 Severe  Reviewed referral notes.    PMH: Past Medical History:  Diagnosis Date  . Diabetes mellitus without complication ([place])   . Diabetic retinopathy ([place])   . Diastasis recti 12/31/2016  . Erectile dysfunction   . Erectile dysfunction associated with type 2 diabetes mellitus ([place]) 02/20/2015  . 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 Hyperlipidemia LDL goal <100 06/13/2015  . Hypertension   . Hypertension goal BP (blood pressure) < 130/80 06/13/2015  . Hypogonadism, male   . Obesity, Class III, BMI 40-49.9 (morbid obesity) ([place]) 02/20/2015  . Type 2 diabetes mellitus ([place]) 10/01/2015  . Uncontrolled type 2 diabetes mellitus with peripheral circulatory disorder ([place]) 06/13/2015    Surgical History: Past Surgical History:  Procedure Laterality Date  . COLONOSCOPY  2013  . COLONOSCOPY WITH PROPOFOL N/A 06/02/2018   Procedure: COLONOSCOPY WITH PROPOFOL;  Surgeon: Jonathon Bellows, MD;  Location: [facility] ENDOSCOPY;  Service: Gastroenterology;  Laterality: N/A;    Home Medications:  Allergies as of 07/24/2018   No Known Allergies     Medication List        Accurate as of 07/24/18  3:56 PM. Always use your most recent med list.          amLODipine 5 MG tablet Commonly known as:  NORVASC TAKE 1 TABLET (5 MG TOTAL) BY MOUTH DAILY. (FOR BLOOD PRESSURE)   aspirin EC 81 MG tablet Take 1 tablet (81 mg total) by mouth daily. To help prevent heart attack   atorvastatin 40 MG tablet Commonly known as:  LIPITOR TAKE 1 TABLET BY MOUTH EVERYDAY AT BEDTIME   B-D UF III MINI PEN NEEDLES 31G X 5 MM Misc Generic drug:  Insulin Pen Needle FOR USE WITH INJECTABLE MEDICINE, ONCE A DAY   BESIVANCE 0.6 % Susp Generic drug:  Besifloxacin HCl PLACE 1 DROP INTO RIGHT EYE 4 TIMES A DAY  FOR 2 DAYS AFTER EACH MONTHLY EYE INJECTION    glipiZIDE 10 MG 24 hr tablet Commonly known as:  GLUCOTROL XL Take 2 tablets (20 mg total) by mouth daily.   glucose blood test strip Commonly known as:  ONE TOUCH ULTRA TEST Use as instructed   hydrochlorothiazide 25 MG tablet Commonly known as:  HYDRODIURIL TAKE 1 TABLET BY MOUTH EVERY DAY   liraglutide 18 MG/3ML Sopn Commonly known as:  VICTOZA Inject 0.3 mLs (1.8 mg total) into the skin daily.   lisinopril 40 MG tablet Commonly known as:  PRINIVIL,ZESTRIL TAKE 1 TABLET (40 MG TOTAL) BY MOUTH DAILY. (FOR KIDNEY PROTECTION AND BLOOD PRESSURE)   multivitamin tablet Take 1 tablet by mouth daily.   ONETOUCH DELICA LANCETS FINE Misc 1 Device by Does not apply route 3 (three) times daily.   prednisoLONE acetate 1 % ophthalmic suspension Commonly known as:  PRED FORTE INSTILL 1 DROP IN THE RIGHT EYE 3 TIMES A DAY FOR 5 DAYS THEN DISCONTINUE   sildenafil 20 MG tablet Commonly known as:  REVATIO Take 3-5 tablets (60-100 mg total) by mouth daily as needed.   sulfacetamide 10 % ophthalmic solution Commonly known as:  BLEPH-10 Place 2 drops into the right eye every 3 (three) hours while awake.   Vitamin D (Ergocalciferol) 50000 units Caps capsule Commonly known as:  DRISDOL Take 1 capsule (50,000 Units total) by mouth every 7 (seven) days.   XIGDUO XR 04-999 MG Tb24 Generic drug:  Dapagliflozin-metFORMIN HCl ER Take 2 tablets by mouth daily.       Allergies: No Known Allergies  Family History: Family History  Problem Relation Age of Onset  . Diabetes Mother   . Kidney disease Mother   . Heart attack Mother   . Diabetes Brother   . Diabetes Brother   . Diabetes Maternal Grandfather   . Hypertension Maternal Grandfather   . Stroke Maternal Grandfather     Social History:  reports that he has never smoked. He has never used smokeless tobacco. He reports that he does not drink alcohol or use drugs.  ROS: UROLOGY Frequent Urination?: No Hard to postpone  urination?: No Burning/pain with urination?: No Get up at night to urinate?: Yes Leakage of urine?: No Urine stream starts and stops?: No Trouble starting stream?: No Do you have to strain to urinate?: No Blood in urine?: No Urinary tract infection?: No Sexually transmitted disease?: No Injury to kidneys or bladder?: No Painful intercourse?: No Weak stream?: No Erection problems?: No Penile pain?: No  Gastrointestinal Nausea?: No Vomiting?: No Indigestion/heartburn?: No Diarrhea?: No Constipation?: No  Constitutional Fever: No Night sweats?: Yes Weight loss?: Yes Fatigue?: No  Skin Skin rash/lesions?: No Itching?: No  Eyes Blurred vision?: No Double vision?: No  Ears/Nose/Throat Sore throat?: No Sinus problems?: No  Hematologic/Lymphatic Swollen glands?: No Easy bruising?: No  Cardiovascular Leg swelling?: No Chest pain?: No  Respiratory Cough?: No Shortness of breath?: No  Endocrine Excessive thirst?: No  Musculoskeletal Back pain?: No Joint pain?: No  Neurological Headaches?: No Dizziness?: No  Psychologic Depression?: No Anxiety?: No  Physical Exam: BP 122/74   Pulse (!) 103   Ht 5\' 10"  (1.778 m)   Wt 290 lb (131.5 kg)   BMI 41.61 kg/m   Constitutional:  Well nourished. Alert and oriented, No acute distress. HEENT: [place] AT, moist mucus membranes.  Trachea midline, no masses. Cardiovascular: No clubbing, cyanosis, or edema. Respiratory: Normal respiratory effort, no increased work of breathing. GI: Abdomen is  soft, non tender, non distended, no abdominal masses. Liver and spleen not palpable.  No hernias appreciated.  Stool sample for occult testing is not indicated.   GU: No CVA tenderness.  No bladder fullness or masses.  Patient with circumcised phallus.  Urethral meatus is patent.  No penile discharge. No penile lesions or rashes. Scrotum without lesions, cysts, rashes and/or edema.  Testicles are located scrotally bilaterally. No  masses are appreciated in the testicles. Left and right epididymis are normal. Rectal: Patient with  normal sphincter tone. Anus and perineum without scarring or rashes. No rectal masses are appreciated. Prostate is approximately 50 grams, could only palpate the apex, no  nodules are appreciated.  Skin: No rashes, bruises or suspicious lesions. Lymph: No cervical or inguinal adenopathy. Neurologic: Grossly intact, no focal deficits, moving all 4 extremities. Psychiatric: Normal mood and affect.  Laboratory Data: Lab Results  Component Value Date   WBC 7.7 06/23/2018   HGB 15.7 06/23/2018   HCT 46.5 06/23/2018   MCV 83.9 06/23/2018   PLT 176 06/23/2018    Lab Results  Component Value Date   CREATININE 1.32 06/23/2018    Lab Results  Component Value Date   PSA 0.3ng/dL 05/27/2015  PSA   0.31 in 08/2014  Lab Results  Component Value Date   TESTOSTERONE 199 (L) 12/16/2015    Lab Results  Component Value Date   HGBA1C 8.4 06/05/2018    Lab Results  Component Value Date   TSH 1.51 06/23/2018       Component Value Date/Time   CHOL 130 06/23/2018 1532   CHOL 205 (H) 12/16/2015 1038   HDL 35 (L) 06/23/2018 1532   HDL 36 (L) 12/16/2015 1038   CHOLHDL 3.7 06/23/2018 1532   VLDL 26 10/22/2016 1716   LDLCALC 62 06/23/2018 1532    Lab Results  Component Value Date   AST 27 06/23/2018   Lab Results  Component Value Date   ALT 22 06/23/2018   No components found for: ALKALINEPHOPHATASE No components found for: BILIRUBINTOTAL  No results found for: ESTRADIOL  Urinalysis No results found for: COLORURINE, APPEARANCEUR, LABSPEC, PHURINE, GLUCOSEU, HGBUR, BILIRUBINUR, KETONESUR, PROTEINUR, UROBILINOGEN, NITRITE, LEUKOCYTESUR  I have reviewed the labs.   Assessment & Plan:    1. Testosterone deficiency I explained to patient that the current guidelines from the [place] reports the diagnosis of hypogonadism requires a morning serum total testosterone level at least 2  days apart that is below 300 ng/dL - most insurances require the blood work to be done before 9 am.   At this time, the patient does not meet this requirement.  He will return for two morning serum testosterones, two days apart before 10 AM  If the testosterones levels return below 300 ng/dL we will need to do additional blood work [facility] - if that returns below normal or low normal we will need to add a prolactin) - if that blood work is abnormal - we will need to refer to endocrinology I discussed with the patient the side effects of testosterone therapy, such as: enlargement of the prostate gland that may in turn cause LUTS, possible increased risk of PCa, DVT's and/or PE's, possible increased risk of heart attack or stroke, lower sperm count, swelling of the ankles, feet, or body, with or without heart failure, enlarged or painful breasts, have problems breathing while you sleep (sleep apnea), increased prostate specific antigen, mood swings, hypertension and increased red blood cell count - we will need to check  HCT/HBG levels prior to therapy and PSA if the patient is over 40  2. Erectile dysfunction SHIM score is 10 I explained to the patient that in order to achieve an erection it takes good functioning of the nervous system (parasympathetic and rs, sympathetic, sensory and motor), good blood flow into the erectile tissue of the penis and a desire to have sex I explained that conditions like diabetes, hypertension, coronary artery disease, peripheral vascular disease, smoking, alcohol consumption, age, sleep apnea and BPH can diminish the ability to have an erection A recent study published in Sex Med 2018 Apr 13 revealed moderate to vigorous aerobic exercise for 40 minutes 4 times per week can decrease erectile problems caused by physical inactivity, obesity, hypertension, metabolic syndrome and/or cardiovascular diseases Would like to try the EDEX at this time  3. BPH with LUTS IPSS score is  9/3 Continue conservative management, avoiding bladder irritants and timed voiding's RTC pending PSA results   Return for pending PSA results .  These notes generated with voice recognition software. I apologize for typographical errors.  Zara Council, PA-C  [facility] [address]  [place], [place] [address] [phone]

## 2018-07-24 ENCOUNTER — Telehealth: Payer: Self-pay | Admitting: Urology

## 2018-07-24 ENCOUNTER — Ambulatory Visit: Payer: BLUE CROSS/BLUE SHIELD | Admitting: Urology

## 2018-07-24 ENCOUNTER — Encounter: Payer: Self-pay | Admitting: Urology

## 2018-07-24 VITALS — BP 122/74 | HR 103 | Ht 70.0 in | Wt 290.0 lb

## 2018-07-24 DIAGNOSIS — E349 Endocrine disorder, unspecified: Secondary | ICD-10-CM

## 2018-07-24 DIAGNOSIS — N401 Enlarged prostate with lower urinary tract symptoms: Secondary | ICD-10-CM | POA: Diagnosis not present

## 2018-07-24 DIAGNOSIS — N529 Male erectile dysfunction, unspecified: Secondary | ICD-10-CM | POA: Diagnosis not present

## 2018-07-24 DIAGNOSIS — N138 Other obstructive and reflux uropathy: Secondary | ICD-10-CM

## 2018-07-24 NOTE — Telephone Encounter (Signed)
Would you send the [facility] script for this patient?

## 2018-07-24 NOTE — Telephone Encounter (Signed)
Cr and K+ reviewed Rx approved 

## 2018-07-25 LAB — PSA: Prostate Specific Ag, Serum: 0.3 ng/mL (ref 0.0–4.0)

## 2018-07-25 LAB — TESTOSTERONE: TESTOSTERONE: 111 ng/dL — AB (ref 264–916)

## 2018-07-25 NOTE — Telephone Encounter (Signed)
-----   Message from Nori Riis, PA-C sent at 07/25/2018  7:40 AM EDT ----- Please notify Mr. Kmetz that his PSA was stable at 0.3, but his serum testosterone was 111.  If he is interested in restarting testosterone therapy, we may have to start from the beginning.  Meaning that his insurance will most likely require [place] to have two morning testosterones that are low on record before they will cover any testosterone therapy.

## 2018-07-25 NOTE — Telephone Encounter (Signed)
Pt is going to come in on Aug 5 for testosterone lab

## 2018-07-31 ENCOUNTER — Other Ambulatory Visit: Payer: Self-pay | Admitting: Family Medicine

## 2018-07-31 ENCOUNTER — Other Ambulatory Visit: Payer: BLUE CROSS/BLUE SHIELD

## 2018-07-31 DIAGNOSIS — E349 Endocrine disorder, unspecified: Secondary | ICD-10-CM

## 2018-08-01 ENCOUNTER — Other Ambulatory Visit: Payer: Self-pay

## 2018-08-01 ENCOUNTER — Telehealth: Payer: Self-pay

## 2018-08-01 DIAGNOSIS — E349 Endocrine disorder, unspecified: Secondary | ICD-10-CM

## 2018-08-01 LAB — TESTOSTERONE: TESTOSTERONE: 178 ng/dL — AB (ref 264–916)

## 2018-08-01 NOTE — Telephone Encounter (Signed)
-----   Message from Nori Riis, PA-C sent at 08/01/2018  8:54 AM EDT ----- Please let Mr. Scheffler know that his testosterone level was low and we need one more before 10 am.

## 2018-08-01 NOTE — Telephone Encounter (Signed)
Pt informed and scheduled

## 2018-08-03 ENCOUNTER — Other Ambulatory Visit: Payer: BLUE CROSS/BLUE SHIELD

## 2018-08-03 DIAGNOSIS — E349 Endocrine disorder, unspecified: Secondary | ICD-10-CM

## 2018-08-04 ENCOUNTER — Telehealth: Payer: Self-pay

## 2018-08-04 LAB — TESTOSTERONE: Testosterone: 172 ng/dL — ABNORMAL LOW (ref 264–916)

## 2018-08-04 NOTE — Telephone Encounter (Signed)
-----   Message from Nori Riis, PA-C sent at 08/04/2018  7:37 AM EDT ----- Please add a LH, FSH and prolactin to his blood work.

## 2018-08-04 NOTE — Telephone Encounter (Signed)
Called [facility] spoke with customer service spoke with [place], test were added on.

## 2018-08-07 ENCOUNTER — Telehealth: Payer: Self-pay

## 2018-08-07 NOTE — Telephone Encounter (Signed)
Patient notified, he had sleep study done this weekend and is awaiting results. Will call us when he has results

## 2018-08-07 NOTE — Telephone Encounter (Signed)
-----  Message from Shannon A McGowan, PA-C sent at 08/06/2018  4:06 PM EDT ----- Please let Clarence Black know that he has met criteria for testosterone deficiency.  I would like him to have completed his sleep study before he starts testosterone therapy as there is an increase risk of heart attacks within the first six months of treatment and untreated apnea only serves to increase that risk. 

## 2018-08-11 ENCOUNTER — Encounter (INDEPENDENT_AMBULATORY_CARE_PROVIDER_SITE_OTHER): Payer: BLUE CROSS/BLUE SHIELD | Admitting: Ophthalmology

## 2018-08-11 DIAGNOSIS — E113313 Type 2 diabetes mellitus with moderate nonproliferative diabetic retinopathy with macular edema, bilateral: Secondary | ICD-10-CM | POA: Diagnosis not present

## 2018-08-11 DIAGNOSIS — H43813 Vitreous degeneration, bilateral: Secondary | ICD-10-CM

## 2018-08-11 DIAGNOSIS — H35033 Hypertensive retinopathy, bilateral: Secondary | ICD-10-CM | POA: Diagnosis not present

## 2018-08-11 DIAGNOSIS — H2513 Age-related nuclear cataract, bilateral: Secondary | ICD-10-CM

## 2018-08-11 DIAGNOSIS — E11311 Type 2 diabetes mellitus with unspecified diabetic retinopathy with macular edema: Secondary | ICD-10-CM

## 2018-08-11 DIAGNOSIS — I10 Essential (primary) hypertension: Secondary | ICD-10-CM

## 2018-08-13 ENCOUNTER — Other Ambulatory Visit: Payer: Self-pay | Admitting: Family Medicine

## 2018-08-13 NOTE — Telephone Encounter (Signed)
Rx request for 50k vit D received; DENIED See last lab note: "Latisha, please let the patient know that his vitamin D level is quite low; I'd like to put him on some prescription vitamin D once a week for just four weeks, then have him take 1,000 iu of OTC vitamin D3 once a day after he finishes the Rx" Thank you

## 2018-08-14 LAB — FSH/LH
FSH: 6.9 m[IU]/mL (ref 1.5–12.4)
LH: 3.6 m[IU]/mL (ref 1.7–8.6)

## 2018-08-14 LAB — SPECIMEN STATUS REPORT

## 2018-08-14 LAB — PROLACTIN: PROLACTIN: 7.1 ng/mL (ref 4.0–15.2)

## 2018-08-19 ENCOUNTER — Other Ambulatory Visit: Payer: Self-pay | Admitting: Family Medicine

## 2018-09-08 ENCOUNTER — Encounter (INDEPENDENT_AMBULATORY_CARE_PROVIDER_SITE_OTHER): Payer: BLUE CROSS/BLUE SHIELD | Admitting: Ophthalmology

## 2018-09-08 DIAGNOSIS — H2513 Age-related nuclear cataract, bilateral: Secondary | ICD-10-CM

## 2018-09-08 DIAGNOSIS — H35033 Hypertensive retinopathy, bilateral: Secondary | ICD-10-CM | POA: Diagnosis not present

## 2018-09-08 DIAGNOSIS — H43813 Vitreous degeneration, bilateral: Secondary | ICD-10-CM

## 2018-09-08 DIAGNOSIS — E113313 Type 2 diabetes mellitus with moderate nonproliferative diabetic retinopathy with macular edema, bilateral: Secondary | ICD-10-CM | POA: Diagnosis not present

## 2018-09-08 DIAGNOSIS — E11311 Type 2 diabetes mellitus with unspecified diabetic retinopathy with macular edema: Secondary | ICD-10-CM

## 2018-09-08 DIAGNOSIS — I10 Essential (primary) hypertension: Secondary | ICD-10-CM

## 2018-09-13 ENCOUNTER — Telehealth: Payer: Self-pay | Admitting: Urology

## 2018-09-13 NOTE — Telephone Encounter (Signed)
Pt called asking if we received his sleep study.  Sharyn Lull could see this under care everywhere.  Pt had this done at [facility].  Please advise if and when he needs to make a follow up appt.

## 2018-09-14 NOTE — Telephone Encounter (Signed)
I have seen the results of the sleep study which indicates that he has sleep apnea.  Has he had the follow up appointment to discuss the results with his PCP?

## 2018-09-15 NOTE — Telephone Encounter (Signed)
Patient states he is on the sleep apnea machine. He would like prescription for something that will advance his sexual drive.

## 2018-09-21 ENCOUNTER — Other Ambulatory Visit: Payer: Self-pay | Admitting: Family Medicine

## 2018-09-28 ENCOUNTER — Encounter: Payer: BLUE CROSS/BLUE SHIELD | Admitting: Family Medicine

## 2018-09-29 ENCOUNTER — Encounter: Payer: Self-pay | Admitting: Nurse Practitioner

## 2018-09-29 ENCOUNTER — Ambulatory Visit (INDEPENDENT_AMBULATORY_CARE_PROVIDER_SITE_OTHER): Payer: BLUE CROSS/BLUE SHIELD | Admitting: Nurse Practitioner

## 2018-09-29 VITALS — BP 124/78 | HR 98 | Temp 98.0°F | Resp 18 | Ht 70.0 in | Wt 295.1 lb

## 2018-09-29 DIAGNOSIS — IMO0002 Reserved for concepts with insufficient information to code with codable children: Secondary | ICD-10-CM

## 2018-09-29 DIAGNOSIS — G4733 Obstructive sleep apnea (adult) (pediatric): Secondary | ICD-10-CM | POA: Diagnosis not present

## 2018-09-29 DIAGNOSIS — Z23 Encounter for immunization: Secondary | ICD-10-CM

## 2018-09-29 DIAGNOSIS — E785 Hyperlipidemia, unspecified: Secondary | ICD-10-CM

## 2018-09-29 DIAGNOSIS — E1151 Type 2 diabetes mellitus with diabetic peripheral angiopathy without gangrene: Secondary | ICD-10-CM

## 2018-09-29 DIAGNOSIS — Z0000 Encounter for general adult medical examination without abnormal findings: Secondary | ICD-10-CM

## 2018-09-29 DIAGNOSIS — G4730 Sleep apnea, unspecified: Secondary | ICD-10-CM

## 2018-09-29 DIAGNOSIS — E1165 Type 2 diabetes mellitus with hyperglycemia: Secondary | ICD-10-CM

## 2018-09-29 DIAGNOSIS — I10 Essential (primary) hypertension: Secondary | ICD-10-CM

## 2018-09-29 HISTORY — DX: Sleep apnea, unspecified: G47.30

## 2018-09-29 NOTE — Patient Instructions (Addendum)
-   walk dog 4 days a week  - eat more greens - Please take vitamin D 2000 IU a day    General recommendations: 150 minutes of physical activity weekly, eat two servings of fish weekly, eat one serving of tree nuts ( cashews, pistachios, pecans, almonds.Marland Kitchen) every other day, eat 6 servings of fruit/vegetables daily and drink plenty of water and avoid sweet beverages.   Good cholesterol, also called high-density lipoprotein (HDL) removes extra cholesterol and plaque buildup in your arteries and then sends it to your liver to get rid of and helps reduce your risk of heart disease, heart attack, and stroke.Foods that increase HDL: beans and legumes, whole grains, high-fiber fruits:prunes, apples, and pears; fatty fish- salmon, tuna, sardines; nuts, olive oil

## 2018-09-29 NOTE — Progress Notes (Signed)
Name: Clarence Black   MRN: [id number]    DOB: [birth year]   Date:09/29/2018       Progress Note  Subjective  Chief Complaint  Chief Complaint  Patient presents with  . Annual Exam    HPI  Patient presents for annual CPE .  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   Sleep study completed showed moderate sleep apnea and that he would benefit from CPAP; states has been using it; states is not as tired as before and wife says he is not snoring as much.    Fasting blood sugars 120-140's   Depression:  Depression screen [facility] 2/9 09/29/2018 06/23/2018 09/23/2017 01/14/2017 12/31/2016  Decreased Interest 0 0 0 0 0  Down, Depressed, Hopeless 0 0 0 0 0  PHQ - 2 Score 0 0 0 0 0  Altered sleeping 0 - - - -  Tired, decreased energy 0 - - - -  Change in appetite 0 - - - -  Feeling bad or failure about yourself  0 - - - -  Trouble concentrating 0 - - - -  Moving slowly or fidgety/restless 0 - - - -  Suicidal thoughts 0 - - - -  PHQ-9 Score 0 - - - -  Difficult doing work/chores Not difficult at all - - - -    Hypertension:  BP Readings from Last 3 Encounters:  09/29/18 124/78  07/24/18 122/74  06/23/18 136/84   Obesity: Wt Readings from Last 3 Encounters:  09/29/18 295 lb 1.6 oz (133.9 kg)  07/24/18 290 lb (131.5 kg)  06/23/18 297 lb (134.7 kg)   BMI Readings from Last 3 Encounters:  09/29/18 42.34 kg/m  07/24/18 41.61 kg/m  06/23/18 42.62 kg/m     Lipids:  Lab Results  Component Value Date   CHOL 130 06/23/2018   CHOL 99  09/23/2017   CHOL 130 10/22/2016   Lab Results  Component Value Date   HDL 35 (L) 06/23/2018   HDL 35 (L) 09/23/2017   HDL 42 10/22/2016   Lab Results  Component Value Date   LDLCALC 62 06/23/2018   LDLCALC 42 09/23/2017   LDLCALC 62 10/22/2016   Lab Results  Component Value Date   TRIG 282 (H) 06/23/2018   TRIG 134 09/23/2017   TRIG 132 10/22/2016   Lab Results  Component Value Date   CHOLHDL 3.7 06/23/2018   CHOLHDL 2.8 09/23/2017   CHOLHDL 3.1 10/22/2016   No results found for: LDLDIRECT Glucose:  Glucose, Bld  Date Value Ref Range Status  06/23/2018 241 (H) 65 - 139 mg/dL Final    Comment:    .        Non-fasting reference interval .   09/23/2017 116 (H) 65 - 99 mg/dL Final    Comment:    .            Fasting reference interval . For someone without known diabetes, a glucose value between 100 and 125 mg/dL is consistent with prediabetes and should be confirmed with a follow-up test. .  10/22/2016 102 (H) 65 - 99 mg/dL Final   Glucose-Capillary  Date Value Ref Range Status  06/02/2018 173 (H) 65 - 99 mg/dL Final      Office Visit from 09/29/2018 in [facility]  AUDIT-C Score  1       Married; 3 kids  STD testing and prevention (HIV/chl/gon/syphilis): declines  Hep C: declines   Skin cancer: not in the sun a lot Colorectal cancer: last completed in June of this year;one polyp removed, routine 5-10 year surveillance reccomended Prostate cancer: normal levels from PSA 0.37/2019 see objective exam below; followed by [facility]  IPSS Questionnaire (AUA-7): Over the past month.   1)  How often have you had a sensation of not emptying your bladder completely after you finish urinating?  0 - Not at all  2)  How often have you had to urinate again less than two hours after you finished urinating? 1 - Less than 1 time in 5  3)  How often have you found you stopped and started again several times when you urinated?  1 - Less  than 1 time in 5  4) How difficult have you found it to postpone urination?  3 - About half the time  5) How often have you had a weak urinary stream?  0 - Not at all  6) How often have you had to push or strain to begin urination?  0 - Not at all  7) How many times did you most typically get up to urinate from the time you went to bed until the time you got up in the morning?  1 - 1 time  Total score:  0-7 mildly symptomatic   8-19 moderately symptomatic   20-35 severely symptomatic   Lung cancer:  Low Dose CT Chest recommended if Age 50-80 years, 30 pack-year currently smoking OR have quit w/in 15years. Patient does not qualify.    Advanced Care Planning: A voluntary discussion about advance care planning including the explanation and discussion of advance directives.  Discussed health care proxy and Living will, and the patient was able to identify a health care proxy as wife, Bernd Crom .  Patient does not have a living will at present time. If patient does have living will, I have requested they bring this to the clinic to be scanned in to their chart.  Sees eye doctor- monthly Dentist every 6 months.  Patient Active Problem List   Diagnosis Date Noted  . Sleep apnea 09/29/2018  . Preventative health care 09/23/2017  . Umbilical hernia without obstruction and without gangrene 12/31/2016  . Ventral hernia without obstruction or gangrene 12/31/2016  . Low testosterone in male 10/22/2016  . Uncontrolled type 2 diabetes mellitus with peripheral circulatory disorder ([place]) 06/13/2015  . Hyperlipidemia LDL goal <100 06/13/2015  . Hypertension goal BP (blood pressure) < 130/80 06/13/2015  . Recurrent boils 06/13/2015  . Erectile dysfunction associated with type 2 diabetes mellitus ([place]) 02/20/2015  . Obesity, Class III, BMI 40-49.9 (morbid obesity) ([place]) 02/20/2015    Past Surgical History:  Procedure Laterality Date  . COLONOSCOPY  2013  . COLONOSCOPY WITH PROPOFOL N/A 06/02/2018    Procedure: COLONOSCOPY WITH PROPOFOL;  Surgeon: Jonathon Bellows, MD;  [facility] END[facility]COPY;  Service: Gastroenterology;  Laterality: N/A;    Family History  Problem Relation Age of Onset  . Diabetes Mother   . Kidney disease Mother   . Diabetes Brother   . Diabetes Brother   .  Diabetes Maternal Grandfather   . Hypertension Maternal Grandfather   . Stroke Maternal Grandfather     Social History   Socioeconomic History  . Marital status: Married    Spouse name: Jeanett Schlein   . Number of children: 3  . Years of education: Not on file  . Highest education level: Not on file  Occupational History  . Not on file  Social Needs  . Financial resource strain: Not on file  . Food insecurity:    Worry: Not on file    Inability: Not on file  . Transportation needs:    Medical: Not on file    Non-medical: Not on file  Tobacco Use  . Smoking status: Never Smoker  . Smokeless tobacco: Never Used  Substance and Sexual Activity  . Alcohol use: No    Alcohol/week: 0.0 standard drinks  . Drug use: No  . Sexual activity: Yes    Partners: Female  Lifestyle  . Physical activity:    Days per week: 2 days    Minutes per session: 40 min  . Stress: Not at all  Relationships  . Social connections:    Talks on phone: Not on file    Gets together: Not on file    Attends religious service: Not on file    Active member of club or organization: Not on file    Attends meetings of clubs or organizations: Not on file    Relationship status: Not on file  . Intimate partner violence:    Fear of current or ex partner: No    Emotionally abused: No    Physically abused: No    Forced sexual activity: No  Other Topics Concern  . Not on file  Social History Narrative  . Not on file     Current Outpatient Medications:  .  amLODipine (NORVASC) 5 MG tablet, TAKE 1 TABLET (5 MG TOTAL) BY MOUTH DAILY. (FOR BLOOD PRESSURE), Disp: 90 tablet, Rfl: 2 .  aspirin EC 81 MG tablet, Take 1 tablet (81 mg  total) by mouth daily. To help prevent heart attack, Disp: 90 tablet, Rfl: 1 .  atorvastatin (LIPITOR) 40 MG tablet, TAKE 1 TABLET BY MOUTH EVERYDAY AT BEDTIME, Disp: 90 tablet, Rfl: 1 .  B-D UF III MINI PEN NEEDLES 31G X 5 MM MISC, FOR USE WITH INJECTABLE MEDICINE, ONCE A DAY, Disp: 90 each, Rfl: 3 .  BESIVANCE 0.6 % SUSP, PLACE 1 DROP INTO RIGHT EYE 4 TIMES A DAY FOR 2 DAYS AFTER EACH MONTHLY EYE INJECTION, Disp: , Rfl: 12 .  glipiZIDE (GLUCOTROL XL) 10 MG 24 hr tablet, Take 2 tablets (20 mg total) by mouth daily., Disp: , Rfl:  .  glucose blood (ONE TOUCH ULTRA TEST) test strip, Use as instructed, Disp: 300 each, Rfl: 3 .  hydrochlorothiazide (HYDRODIURIL) 25 MG tablet, TAKE 1 TABLET BY MOUTH EVERY DAY, Disp: 90 tablet, Rfl: 1 .  Liraglutide (VICTOZA) 18 MG/3ML SOPN, Inject 0.3 mLs (1.8 mg total) into the skin daily., Disp: 15 pen, Rfl: 1 .  lisinopril (PRINIVIL,ZESTRIL) 40 MG tablet, TAKE 1 TABLET (40 MG TOTAL) BY MOUTH DAILY. (FOR KIDNEY PROTECTION AND BLOOD PRESSURE), Disp: 90 tablet, Rfl: 1 .  Multiple Vitamin (MULTIVITAMIN) tablet, Take 1 tablet by mouth daily., Disp: , Rfl:  .  ONETOUCH DELICA LANCETS FINE MISC, 1 Device by Does not apply route 3 (three) times daily., Disp: 300 each, Rfl: 3 .  prednisoLONE acetate (PRED FORTE) 1 % ophthalmic suspension, INSTILL 1 DROP IN  THE RIGHT EYE 3 TIMES A DAY FOR 5 DAYS THEN DISCONTINUE, Disp: , Rfl: 6 .  sildenafil (REVATIO) 20 MG tablet, Take 3-5 tablets (60-100 mg total) by mouth daily as needed., Disp: 30 tablet, Rfl: 3 .  sulfacetamide (BLEPH-10) 10 % ophthalmic solution, Place 2 drops into the right eye every 3 (three) hours while awake., Disp: 15 mL, Rfl: 0 .  XIGDUO XR 04-999 MG TB24, Take 2 tablets by mouth daily., Disp: , Rfl: 11  No Known Allergies   Review of Systems  Constitutional: Negative for chills, fever and malaise/fatigue.  HENT: Negative for congestion, sinus pain and sore throat.   Eyes: Negative for blurred vision and  double vision.  Respiratory: Negative for cough and shortness of breath.   Cardiovascular: Negative for chest pain, palpitations and leg swelling.  Gastrointestinal: Negative for abdominal pain, constipation, diarrhea and nausea.  Genitourinary: Positive for frequency. Negative for dysuria.  Musculoskeletal: Positive for falls (fell in yard 2 weeks ago ). Negative for joint pain.  Skin: Negative for rash.  Neurological: Negative for dizziness, weakness and headaches.  Endo/Heme/Allergies: Positive for polydipsia. Does not bruise/bleed easily.  Psychiatric/Behavioral: Negative for depression. The patient is not nervous/anxious and does not have insomnia.      Objective  Vitals:   09/29/18 1512  BP: 124/78  Pulse: 98  Resp: 18  Temp: 98 F (36.7 C)  TempSrc: Oral  SpO2: 98%  Weight: 295 lb 1.6 oz (133.9 kg)  Height: 5\' 10"  (1.778 m)    Body mass index is 42.34 kg/m.  Physical Exam Constitutional: Patient appears well-developed and well-nourished. No distress.  HENT: Head: Normocephalic and atraumatic. Ears: B TMs ok, no erythema or effusion; Nose: Nose normal. Mouth/Throat: Oropharynx is clear and moist. No oropharyngeal exudate.  Eyes: Conjunctivae and EOM are normal. Pupils are equal, round, and reactive to light. No scleral icterus.  Neck: Normal range of motion. Neck supple. No JVD present. No thyromegaly present.  Cardiovascular: Normal rate, regular rhythm and normal heart sounds.  No murmur heard. No BLE edema. Pulmonary/Chest: Effort normal and breath sounds normal. No respiratory distress. Abdominal: Soft. Bowel sounds are normal, no distension. There is no tenderness. no masses, umbilical hernia noted MALE GENITALIA: deferred Musculoskeletal: Normal range of motion, no joint effusions. No gross deformities c/o right knee pain from fall with no tenderness or decreased ROM  Neurological: he is alert and oriented to person, place, and time. No cranial nerve deficit.  Coordination, balance, strength, speech and gait are normal.  Skin: Skin is warm and dry. No rash noted. No erythema.  Psychiatric: Patient has a normal mood and affect. behavior is normal. Judgment and thought content normal. Diabetic Foot Exam - Simple   Simple Foot Form Diabetic Foot exam was performed with the following findings:  Yes 09/29/2018  4:12 PM  Visual Inspection No deformities, no ulcerations, no other skin breakdown bilaterally:  Yes Sensation Testing Intact to touch and monofilament testing bilaterally:  Yes Pulse Check Posterior Tibialis and Dorsalis pulse intact bilaterally:  Yes Comments      Recent Results (from the past 2160 hour(s))  PSA     Status: None   Collection Time: 07/24/18  2:07 PM  Result Value Ref Range   Prostate Specific Ag, Serum 0.3 0.0 - 4.0 ng/mL    Comment: Roche ECLIA methodology. According to the American Urological Association, Serum PSA should decrease and remain at undetectable levels after radical prostatectomy. The AUA defines biochemical recurrence as an initial PSA  value 0.2 ng/mL or greater followed by a subsequent confirmatory PSA value 0.2 ng/mL or greater. Values obtained with different assay methods or kits cannot be used interchangeably. Results cannot be interpreted as absolute evidence of the presence or absence of malignant disease.   Testosterone     Status: Abnormal   Collection Time: 07/24/18  2:07 PM  Result Value Ref Range   Testosterone 111 (L) 264 - 916 ng/dL    Comment: Adult male reference interval is based on a population of healthy nonobese males (BMI <30) between 64 and 66 years old. [place], [place] [phone]. PMID: [id number].   Testosterone     Status: Abnormal   Collection Time: 07/31/18  9:34 AM  Result Value Ref Range   Testosterone 178 (L) 264 - 916 ng/dL    Comment: Adult male reference interval is based on a population of healthy nonobese males (BMI <30) between 61 and 26 years  old. [place], [place] [phone]. PMID: [id number].   Testosterone     Status: Abnormal   Collection Time: 08/03/18  8:20 AM  Result Value Ref Range   Testosterone 172 (L) 264 - 916 ng/dL    Comment: Adult male reference interval is based on a population of healthy nonobese males (BMI <30) between 36 and 51 years old. [place], [place] [phone]. PMID: [id number].   FSH/LH     Status: None   Collection Time: 08/03/18  8:20 AM  Result Value Ref Range   LH 3.6 1.7 - 8.6 mIU/mL   FSH 6.9 1.5 - 12.4 mIU/mL  Prolactin     Status: None   Collection Time: 08/03/18  8:20 AM  Result Value Ref Range   Prolactin 7.1 4.0 - 15.2 ng/mL  Specimen status report     Status: None   Collection Time: 08/03/18  8:20 AM  Result Value Ref Range   specimen status report Comment     Comment: Written Authorization Written Authorization Written Authorization Received. Authorization received from [place] 08-14-2018 Logged by Adah Salvage      PHQ2/9: Depression screen [facility] 2/9 09/29/2018 06/23/2018 09/23/2017 01/14/2017 12/31/2016  Decreased Interest 0 0 0 0 0  Down, Depressed, Hopeless 0 0 0 0 0  PHQ - 2 Score 0 0 0 0 0  Altered sleeping 0 - - - -  Tired, decreased energy 0 - - - -  Change in appetite 0 - - - -  Feeling bad or failure about yourself  0 - - - -  Trouble concentrating 0 - - - -  Moving slowly or fidgety/restless 0 - - - -  Suicidal thoughts 0 - - - -  PHQ-9 Score 0 - - - -  Difficult doing work/chores Not difficult at all - - - -     Fall Risk: Fall Risk  09/29/2018 06/23/2018 09/23/2017 01/14/2017 12/31/2016  Falls in the past year? No No No No No      Assessment & Plan  1. Routine general medical examination at a health care facility Discussed healthy living and created goals  2. Obstructive sleep apnea syndrome Wears CPAP   3. Need for pneumococcal vaccination - Pneumococcal polysaccharide vaccine 23-valent greater than or equal to 2yo  subcutaneous/IM  4. Need for influenza vaccination - Flu Vaccine QUAD 6+ mos PF IM (Fluarix Quad PF)  5. Obesity, Class III, BMI 40-49.9 (morbid obesity) ([place]) Goal to walk dogs 4 days a week, include more vegetables in diet, plan ahead for healthy snacks - Amb  ref to Medical Nutrition Therapy-MNT  6. Uncontrolled type 2 diabetes mellitus with peripheral circulatory disorder ([place]) Follows up with Dr. Gabriel Carina  - Amb ref to Medical Nutrition Therapy-MNT  7. Hyperlipidemia LDL goal <100 LDL at goal, would like to work harder on weight loss and recheck labs at 3 month follow-up   8. Hypertension goal BP (blood pressure) < 130/80 At goal today.   -Prostate cancer screening and PSA options (with potential risks and benefits of testing vs not testing) were discussed along with recent recs/guidelines. -USPSTF grade A and B recommendations reviewed with patient; age-appropriate recommendations, preventive care, screening tests, etc discussed and encouraged; healthy living encouraged; see AVS for patient education given to patient -Discussed importance of 150 minutes of physical activity weekly, eat two servings of fish weekly, eat one serving of tree nuts ( cashews, pistachios, pecans, almonds.Marland Kitchen) every other day, eat 6 servings of fruit/vegetables daily and drink plenty of water and avoid sweet beverages.

## 2018-09-30 ENCOUNTER — Other Ambulatory Visit: Payer: Self-pay | Admitting: Family Medicine

## 2018-10-01 NOTE — Telephone Encounter (Signed)
Rx Vit D requested; denied Please contact patient and read him the instructions from the June vitamin D results; thank you

## 2018-10-02 NOTE — Telephone Encounter (Signed)
Pt.notified

## 2018-10-04 ENCOUNTER — Encounter (INDEPENDENT_AMBULATORY_CARE_PROVIDER_SITE_OTHER): Payer: BLUE CROSS/BLUE SHIELD | Admitting: Ophthalmology

## 2018-10-04 DIAGNOSIS — E11311 Type 2 diabetes mellitus with unspecified diabetic retinopathy with macular edema: Secondary | ICD-10-CM

## 2018-10-04 DIAGNOSIS — E113313 Type 2 diabetes mellitus with moderate nonproliferative diabetic retinopathy with macular edema, bilateral: Secondary | ICD-10-CM | POA: Diagnosis not present

## 2018-10-04 DIAGNOSIS — H43813 Vitreous degeneration, bilateral: Secondary | ICD-10-CM

## 2018-10-04 DIAGNOSIS — H35033 Hypertensive retinopathy, bilateral: Secondary | ICD-10-CM | POA: Diagnosis not present

## 2018-10-04 DIAGNOSIS — H2513 Age-related nuclear cataract, bilateral: Secondary | ICD-10-CM

## 2018-10-04 DIAGNOSIS — I10 Essential (primary) hypertension: Secondary | ICD-10-CM | POA: Diagnosis not present

## 2018-10-13 LAB — HEMOGLOBIN A1C: Hemoglobin A1C: 8.6

## 2018-11-01 ENCOUNTER — Encounter (INDEPENDENT_AMBULATORY_CARE_PROVIDER_SITE_OTHER): Payer: BLUE CROSS/BLUE SHIELD | Admitting: Ophthalmology

## 2018-11-08 NOTE — Telephone Encounter (Signed)
Pt states you mentioned treating both his testosterone and his erection issues. He states you had mentioned a couple of different options for penile injections. He wants to know what you would recommend for this issues. Please advise.

## 2018-11-08 NOTE — Telephone Encounter (Signed)
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  One thing to keep in mind is treating his testosterone deficiency may or may not address his erectile dysfunction.  If he is mainly interested in achieving erections, I would suggest EDEX.  I have samples of the [place], so he can make a morning appointment for those injections.

## 2018-11-08 NOTE — Telephone Encounter (Signed)
He is speaking of testosterone therapy?  He will need to contact his insurance company to see what treatment options are covered by his insurance company and get back to [place].

## 2018-11-08 NOTE — Telephone Encounter (Signed)
Left message for pt to call our office to discuss.

## 2018-11-09 NOTE — Telephone Encounter (Signed)
Patient notified and appointment made

## 2018-11-10 ENCOUNTER — Encounter (INDEPENDENT_AMBULATORY_CARE_PROVIDER_SITE_OTHER): Payer: BLUE CROSS/BLUE SHIELD | Admitting: Ophthalmology

## 2018-11-10 DIAGNOSIS — I10 Essential (primary) hypertension: Secondary | ICD-10-CM | POA: Diagnosis not present

## 2018-11-10 DIAGNOSIS — E11311 Type 2 diabetes mellitus with unspecified diabetic retinopathy with macular edema: Secondary | ICD-10-CM | POA: Diagnosis not present

## 2018-11-10 DIAGNOSIS — H35033 Hypertensive retinopathy, bilateral: Secondary | ICD-10-CM

## 2018-11-10 DIAGNOSIS — H43813 Vitreous degeneration, bilateral: Secondary | ICD-10-CM

## 2018-11-10 DIAGNOSIS — H2513 Age-related nuclear cataract, bilateral: Secondary | ICD-10-CM

## 2018-11-10 DIAGNOSIS — E113313 Type 2 diabetes mellitus with moderate nonproliferative diabetic retinopathy with macular edema, bilateral: Secondary | ICD-10-CM | POA: Diagnosis not present

## 2018-11-17 ENCOUNTER — Telehealth: Payer: Self-pay | Admitting: Urology

## 2018-11-17 NOTE — Telephone Encounter (Signed)
Pt called asking if meds would be covered by insurance.  Please call pt.

## 2018-11-20 NOTE — Telephone Encounter (Signed)
Spoke with pt and per him he thought someone from our office was going to call his insurance and see if the Edex was covered before he came in for his appt. I apologized to the pt and informed him I do not see that noted but informed him to call his insurance and then call us back with information. He understood and had no additional questions at this time.

## 2018-11-21 ENCOUNTER — Ambulatory Visit: Payer: Self-pay | Admitting: Urology

## 2018-12-07 ENCOUNTER — Encounter (INDEPENDENT_AMBULATORY_CARE_PROVIDER_SITE_OTHER): Payer: BLUE CROSS/BLUE SHIELD | Admitting: Ophthalmology

## 2018-12-08 ENCOUNTER — Encounter (INDEPENDENT_AMBULATORY_CARE_PROVIDER_SITE_OTHER): Payer: BLUE CROSS/BLUE SHIELD | Admitting: Ophthalmology

## 2018-12-08 DIAGNOSIS — H35033 Hypertensive retinopathy, bilateral: Secondary | ICD-10-CM | POA: Diagnosis not present

## 2018-12-08 DIAGNOSIS — H2513 Age-related nuclear cataract, bilateral: Secondary | ICD-10-CM

## 2018-12-08 DIAGNOSIS — E11311 Type 2 diabetes mellitus with unspecified diabetic retinopathy with macular edema: Secondary | ICD-10-CM

## 2018-12-08 DIAGNOSIS — E113313 Type 2 diabetes mellitus with moderate nonproliferative diabetic retinopathy with macular edema, bilateral: Secondary | ICD-10-CM | POA: Diagnosis not present

## 2018-12-08 DIAGNOSIS — I10 Essential (primary) hypertension: Secondary | ICD-10-CM

## 2018-12-08 DIAGNOSIS — H43813 Vitreous degeneration, bilateral: Secondary | ICD-10-CM

## 2018-12-14 ENCOUNTER — Encounter: Payer: Self-pay | Admitting: Urology

## 2018-12-14 ENCOUNTER — Ambulatory Visit (INDEPENDENT_AMBULATORY_CARE_PROVIDER_SITE_OTHER): Payer: BLUE CROSS/BLUE SHIELD | Admitting: Urology

## 2018-12-14 VITALS — BP 146/79 | HR 98 | Ht 70.0 in | Wt 299.1 lb

## 2018-12-14 DIAGNOSIS — N529 Male erectile dysfunction, unspecified: Secondary | ICD-10-CM

## 2018-12-14 NOTE — Patient Instructions (Signed)
Please inject 0.75 into your penis.  This would be pushing the plunger to the 0.25 on the syringe.

## 2018-12-14 NOTE — Progress Notes (Signed)
12/14/2018 11:17 AM   [name] 1966-10-20 [id number]  Referring provider: Arnetha Courser, MD [address] [place], [place] [address]  Chief Complaint  Patient presents with  . Follow-up    HPI: Patient is a 52 year old African American male with a history of testosterone deficiency, ED and BPH with LU TS who presents today to reestablish urological care.  Testosterone deficiency He is having a decrease in libido and would like to know what could help with his desire. He has a hx of obesity and type 2 DM.    Erectile dysfunction His previous SHIM score was 10, which is moderate ED.  He notes that when he takes PDE5 inhibitors, he has some stimulation, however, his erections are not as stiff. He rates his stiffness as a 6/10 and it lasts for minutes. He denies a painful erection. He has a slightly curved erection. He denies PMHx of sickle cell disease or multiple myeloma. He denies taking trazodone.   PMH: Past Medical History:  Diagnosis Date  . Diabetes mellitus without complication ([place])   . Diabetic retinopathy ([place])   . Diastasis recti 12/31/2016  . Erectile dysfunction   . Erectile dysfunction associated with type 2 diabetes mellitus ([place]) 02/20/2015  . 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 Hyperlipidemia LDL goal <100 06/13/2015  . Hypertension   . Hypertension goal BP (blood pressure) < 130/80 06/13/2015  . Hypogonadism, male   . Obesity, Class III, BMI 40-49.9 (morbid obesity) ([place]) 02/20/2015  . Sleep apnea 09/29/2018  . Type 2 diabetes mellitus ([place]) 10/01/2015  . Uncontrolled type 2 diabetes mellitus with peripheral circulatory disorder ([place]) 06/13/2015    Surgical History: Past Surgical History:  Procedure Laterality Date  . COLONOSCOPY  2013  . COLONOSCOPY WITH PROPOFOL N/A 06/02/2018   Procedure: COLONOSCOPY WITH PROPOFOL;  Surgeon: Jonathon Bellows, MD;  Location: [facility] ENDOSCOPY;  Service: Gastroenterology;  Laterality: N/A;    Home Medications:    Allergies as of 12/14/2018   No Known Allergies     Medication List       Accurate as of December 14, 2018 11:17 AM. Always use your most recent med list.        amLODipine 5 MG tablet Commonly known as:  NORVASC TAKE 1 TABLET (5 MG TOTAL) BY MOUTH DAILY. (FOR BLOOD PRESSURE)   aspirin EC 81 MG tablet Take 1 tablet (81 mg total) by mouth daily. To help prevent heart attack   atorvastatin 40 MG tablet Commonly known as:  LIPITOR TAKE 1 TABLET BY MOUTH EVERYDAY AT BEDTIME   B-D UF III MINI PEN NEEDLES 31G X 5 MM Misc Generic drug:  Insulin Pen Needle FOR USE WITH INJECTABLE MEDICINE, ONCE A DAY   BESIVANCE 0.6 % Susp Generic drug:  Besifloxacin HCl PLACE 1 DROP INTO RIGHT EYE 4 TIMES A DAY FOR 2 DAYS AFTER EACH MONTHLY EYE INJECTION   glipiZIDE 10 MG 24 hr tablet Commonly known as:  GLUCOTROL XL Take 2 tablets (20 mg total) by mouth daily.   glucose blood test strip Commonly known as:  ONE TOUCH ULTRA TEST Use as instructed   hydrochlorothiazide 25 MG tablet Commonly known as:  HYDRODIURIL TAKE 1 TABLET BY MOUTH EVERY DAY   liraglutide 18 MG/3ML Sopn Commonly known as:  VICTOZA Inject 0.3 mLs (1.8 mg total) into the skin daily.   lisinopril 40 MG tablet Commonly known as:  PRINIVIL,ZESTRIL TAKE 1 TABLET (40 MG TOTAL) BY MOUTH DAILY. (FOR KIDNEY PROTECTION  AND BLOOD PRESSURE)   multivitamin tablet Take 1 tablet by mouth daily.   ONETOUCH DELICA LANCETS FINE Misc 1 Device by Does not apply route 3 (three) times daily.   prednisoLONE acetate 1 % ophthalmic suspension Commonly known as:  PRED FORTE INSTILL 1 DROP IN THE RIGHT EYE 3 TIMES A DAY FOR 5 DAYS THEN DISCONTINUE   sildenafil 20 MG tablet Commonly known as:  REVATIO Take 3-5 tablets (60-100 mg total) by mouth daily as needed.   sulfacetamide 10 % ophthalmic solution Commonly known as:  BLEPH-10 Place 2 drops into the right eye every 3 (three) hours while awake.   XIGDUO XR 04-999 MG  Tb24 Generic drug:  Dapagliflozin-metFORMIN HCl ER Take 2 tablets by mouth daily.       Allergies: No Known Allergies  Family History: Family History  Problem Relation Age of Onset  . Diabetes Mother   . Kidney disease Mother   . Diabetes Brother   . Diabetes Brother   . Diabetes Maternal Grandfather   . Hypertension Maternal Grandfather   . Stroke Maternal Grandfather     Social History:  reports that he has never smoked. He has never used smokeless tobacco. He reports that he does not drink alcohol or use drugs.  ROS: UROLOGY Frequent Urination?: No Hard to postpone urination?: No Burning/pain with urination?: No Get up at night to urinate?: No Leakage of urine?: No Urine stream starts and stops?: No Trouble starting stream?: No Do you have to strain to urinate?: No Blood in urine?: No Urinary tract infection?: No Sexually transmitted disease?: No Injury to kidneys or bladder?: No Painful intercourse?: No Weak stream?: No Erection problems?: Yes Penile pain?: No  Gastrointestinal Nausea?: No Vomiting?: No Indigestion/heartburn?: No Diarrhea?: No Constipation?: No  Constitutional Fever: No Night sweats?: No Weight loss?: No Fatigue?: No  Skin Skin rash/lesions?: No Itching?: No  Eyes Blurred vision?: No Double vision?: No  Ears/Nose/Throat Sore throat?: No Sinus problems?: No  Hematologic/Lymphatic Swollen glands?: No Easy bruising?: No  Cardiovascular Leg swelling?: No Chest pain?: No  Respiratory Cough?: No Shortness of breath?: No  Endocrine Excessive thirst?: No  Musculoskeletal Back pain?: No Joint pain?: No  Neurological Headaches?: No Dizziness?: No  Psychologic Depression?: No Anxiety?: No  Physical Exam: BP (!) 146/79 (BP Location: Left Arm, Patient Position: Sitting, Cuff Size: Large)   Pulse 98   Ht '5\' 10"'$  (1.778 m)   Wt 299 lb 1.6 oz (135.7 kg)   BMI 42.92 kg/m   Constitutional:  Well nourished. Alert  and oriented, No acute distress. GU: No CVA tenderness.  No bladder fullness or masses.  Patient with circumcised phallus.  Urethral meatus is patent.  No penile discharge. No penile lesions or rashes.  Psychiatric: Normal mood and affect.   Procedure  Patient's left corpus cavernosum is identified.  An area near the base of the penis is cleansed with rubbing alcohol.  Careful to avoid the dorsal vein, 10 mcg of EDEX Lot # [id number] exp date 06/2020. 0.5 mcg is injected at a 90 degree angle into the left corpus cavernosum near the base of the penis.  Patient experienced a moderately firm erection in 15 minutes.    Laboratory Data: Lab Results  Component Value Date   WBC 7.7 06/23/2018   HGB 15.7 06/23/2018   HCT 46.5 06/23/2018   MCV 83.9 06/23/2018   PLT 176 06/23/2018    Lab Results  Component Value Date   CREATININE 1.32 06/23/2018  Lab Results  Component Value Date   PSA 0.3ng/dL 05/27/2015  PSA   0.31 in 08/2014  Lab Results  Component Value Date   TESTOSTERONE 172 (L) 08/03/2018    Lab Results  Component Value Date   HGBA1C 8.4 06/05/2018    Lab Results  Component Value Date   TSH 1.51 06/23/2018       Component Value Date/Time   CHOL 130 06/23/2018 1532   CHOL 205 (H) 12/16/2015 1038   HDL 35 (L) 06/23/2018 1532   HDL 36 (L) 12/16/2015 1038   CHOLHDL 3.7 06/23/2018 1532   VLDL 26 10/22/2016 1716   LDLCALC 62 06/23/2018 1532    Lab Results  Component Value Date   AST 27 06/23/2018   Lab Results  Component Value Date   ALT 22 06/23/2018   No components found for: ALKALINEPHOPHATASE No components found for: BILIRUBINTOTAL  No results found for: ESTRADIOL  Urinalysis No results found for: COLORURINE, APPEARANCEUR, LABSPEC, PHURINE, GLUCOSEU, HGBUR, BILIRUBINUR, KETONESUR, PROTEINUR, UROBILINOGEN, NITRITE, LEUKOCYTESUR  I have reviewed the labs.   Assessment & Plan:    1. Testosterone deficiency  Discussed with the patient that we will need  to obtain one more morning testosterone level to determine that the testosterone levels are low.  Discussed with the patient that he will need to follow up for a morning testosterone level before 9 AM. Patient will call into the office to arrange this pending his erections with EDEX.    2. Erectile dysfunction  Previous SHIM score is 10 He experienced a moderate erection with 0.5 mcg with EDEX 10 mcg He would like to continue the titration at home and I have given him the other sample to do so Advised patient of the condition of priapism, painful erection lasting for more than four hours, and to contact the office immediately or seek treatment in the ED    No follow-ups on file.  These notes generated with voice recognition software. I apologize for typographical errors.  [place], [place] [address] [phone]  I, Adele Schilder, am acting as a Education administrator for [employer], PA-C.   I have reviewed the above documentation for accuracy and completeness, and I agree with the above.    Zara Council, PA-C

## 2018-12-18 ENCOUNTER — Telehealth: Payer: Self-pay | Admitting: Urology

## 2018-12-18 NOTE — Telephone Encounter (Signed)
I spoke with Clarence Black regarding his EDEX injections.  He has no questions.  He has not had an opportunity to inject himself at home as he had a family emergency.

## 2018-12-22 ENCOUNTER — Ambulatory Visit: Payer: BLUE CROSS/BLUE SHIELD | Admitting: Family Medicine

## 2019-01-05 ENCOUNTER — Encounter (INDEPENDENT_AMBULATORY_CARE_PROVIDER_SITE_OTHER): Payer: BLUE CROSS/BLUE SHIELD | Admitting: Ophthalmology

## 2019-01-12 ENCOUNTER — Encounter (INDEPENDENT_AMBULATORY_CARE_PROVIDER_SITE_OTHER): Payer: BLUE CROSS/BLUE SHIELD | Admitting: Ophthalmology

## 2019-01-12 DIAGNOSIS — I10 Essential (primary) hypertension: Secondary | ICD-10-CM

## 2019-01-12 DIAGNOSIS — H2513 Age-related nuclear cataract, bilateral: Secondary | ICD-10-CM

## 2019-01-12 DIAGNOSIS — H35033 Hypertensive retinopathy, bilateral: Secondary | ICD-10-CM

## 2019-01-12 DIAGNOSIS — E113313 Type 2 diabetes mellitus with moderate nonproliferative diabetic retinopathy with macular edema, bilateral: Secondary | ICD-10-CM | POA: Diagnosis not present

## 2019-01-12 DIAGNOSIS — E11311 Type 2 diabetes mellitus with unspecified diabetic retinopathy with macular edema: Secondary | ICD-10-CM

## 2019-01-12 DIAGNOSIS — H43813 Vitreous degeneration, bilateral: Secondary | ICD-10-CM

## 2019-01-17 ENCOUNTER — Other Ambulatory Visit: Payer: Self-pay | Admitting: Family Medicine

## 2019-01-20 ENCOUNTER — Telehealth: Payer: Self-pay | Admitting: Family Medicine

## 2019-01-22 NOTE — Telephone Encounter (Signed)
Patient is due for a visit and labs please I refilled the ACE-inhibitor for one month, but we really want to see him in the next week or two Thank you

## 2019-01-23 NOTE — Telephone Encounter (Signed)
appt made for 2.3.2020 with Dr Sanda Klein

## 2019-01-29 ENCOUNTER — Encounter: Payer: Self-pay | Admitting: Family Medicine

## 2019-01-29 ENCOUNTER — Ambulatory Visit: Payer: BLUE CROSS/BLUE SHIELD | Admitting: Family Medicine

## 2019-01-29 VITALS — BP 130/88 | HR 84 | Temp 98.2°F | Ht 70.0 in | Wt 297.9 lb

## 2019-01-29 DIAGNOSIS — E1151 Type 2 diabetes mellitus with diabetic peripheral angiopathy without gangrene: Secondary | ICD-10-CM | POA: Diagnosis not present

## 2019-01-29 DIAGNOSIS — IMO0002 Reserved for concepts with insufficient information to code with codable children: Secondary | ICD-10-CM

## 2019-01-29 DIAGNOSIS — E1165 Type 2 diabetes mellitus with hyperglycemia: Secondary | ICD-10-CM

## 2019-01-29 DIAGNOSIS — L609 Nail disorder, unspecified: Secondary | ICD-10-CM

## 2019-01-29 DIAGNOSIS — I10 Essential (primary) hypertension: Secondary | ICD-10-CM | POA: Diagnosis not present

## 2019-01-29 DIAGNOSIS — R7989 Other specified abnormal findings of blood chemistry: Secondary | ICD-10-CM

## 2019-01-29 DIAGNOSIS — G4733 Obstructive sleep apnea (adult) (pediatric): Secondary | ICD-10-CM

## 2019-01-29 DIAGNOSIS — E559 Vitamin D deficiency, unspecified: Secondary | ICD-10-CM | POA: Insufficient documentation

## 2019-01-29 DIAGNOSIS — Z5181 Encounter for therapeutic drug level monitoring: Secondary | ICD-10-CM | POA: Insufficient documentation

## 2019-01-29 DIAGNOSIS — E785 Hyperlipidemia, unspecified: Secondary | ICD-10-CM | POA: Diagnosis not present

## 2019-01-29 NOTE — Assessment & Plan Note (Signed)
Encouraged weight loss which should help T; he politely declined nutritionist referral but he is welcome to call if changes mind

## 2019-01-29 NOTE — Patient Instructions (Addendum)
Try to follow the DASH guidelines (DASH stands for Dietary Approaches to Stop Hypertension). Try to limit the sodium in your diet to no more than 1,500mg  of sodium per day. Certainly try to not exceed 2,000 mg per day at the very most. Do not add salt when cooking or at the table.  Check the sodium amount on labels when shopping, and choose items lower in sodium when given a choice. Avoid or limit foods that already contain a lot of sodium. Eat a diet rich in fruits and vegetables and whole grains, and try to lose weight if overweight or obese  Aim for 15 pounds (or more) of weight loss before I see you again  Check out the information at familydoctor.org entitled "Nutrition for Weight Loss: What You Need to Know about Fad Diets" Try to lose between 1-2 pounds per week by taking in fewer calories and burning off more calories You can succeed by limiting portions, limiting foods dense in calories and fat, becoming more active, and drinking 8 glasses of water a day (64 ounces) Don't skip meals, especially breakfast, as skipping meals may alter your metabolism Do not use over-the-counter weight loss pills or gimmicks that claim rapid weight loss A healthy BMI (or body mass index) is between 18.5 and 24.9 You can calculate your ideal BMI at the [place] website ClubMonetize.fr  Obesity, Adult Obesity is the condition of having too much total body fat. Being overweight or obese means that your weight is greater than what is considered healthy for your body size. Obesity is determined by a measurement called BMI. BMI is an estimate of body fat and is calculated from height and weight. For adults, a BMI of 30 or higher is considered obese. Obesity can eventually lead to other health concerns and major illnesses, including:  Stroke.  Coronary artery disease (CAD).  Type 2 diabetes.  Some types of cancer, including cancers of the colon, breast, uterus, and  gallbladder.  Osteoarthritis.  High blood pressure (hypertension).  High cholesterol.  Sleep apnea.  Gallbladder stones.  Infertility problems. What are the causes? The main cause of obesity is taking in (consuming) more calories than your body uses for energy. Other factors that contribute to this condition may include:  Being born with genes that make you more likely to become obese.  Having a medical condition that causes obesity. These conditions include: ? Hypothyroidism. ? Polycystic ovarian syndrome (PCOS). ? Binge-eating disorder. ? Cushing syndrome.  Taking certain medicines, such as steroids, antidepressants, and seizure medicines.  Not being physically active (sedentary lifestyle).  Living where there are limited places to exercise safely or buy healthy foods.  Not getting enough sleep. What increases the risk? The following factors may increase your risk of this condition:  Having a family history of obesity.  Being a woman of African-American descent.  Being a man of Hispanic descent. What are the signs or symptoms? Having excessive body fat is the main symptom of this condition. How is this diagnosed? This condition may be diagnosed based on:  Your symptoms.  Your medical history.  A physical exam. Your health care provider may measure: ? Your BMI. If you are an adult with a BMI between 25 and less than 30, you are considered overweight. If you are an adult with a BMI of 30 or higher, you are considered obese. ? The distances around your hips and your waist (circumferences). These may be compared to each other to help diagnose your condition. ? Your  skinfold thickness. Your health care provider may gently pinch a fold of your skin and measure it. How is this treated? Treatment for this condition often includes changing your lifestyle. Treatment may include some or all of the following:  Dietary changes. Work with your health care provider and a  dietitian to set a weight-loss goal that is healthy and reasonable for you. Dietary changes may include eating: ? Smaller portions. A portion size is the amount of a particular food that is healthy for you to eat at one time. This varies from person to person. ? Low-calorie or low-fat options. ? More whole grains, fruits, and vegetables.  Regular physical activity. This may include aerobic activity (cardio) and strength training.  Medicine to help you lose weight. Your health care provider may prescribe medicine if you are unable to lose 1 pound a week after 6 weeks of eating more healthily and doing more physical activity.  Surgery. Surgical options may include gastric banding and gastric bypass. Surgery may be done if: ? Other treatments have not helped to improve your condition. ? You have a BMI of 40 or higher. ? You have life-threatening health problems related to obesity. Follow these instructions at home:  Eating and drinking   Follow recommendations from your health care provider about what you eat and drink. Your health care provider may advise you to: ? Limit fast foods, sweets, and processed snack foods. ? Choose low-fat options, such as low-fat milk instead of whole milk. ? Eat 5 or more servings of fruits or vegetables every day. ? Eat at home more often. This gives you more control over what you eat. ? Choose healthy foods when you eat out. ? Learn what a healthy portion size is. ? Keep low-fat snacks on hand. ? Avoid sugary drinks, such as soda, fruit juice, iced tea sweetened with sugar, and flavored milk. ? Eat a healthy breakfast.  Drink enough water to keep your urine clear or pale yellow.  Do not go without eating for long periods of time (do not fast) or follow a fad diet. Fasting and fad diets can be unhealthy and even dangerous. Physical Activity  Exercise regularly, as told by your health care provider. Ask your health care provider what types of exercise are  safe for you and how often you should exercise.  Warm up and stretch before being active.  Cool down and stretch after being active.  Rest between periods of activity. Lifestyle  Limit the time that you spend in front of your TV, computer, or video game system.  Find ways to reward yourself that do not involve food.  Limit alcohol intake to no more than 1 drink a day for nonpregnant women and 2 drinks a day for men. One drink equals 12 oz of beer, 5 oz of wine, or 1 oz of hard liquor. General instructions  Keep a weight loss journal to keep track of the food you eat and how much you exercise you get.  Take over-the-counter and prescription medicines only as told by your health care provider.  Take vitamins and supplements only as told by your health care provider.  Consider joining a support group. Your health care provider may be able to recommend a support group.  Keep all follow-up visits as told by your health care provider. This is important. Contact a health care provider if:  You are unable to meet your weight loss goal after 6 weeks of dietary and lifestyle changes. This information is  not intended to replace advice given to you by your health care provider. Make sure you discuss any questions you have with your health care provider. Document Released: 01/20/2005 Document Revised: 05/17/2016 Document Reviewed: 10/01/2015 Elsevier Interactive Patient Education  2019 Elsevier Inc.  Preventing Unhealthy [employer], Adult Staying at a healthy weight is important to your overall health. When fat builds up in your body, you may become overweight or obese. Being overweight or obese increases your risk of developing certain health problems, such as heart disease, diabetes, sleeping problems, joint problems, and some types of cancer. Unhealthy weight gain is often the result of making unhealthy food choices or not getting enough exercise. You can make changes to your lifestyle to  prevent obesity and stay as healthy as possible. What nutrition changes can be made?   Eat only as much as your body needs. To do this: ? Pay attention to signs that you are hungry or full. Stop eating as soon as you feel full. ? If you feel hungry, try drinking water first before eating. Drink enough water so your urine is clear or pale yellow. ? Eat smaller portions. Pay attention to portion sizes when eating out. ? Look at serving sizes on food labels. Most foods contain more than one serving per container. ? Eat the recommended number of calories for your gender and activity level. For most active people, a daily total of 2,000 calories is appropriate. If you are trying to lose weight or are not very active, you may need to eat fewer calories. Talk with your health care provider or a diet and nutrition specialist (dietitian) about how many calories you need each day.  Choose healthy foods, such as: ? Fruits and vegetables. At each meal, try to fill at least half of your plate with fruits and vegetables. ? Whole grains, such as whole-wheat bread, brown rice, and quinoa. ? Lean meats, such as chicken or fish. ? Other healthy proteins, such as beans, eggs, or tofu. ? Healthy fats, such as nuts, seeds, fatty fish, and olive oil. ? Low-fat or fat-free dairy products.  Check food labels, and avoid food and drinks that: ? Are high in calories. ? Have added sugar. ? Are high in sodium. ? Have saturated fats or trans fats.  Cook foods in healthier ways, such as by baking, broiling, or grilling.  Make a meal plan for the week, and shop with a grocery list to help you stay on track with your purchases. Try to avoid going to the grocery store when you are hungry.  When grocery shopping, try to shop around the outside of the store first, where the fresh foods are. Doing this helps you to avoid prepackaged foods, which can be high in sugar, salt (sodium), and fat. What lifestyle changes can be  made?   Exercise for 30 or more minutes on 5 or more days each week. Exercising may include brisk walking, yard work, biking, running, swimming, and team sports like basketball and soccer. Ask your health care provider which exercises are safe for you.  Do muscle-strengthening activities, such as lifting weights or using resistance bands, on 2 or more days a week.  Do not use any products that contain nicotine or tobacco, such as cigarettes and e-cigarettes. If you need help quitting, ask your health care provider.  Limit alcohol intake to no more than 1 drink a day for nonpregnant women and 2 drinks a day for men. One drink equals 12 oz of beer,  5 oz of wine, or 1 oz of hard liquor.  Try to get 7-9 hours of sleep each night. What other changes can be made?  Keep a food and activity journal to keep track of: ? What you ate and how many calories you had. Remember to count the calories in sauces, dressings, and side dishes. ? Whether you were active, and what exercises you did. ? Your calorie, weight, and activity goals.  Check your weight regularly. Track any changes. If you notice you have gained weight, make changes to your diet or activity routine.  Avoid taking weight-loss medicines or supplements. Talk to your health care provider before starting any new medicine or supplement.  Talk to your health care provider before trying any new diet or exercise plan. Why are these changes important? Eating healthy, staying active, and having healthy habits can help you to prevent obesity. Those changes also:  Help you manage stress and emotions.  Help you connect with friends and family.  Improve your self-esteem.  Improve your sleep.  Prevent long-term health problems. What can happen if changes are not made? Being obese or overweight can cause you to develop joint or bone problems, which can make it hard for you to stay active or do activities you enjoy. Being obese or overweight  also puts stress on your heart and lungs and can lead to health problems like diabetes, heart disease, and some cancers. Where to find more information Talk with your health care provider or a dietitian about healthy eating and healthy lifestyle choices. You may also find information from:  U.S. Department of Agriculture, MyPlate: FormerBoss.no  American Heart Association: www.heart.org  Centers for Disease Control and Prevention: http://www.wolf.info/ Summary  Staying at a healthy weight is important to your overall health. It helps you to prevent certain diseases and health problems, such as heart disease, diabetes, joint problems, sleep disorders, and some types of cancer.  Being obese or overweight can cause you to develop joint or bone problems, which can make it hard for you to stay active or do activities you enjoy.  You can prevent unhealthy weight gain by eating a healthy diet, exercising regularly, not smoking, limiting alcohol, and getting enough sleep.  Talk with your health care provider or a dietitian for guidance about healthy eating and healthy lifestyle choices. This information is not intended to replace advice given to you by your health care provider. Make sure you discuss any questions you have with your health care provider. Document Released: 12/14/2016 Document Revised: 09/23/2017 Document Reviewed: 01/19/2017 Elsevier Interactive Patient Education  2019 [employer].

## 2019-01-29 NOTE — Progress Notes (Signed)
BP 130/88   Pulse 84   Temp 98.2 F (36.8 C)   Ht 5\' 10"  (1.778 m)   Wt 297 lb 14.4 oz (135.1 kg)   SpO2 98%   BMI 42.74 kg/m    Subjective:    Patient ID: Clarence Black, male    DOB: 1966/06/15, 53 y.o.   MRN: [id number]  HPI: Clarence Black is a 53 y.o. male  Chief Complaint  Patient presents with  . Follow-up    Left big toe  . Medication Refill    HPI Patient is here for f/u  Type 2 diabetes; seeing endocrinologist Last A1c 8.9; considering an injectable that will let him keep driving  He has an issue with his left great toe; dark under the nail; acting like an ingrown toenail and snipped it along the side and that relieved the pressure; no redness or infection; no pain now; saw foot doctor a few years ago; had partial matrixectomy on the left great toenail; curved; nail is quite dark  HTN; not checking away from doctor with my blessing; adds salt to most food; some processed foods; some fast food  Obesity; "I can do better"; he will try limit fast food and carbs Walking 10k a day 4-5 days a week Good water drinker  Seeing urologist for low T; last Ts have been low; they are considering injections  Vitamin D deficiency; energy level is "fair"  OSA; using CPAP; helps energy, "it's better"  High TG and low HDL; LDL was great; may run in the family; eats some starches and will try to back off  Depression screen [facility] 2/9 01/29/2019 09/29/2018 06/23/2018 09/23/2017 01/14/2017  Decreased Interest 0 0 0 0 0  Down, Depressed, Hopeless 0 0 0 0 0  PHQ - 2 Score 0 0 0 0 0  Altered sleeping 0 0 - - -  Tired, decreased energy 0 0 - - -  Change in appetite 0 0 - - -  Feeling bad or failure about yourself  0 0 - - -  Trouble concentrating 0 0 - - -  Moving slowly or fidgety/restless 0 0 - - -  Suicidal thoughts - 0 - - -  PHQ-9 Score 0 0 - - -  Difficult doing work/chores Not difficult at all Not difficult at all - - -   Fall Risk  01/29/2019 09/29/2018 06/23/2018 09/23/2017  01/14/2017  Falls in the past year? 0 No No No No    Relevant past medical, surgical, family and social history reviewed Past Medical History:  Diagnosis Date  . Diabetes mellitus without complication ([place])   . Diabetic retinopathy ([place])   . Diastasis recti 12/31/2016  . Erectile dysfunction   . Erectile dysfunction associated with type 2 diabetes mellitus ([place]) 02/20/2015  . 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 Hyperlipidemia LDL goal <100 06/13/2015  . Hypertension   . Hypertension goal BP (blood pressure) < 130/80 06/13/2015  . Hypogonadism, male   . Obesity, Class III, BMI 40-49.9 (morbid obesity) ([place]) 02/20/2015  . Sleep apnea 09/29/2018  . Type 2 diabetes mellitus ([place]) 10/01/2015  . Uncontrolled type 2 diabetes mellitus with peripheral circulatory disorder ([place]) 06/13/2015   Past Surgical History:  Procedure Laterality Date  . COLONOSCOPY  2013  . COLONOSCOPY WITH PROPOFOL N/A 06/02/2018   Procedure: COLONOSCOPY WITH PROPOFOL;  Surgeon: Jonathon Bellows, MD;  Location: [facility] ENDOSCOPY;  Service: Gastroenterology;  Laterality: N/A;   Family History  Problem Relation Age of Onset  . Diabetes  Mother   . Kidney disease Mother   . Diabetes Brother   . Diabetes Brother   . Diabetes Maternal Grandfather   . Hypertension Maternal Grandfather   . Stroke Maternal Grandfather    Social History   Tobacco Use  . Smoking status: Never Smoker  . Smokeless tobacco: Never Used  Substance Use Topics  . Alcohol use: No    Alcohol/week: 0.0 standard drinks  . Drug use: No     Office Visit from 01/29/2019 in [facility]  AUDIT-C Score  0      Interim medical history since last visit reviewed. Allergies and medications reviewed  Review of Systems Per HPI unless specifically indicated above     Objective:    BP 130/88   Pulse 84   Temp 98.2 F (36.8 C)   Ht 5\' 10"  (1.778 m)   Wt 297 lb 14.4 oz (135.1 kg)   SpO2 98%   BMI 42.74 kg/m   Wt Readings from Last 3 Encounters:    01/29/19 297 lb 14.4 oz (135.1 kg)  12/14/18 299 lb 1.6 oz (135.7 kg)  09/29/18 295 lb 1.6 oz (133.9 kg)    Physical Exam Constitutional:      General: He is not in acute distress.    Appearance: He is well-developed. He is obese. He is not diaphoretic.  HENT:     Head: Normocephalic and atraumatic.  Eyes:     General: No scleral icterus. Neck:     Thyroid: No thyromegaly.  Cardiovascular:     Rate and Rhythm: Normal rate and regular rhythm.  Pulmonary:     Effort: Pulmonary effort is normal.     Breath sounds: Normal breath sounds.  Abdominal:     General: Bowel sounds are normal. There is no distension.     Palpations: Abdomen is soft.     Tenderness: There is no abdominal tenderness.  Skin:    General: Skin is warm and dry.     Coloration: Skin is not pale.  Neurological:     Mental Status: He is alert.     Coordination: Coordination normal.  Psychiatric:        Mood and Affect: Mood is not anxious or depressed.        Behavior: Behavior normal.    Diabetic Foot Form - Detailed   Diabetic Foot Exam - detailed Diabetic Foot exam was performed with the following findings:  Yes 01/29/2019  9:04 AM  Visual Foot Exam completed.:  Yes  Are the shoes appropriate in style and fit?:  Yes Is there swelling or and abnormal foot shape?:  No Normal Range of Motion:  Yes Pulse Foot Exam completed.:  Yes  Right Dorsalis Pedis:  Present Left Dorsalis Pedis:  Present  Sensory Foot Exam Completed.:  Yes Semmes-Weinstein Monofilament Test R Site 1-Great Toe:  Pos L Site 1-Great Toe:  Pos    Comments:  Left great toenail, narrow and curved; very dark with reddish to almost black coloration in the nail; pinky toe on the left also discolored and thickened     Results for orders placed or performed in visit on 08/03/18  Testosterone  Result Value Ref Range   Testosterone 172 (L) 264 - 916 ng/dL  FSH/LH  Result Value Ref Range   LH 3.6 1.7 - 8.6 mIU/mL   FSH 6.9 1.5 - 12.4 mIU/mL   Prolactin  Result Value Ref Range   Prolactin 7.1 4.0 - 15.2 ng/mL  Specimen status  report  Result Value Ref Range   specimen status report Comment       Assessment & Plan:   Problem List Items Addressed This Visit      Cardiovascular and Mediastinum   Uncontrolled type 2 diabetes mellitus with peripheral circulatory disorder ([place]) (Chronic)   Relevant Orders   Ambulatory referral to Podiatry   Hypertension goal BP (blood pressure) < 130/80 (Chronic)    Try to limit salt, fast food, lose weight; send in 90 day supply of Rx once Cr back        Respiratory   Sleep apnea (Chronic)    Using CPAP        Other   Vitamin D deficiency (Chronic)    Check vit D level and supplement; no added D now      Relevant Orders   VITAMIN D 25 Hydroxy (Vit-D Deficiency, Fractures)   Obesity, Class III, BMI 40-49.9 (morbid obesity) (HCC) (Chronic)    Encouraged weight loss which should help T; he politely declined nutritionist referral but he is welcome to call if changes mind      Medication monitoring encounter    Monitor renal function, esp with mother's kidney dz due to DM      Relevant Orders   COMPLETE METABOLIC PANEL WITH GFR   Low testosterone in male    Weight loss urged; seeing urologist      Hyperlipidemia LDL goal <70    Try to limit saturated fats and work on weigh tloss      Relevant Orders   Lipid panel    Other Visit Diagnoses    Nail abnormalities    -  Primary   Relevant Orders   Ambulatory referral to Podiatry       Follow up plan: Return in about 6 months (around 07/30/2019) for follow-up visit with Dr. Sanda Klein.  An after-visit summary was printed and given to the patient at [place].  Please see the patient instructions which may contain other information and recommendations beyond what is mentioned above in the assessment and plan.  No orders of the defined types were placed in this encounter.   Orders Placed This Encounter  Procedures  . COMPLETE  METABOLIC PANEL WITH GFR  . Lipid panel  . VITAMIN D 25 Hydroxy (Vit-D Deficiency, Fractures)  . Ambulatory referral to Podiatry

## 2019-01-29 NOTE — Assessment & Plan Note (Addendum)
Try to limit salt, fast food, lose weight; send in 90 day supply of Rx once Cr back

## 2019-01-29 NOTE — Assessment & Plan Note (Signed)
Using CPAP 

## 2019-01-29 NOTE — Assessment & Plan Note (Signed)
Monitor renal function, esp with mother's kidney dz due to DM

## 2019-01-29 NOTE — Assessment & Plan Note (Signed)
Weight loss urged; seeing urologist

## 2019-01-29 NOTE — Assessment & Plan Note (Signed)
Check vit D level and supplement; no added D now

## 2019-01-29 NOTE — Assessment & Plan Note (Signed)
Try to limit saturated fats and work on weigh tloss

## 2019-01-30 ENCOUNTER — Other Ambulatory Visit: Payer: Self-pay | Admitting: Family Medicine

## 2019-01-30 LAB — LIPID PANEL
CHOL/HDL RATIO: 3.1 (calc) (ref <5.0)
Cholesterol: 107 mg/dL (ref <200)
HDL: 34 mg/dL — ABNORMAL LOW (ref >=40)
LDL Cholesterol (Calc): 51 mg/dL (calc)
Non-HDL Cholesterol (Calc): 73 mg/dL (calc) (ref <130)
Triglycerides: 140 mg/dL (ref <150)

## 2019-01-30 LAB — COMPLETE METABOLIC PANEL WITH GFR
AG RATIO: 1.9 (calc) (ref 1.0–2.5)
ALBUMIN MSPROF: 4.5 g/dL (ref 3.6–5.1)
ALT: 17 U/L (ref 9–46)
AST: 23 U/L (ref 10–35)
Alkaline phosphatase (APISO): 60 U/L (ref 35–144)
BILIRUBIN TOTAL: 1 mg/dL (ref 0.2–1.2)
BUN: 14 mg/dL (ref 7–25)
CHLORIDE: 100 mmol/L (ref 98–110)
CO2: 29 mmol/L (ref 20–32)
Calcium: 9.5 mg/dL (ref 8.6–10.3)
Creat: 1.09 mg/dL (ref 0.70–1.33)
GFR, EST AFRICAN AMERICAN: 91 mL/min/{1.73_m2} (ref >=60)
GFR, Est Non African American: 79 mL/min/{1.73_m2} (ref >=60)
Globulin: 2.4 g/dL (calc) (ref 1.9–3.7)
Glucose, Bld: 143 mg/dL — ABNORMAL HIGH (ref 65–139)
POTASSIUM: 3.8 mmol/L (ref 3.5–5.3)
Sodium: 139 mmol/L (ref 135–146)
TOTAL PROTEIN: 6.9 g/dL (ref 6.1–8.1)

## 2019-01-30 LAB — VITAMIN D 25 HYDROXY (VIT D DEFICIENCY, FRACTURES): VIT D 25 HYDROXY: 20 ng/mL — AB (ref 30–100)

## 2019-01-30 MED ORDER — VITAMIN D (ERGOCALCIFEROL) 1.25 MG (50000 UNIT) PO CAPS: 50000.0000 [IU] | ORAL_CAPSULE | ORAL | 0 refills | Status: DC

## 2019-02-09 ENCOUNTER — Encounter (INDEPENDENT_AMBULATORY_CARE_PROVIDER_SITE_OTHER): Payer: BLUE CROSS/BLUE SHIELD | Admitting: Ophthalmology

## 2019-02-09 ENCOUNTER — Telehealth: Payer: Self-pay | Admitting: Urology

## 2019-02-09 DIAGNOSIS — H35033 Hypertensive retinopathy, bilateral: Secondary | ICD-10-CM

## 2019-02-09 DIAGNOSIS — H43813 Vitreous degeneration, bilateral: Secondary | ICD-10-CM

## 2019-02-09 DIAGNOSIS — I10 Essential (primary) hypertension: Secondary | ICD-10-CM | POA: Diagnosis not present

## 2019-02-09 DIAGNOSIS — E113313 Type 2 diabetes mellitus with moderate nonproliferative diabetic retinopathy with macular edema, bilateral: Secondary | ICD-10-CM

## 2019-02-09 DIAGNOSIS — E11311 Type 2 diabetes mellitus with unspecified diabetic retinopathy with macular edema: Secondary | ICD-10-CM | POA: Diagnosis not present

## 2019-02-09 NOTE — Telephone Encounter (Signed)
Pt would like a return call concerning his edex injections.

## 2019-02-12 NOTE — Telephone Encounter (Signed)
Spoke to patient, he states he had done the injection with success and would like to get the medication of home use. Please advise.

## 2019-02-13 ENCOUNTER — Telehealth: Payer: Self-pay | Admitting: Family Medicine

## 2019-02-13 MED ORDER — ALPROSTADIL (VASODILATOR) 10 MCG IC KIT: 10.0000 ug | PACK | INTRACAVERNOUS | 12 refills | Status: DC | PRN

## 2019-02-13 NOTE — Telephone Encounter (Signed)
Copied from [place] [phone]. Topic: Quick Communication - Rx Refill/Question >> Feb 13, 2019  4:48 PM Blase Mess A wrote: Medication: Patient is requesting [facility] ULTRA STRIPS but this is in his chart Jonetta Speak LANCETS FINE [place] [[id number]]   Has the patient contacted their pharmacy? Yes  (Agent: If no, request that the patient contact the pharmacy for the refill.) (Agent: If yes, when and what did the pharmacy advise?)  Preferred Pharmacy (with phone number or street name): [id number] [place], [address] [place] [address] Phone: [phone] Fax: [phone] Not a 24 hour pharmacy; exact hours not known    Agent: Please be advised that RX refills may take up to 3 business days. We ask that you follow-up with your pharmacy.

## 2019-02-13 NOTE — Telephone Encounter (Signed)
We need to send in a script for the [facility] and he needs to stop by the [facility] office and pick up a co-pay card to offset the cost and we need to know what pharmacy to send the script into.

## 2019-02-13 NOTE — Telephone Encounter (Signed)
Spok eto patient and sent RX to pharmacy

## 2019-02-14 ENCOUNTER — Other Ambulatory Visit: Payer: Self-pay | Admitting: Family Medicine

## 2019-02-14 NOTE — Telephone Encounter (Signed)
Left voicemail that this needs to come fron endo dr.

## 2019-02-14 NOTE — Telephone Encounter (Signed)
Lab Results  Component Value Date   CREATININE 1.09 01/29/2019   Lab Results  Component Value Date   K 3.8 01/29/2019   Rx approved

## 2019-02-16 ENCOUNTER — Ambulatory Visit: Payer: BLUE CROSS/BLUE SHIELD | Admitting: Podiatry

## 2019-02-22 ENCOUNTER — Other Ambulatory Visit: Payer: Self-pay | Admitting: Family Medicine

## 2019-02-22 NOTE — Telephone Encounter (Signed)
Request received for 50k vitamin D Rx Please let him know last lab recommendations: "Vitamin D is low so start Rx once a week for four weeks, then just 800 iu vit D3 daily OTC"

## 2019-02-23 NOTE — Telephone Encounter (Signed)
Left voicmeail

## 2019-03-09 ENCOUNTER — Other Ambulatory Visit: Payer: Self-pay

## 2019-03-09 ENCOUNTER — Encounter (INDEPENDENT_AMBULATORY_CARE_PROVIDER_SITE_OTHER): Payer: BLUE CROSS/BLUE SHIELD | Admitting: Ophthalmology

## 2019-03-09 DIAGNOSIS — H35033 Hypertensive retinopathy, bilateral: Secondary | ICD-10-CM | POA: Diagnosis not present

## 2019-03-09 DIAGNOSIS — I10 Essential (primary) hypertension: Secondary | ICD-10-CM

## 2019-03-09 DIAGNOSIS — E113313 Type 2 diabetes mellitus with moderate nonproliferative diabetic retinopathy with macular edema, bilateral: Secondary | ICD-10-CM

## 2019-03-09 DIAGNOSIS — E11311 Type 2 diabetes mellitus with unspecified diabetic retinopathy with macular edema: Secondary | ICD-10-CM | POA: Diagnosis not present

## 2019-03-09 DIAGNOSIS — H2513 Age-related nuclear cataract, bilateral: Secondary | ICD-10-CM

## 2019-04-13 ENCOUNTER — Encounter (INDEPENDENT_AMBULATORY_CARE_PROVIDER_SITE_OTHER): Payer: BLUE CROSS/BLUE SHIELD | Admitting: Ophthalmology

## 2019-04-13 ENCOUNTER — Other Ambulatory Visit: Payer: Self-pay

## 2019-04-13 DIAGNOSIS — H35033 Hypertensive retinopathy, bilateral: Secondary | ICD-10-CM | POA: Diagnosis not present

## 2019-04-13 DIAGNOSIS — I10 Essential (primary) hypertension: Secondary | ICD-10-CM | POA: Diagnosis not present

## 2019-04-13 DIAGNOSIS — E11311 Type 2 diabetes mellitus with unspecified diabetic retinopathy with macular edema: Secondary | ICD-10-CM | POA: Diagnosis not present

## 2019-04-13 DIAGNOSIS — E113313 Type 2 diabetes mellitus with moderate nonproliferative diabetic retinopathy with macular edema, bilateral: Secondary | ICD-10-CM | POA: Diagnosis not present

## 2019-04-13 DIAGNOSIS — H2513 Age-related nuclear cataract, bilateral: Secondary | ICD-10-CM

## 2019-04-13 DIAGNOSIS — H43813 Vitreous degeneration, bilateral: Secondary | ICD-10-CM

## 2019-04-19 ENCOUNTER — Encounter: Payer: Self-pay | Admitting: Nurse Practitioner

## 2019-04-19 ENCOUNTER — Encounter: Payer: Self-pay | Admitting: Family Medicine

## 2019-04-23 ENCOUNTER — Ambulatory Visit: Payer: BLUE CROSS/BLUE SHIELD | Admitting: Podiatry

## 2019-04-23 ENCOUNTER — Encounter: Payer: Self-pay | Admitting: Podiatry

## 2019-04-23 ENCOUNTER — Other Ambulatory Visit: Payer: Self-pay

## 2019-04-23 VITALS — Temp 96.7°F

## 2019-04-23 DIAGNOSIS — E119 Type 2 diabetes mellitus without complications: Secondary | ICD-10-CM | POA: Diagnosis not present

## 2019-04-23 DIAGNOSIS — L609 Nail disorder, unspecified: Secondary | ICD-10-CM

## 2019-04-23 DIAGNOSIS — M79674 Pain in right toe(s): Secondary | ICD-10-CM | POA: Diagnosis not present

## 2019-04-23 DIAGNOSIS — B351 Tinea unguium: Secondary | ICD-10-CM | POA: Diagnosis not present

## 2019-04-23 NOTE — Progress Notes (Addendum)
This patient RETURNS to the office with chief complaint of long thick nail right foot. and diabetic feet.  This patient  says there  is  no pain and discomfort in their feet.  This patient says right great toenail is thick and discolored. Patient has no history of infection or drainage from both feet.  Patient is unable to  self treat his own nails . This patient presents  to the office today for treatment of the  long nails and a foot evaluation due to history of  diabetes.  General Appearance  Alert, conversant and in no acute stress.  Vascular  Dorsalis pedis and posterior tibial  pulses are palpable  bilaterally.  Capillary return is within normal limits  bilaterally. Temperature is within normal limits  bilaterally.  Neurologic  Senn-Weinstein monofilament wire test within normal limits  bilaterally. Muscle power within normal limits bilaterally.  Nails Thick disfigured discolored nails with subungual debris  Hallux right foot.. No evidence of bacterial infection or drainage bilaterally.  Left hallux nail unattached to nail bed.  Orthopedic  No limitations of motion of motion feet .  No crepitus or effusions noted.  No bony pathology or digital deformities noted.  Skin  normotropic skin with no porokeratosis noted bilaterally.  No signs of infections or ulcers noted.     Onychomycosis  Diabetes with no foot complications  IE  Debride nails x 10.  A diabetic foot exam was performed and there is no evidence of any vascular or neurologic pathology.   RTC 1 year.  Prescribe Formula 7.   Gardiner Barefoot DPM

## 2019-05-11 ENCOUNTER — Encounter (INDEPENDENT_AMBULATORY_CARE_PROVIDER_SITE_OTHER): Payer: BLUE CROSS/BLUE SHIELD | Admitting: Ophthalmology

## 2019-05-17 ENCOUNTER — Encounter (INDEPENDENT_AMBULATORY_CARE_PROVIDER_SITE_OTHER): Payer: BLUE CROSS/BLUE SHIELD | Admitting: Ophthalmology

## 2019-05-17 ENCOUNTER — Other Ambulatory Visit: Payer: Self-pay

## 2019-05-17 DIAGNOSIS — E113313 Type 2 diabetes mellitus with moderate nonproliferative diabetic retinopathy with macular edema, bilateral: Secondary | ICD-10-CM | POA: Diagnosis not present

## 2019-05-17 DIAGNOSIS — E11311 Type 2 diabetes mellitus with unspecified diabetic retinopathy with macular edema: Secondary | ICD-10-CM

## 2019-05-17 DIAGNOSIS — H43813 Vitreous degeneration, bilateral: Secondary | ICD-10-CM

## 2019-05-17 DIAGNOSIS — H35033 Hypertensive retinopathy, bilateral: Secondary | ICD-10-CM

## 2019-05-17 DIAGNOSIS — H2513 Age-related nuclear cataract, bilateral: Secondary | ICD-10-CM

## 2019-05-17 DIAGNOSIS — I10 Essential (primary) hypertension: Secondary | ICD-10-CM | POA: Diagnosis not present

## 2019-06-15 ENCOUNTER — Other Ambulatory Visit: Payer: Self-pay

## 2019-06-15 ENCOUNTER — Encounter (INDEPENDENT_AMBULATORY_CARE_PROVIDER_SITE_OTHER): Payer: BLUE CROSS/BLUE SHIELD | Admitting: Ophthalmology

## 2019-06-15 DIAGNOSIS — E113313 Type 2 diabetes mellitus with moderate nonproliferative diabetic retinopathy with macular edema, bilateral: Secondary | ICD-10-CM

## 2019-06-15 DIAGNOSIS — H35033 Hypertensive retinopathy, bilateral: Secondary | ICD-10-CM | POA: Diagnosis not present

## 2019-06-15 DIAGNOSIS — I10 Essential (primary) hypertension: Secondary | ICD-10-CM

## 2019-06-15 DIAGNOSIS — H43813 Vitreous degeneration, bilateral: Secondary | ICD-10-CM

## 2019-06-15 DIAGNOSIS — E11311 Type 2 diabetes mellitus with unspecified diabetic retinopathy with macular edema: Secondary | ICD-10-CM | POA: Diagnosis not present

## 2019-06-15 DIAGNOSIS — H2513 Age-related nuclear cataract, bilateral: Secondary | ICD-10-CM

## 2019-07-06 ENCOUNTER — Other Ambulatory Visit: Payer: Self-pay | Admitting: Family Medicine

## 2019-07-09 ENCOUNTER — Other Ambulatory Visit: Payer: Self-pay | Admitting: Family Medicine

## 2019-07-13 ENCOUNTER — Other Ambulatory Visit: Payer: Self-pay

## 2019-07-13 ENCOUNTER — Encounter (INDEPENDENT_AMBULATORY_CARE_PROVIDER_SITE_OTHER): Payer: BC Managed Care – PPO | Admitting: Ophthalmology

## 2019-07-13 DIAGNOSIS — I10 Essential (primary) hypertension: Secondary | ICD-10-CM | POA: Diagnosis not present

## 2019-07-13 DIAGNOSIS — H43813 Vitreous degeneration, bilateral: Secondary | ICD-10-CM

## 2019-07-13 DIAGNOSIS — H2513 Age-related nuclear cataract, bilateral: Secondary | ICD-10-CM

## 2019-07-13 DIAGNOSIS — H35033 Hypertensive retinopathy, bilateral: Secondary | ICD-10-CM | POA: Diagnosis not present

## 2019-07-13 DIAGNOSIS — E113313 Type 2 diabetes mellitus with moderate nonproliferative diabetic retinopathy with macular edema, bilateral: Secondary | ICD-10-CM

## 2019-07-13 DIAGNOSIS — E11311 Type 2 diabetes mellitus with unspecified diabetic retinopathy with macular edema: Secondary | ICD-10-CM | POA: Diagnosis not present

## 2019-07-30 ENCOUNTER — Ambulatory Visit: Payer: BLUE CROSS/BLUE SHIELD | Admitting: Family Medicine

## 2019-08-06 ENCOUNTER — Encounter: Payer: Self-pay | Admitting: Nurse Practitioner

## 2019-08-06 ENCOUNTER — Ambulatory Visit (INDEPENDENT_AMBULATORY_CARE_PROVIDER_SITE_OTHER): Payer: BLUE CROSS/BLUE SHIELD | Admitting: Nurse Practitioner

## 2019-08-06 VITALS — Resp 16

## 2019-08-06 DIAGNOSIS — E1165 Type 2 diabetes mellitus with hyperglycemia: Secondary | ICD-10-CM

## 2019-08-06 DIAGNOSIS — I10 Essential (primary) hypertension: Secondary | ICD-10-CM

## 2019-08-06 DIAGNOSIS — E11311 Type 2 diabetes mellitus with unspecified diabetic retinopathy with macular edema: Secondary | ICD-10-CM

## 2019-08-06 DIAGNOSIS — N521 Erectile dysfunction due to diseases classified elsewhere: Secondary | ICD-10-CM

## 2019-08-06 DIAGNOSIS — G4733 Obstructive sleep apnea (adult) (pediatric): Secondary | ICD-10-CM

## 2019-08-06 DIAGNOSIS — E1169 Type 2 diabetes mellitus with other specified complication: Secondary | ICD-10-CM

## 2019-08-06 DIAGNOSIS — E559 Vitamin D deficiency, unspecified: Secondary | ICD-10-CM

## 2019-08-06 DIAGNOSIS — E1151 Type 2 diabetes mellitus with diabetic peripheral angiopathy without gangrene: Secondary | ICD-10-CM

## 2019-08-06 DIAGNOSIS — IMO0002 Reserved for concepts with insufficient information to code with codable children: Secondary | ICD-10-CM

## 2019-08-06 MED ORDER — LISINOPRIL 40 MG PO TABS: 40.0000 mg | ORAL_TABLET | Freq: Every day | ORAL | 1 refills | Status: DC

## 2019-08-06 MED ORDER — ATORVASTATIN CALCIUM 40 MG PO TABS: 40.0000 mg | ORAL_TABLET | Freq: Every day | ORAL | 1 refills | Status: DC

## 2019-08-06 MED ORDER — HYDROCHLOROTHIAZIDE 25 MG PO TABS: 25.0000 mg | ORAL_TABLET | Freq: Every day | ORAL | 1 refills | Status: DC

## 2019-08-06 NOTE — Progress Notes (Signed)
Virtual Visit via Video Note  I connected with Clarence Black on 08/06/19 at  2:20 PM EDT by a video enabled telemedicine application and verified that I am speaking with the correct person using two identifiers.   Staff discussed the limitations of evaluation and management by telemedicine and the availability of in person appointments. The patient expressed understanding and agreed to proceed.  Patient location: home  My location: work office Other people present: none  HPI Diabetes Mellitus Patient is rx  glipizide, Xigduo and Victoza . Takes medications as prescribed with nomissed doses a month.  Diet: states has eaten a lot of better, reduced soft drinks and carbs.  Checks blood sugars at home, fasting blood sugars have been 100-130,  Denies polyphagia, polydipsia, polyuria.  Follows up with endocrinology- Dr. Gabriel Carina; last seen 05/09/2019 Last A1C was 7.4% Foot checks occasionally, denies neuropathy.  Is taking statin and ACEi Has diabetic retinopathy macular degeneration- bilateral, getting monthly injfections with opthamology- viision is good with glasses.   Hypertension Patient is on amlodipine 5mg  daily, HCTZ 25mg  daily, lisinopril 40mg  daily   Takes medications as prescribed with no missed doses a month.  He is compliant with low-salt diet.  Denies chest pain, headaches, blurry vision, lightheadedness, dizziness.  Obstructive sleep apnea syndrome Self reported CPAP compliance: 92% Does not notice improvements with it.   Erectile dysfunction  Takes sildenafil 60-100mg  PRN  Saw urology in 11/2018 and started on EDEX but it was too expensive.   Vitamin D deficiency Takes 1000 IU daily    PHQ2/9: Depression screen [facility] 2/9 08/06/2019 01/29/2019 09/29/2018 06/23/2018 09/23/2017  Decreased Interest 0 0 0 0 0  Down, Depressed, Hopeless 0 0 0 0 0  PHQ - 2 Score 0 0 0 0 0  Altered sleeping 0 0 0 - -  Tired, decreased energy 0 0 0 - -  Change in appetite 0 0 0 - -  Feeling  bad or failure about yourself  0 0 0 - -  Trouble concentrating 0 0 0 - -  Moving slowly or fidgety/restless 0 0 0 - -  Suicidal thoughts 0 - 0 - -  PHQ-9 Score 0 0 0 - -  Difficult doing work/chores - Not difficult at all Not difficult at all - -    PHQ reviewed. Negative  Patient Active Problem List   Diagnosis Date Noted  . 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 Vitamin D deficiency 01/29/2019  . Sleep apnea 09/29/2018  . Umbilical hernia without obstruction and without gangrene 12/31/2016  . Ventral hernia without obstruction or gangrene 12/31/2016  . Low testosterone in male 10/22/2016  . Uncontrolled type 2 diabetes mellitus with peripheral circulatory disorder ([place]) 06/13/2015  . Hyperlipidemia LDL goal <70 06/13/2015  . Hypertension goal BP (blood pressure) < 130/80 06/13/2015  . Recurrent boils 06/13/2015  . Erectile dysfunction associated with type 2 diabetes mellitus ([place]) 02/20/2015  . Obesity, Class III, BMI 40-49.9 (morbid obesity) ([place]) 02/20/2015    Past Medical History:  Diagnosis Date  . Diabetes mellitus without complication ([place])   . Diabetic retinopathy ([place])   . Diastasis recti 12/31/2016  . Erectile dysfunction   . Erectile dysfunction associated with type 2 diabetes mellitus ([place]) 02/20/2015  . 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 Hyperlipidemia LDL goal <100 06/13/2015  . Hypertension   . Hypertension goal BP (blood pressure) < 130/80 06/13/2015  . Hypogonadism, male   . Obesity, Class III, BMI  40-49.9 (morbid obesity) ([place]) 02/20/2015  . Sleep apnea 09/29/2018  . Type 2 diabetes mellitus ([place]) 10/01/2015  . Uncontrolled type 2 diabetes mellitus with peripheral circulatory disorder ([place]) 06/13/2015    Past Surgical History:  Procedure Laterality Date  . COLONOSCOPY  2013  . COLONOSCOPY WITH PROPOFOL N/A 06/02/2018   Procedure: COLONOSCOPY WITH PROPOFOL;  Surgeon: Jonathon Bellows, MD;  Location: [facility]  ENDOSCOPY;  Service: Gastroenterology;  Laterality: N/A;    Social History   Tobacco Use  . Smoking status: Never Smoker  . Smokeless tobacco: Never Used  Substance Use Topics  . Alcohol use: No    Alcohol/week: 0.0 standard drinks     Current Outpatient Medications:  .  amLODipine (NORVASC) 5 MG tablet, TAKE 1 TABLET (5 MG TOTAL) BY MOUTH DAILY. (FOR BLOOD PRESSURE), Disp: 90 tablet, Rfl: 2 .  aspirin EC 81 MG tablet, Take 1 tablet (81 mg total) by mouth daily. To help prevent heart attack, Disp: 90 tablet, Rfl: 1 .  atorvastatin (LIPITOR) 40 MG tablet, TAKE 1 TABLET BY MOUTH EVERYDAY AT BEDTIME, Disp: 90 tablet, Rfl: 0 .  glipiZIDE (GLUCOTROL XL) 10 MG 24 hr tablet, Take 2 tablets (20 mg total) by mouth daily., Disp: , Rfl:  .  hydrochlorothiazide (HYDRODIURIL) 25 MG tablet, TAKE 1 TABLET BY MOUTH EVERY DAY, Disp: 90 tablet, Rfl: 1 .  Liraglutide (VICTOZA) 18 MG/3ML SOPN, Inject 0.3 mLs (1.8 mg total) into the skin daily., Disp: 15 pen, Rfl: 1 .  lisinopril (PRINIVIL,ZESTRIL) 40 MG tablet, TAKE 1 TABLET (40 MG TOTAL) BY MOUTH DAILY. (FOR KIDNEY PROTECTION AND BLOOD PRESSURE), Disp: 30 tablet, Rfl: 5 .  Multiple Vitamin (MULTIVITAMIN) tablet, Take 1 tablet by mouth daily., Disp: , Rfl:  .  sildenafil (REVATIO) 20 MG tablet, Take 3-5 tablets (60-100 mg total) by mouth daily as needed., Disp: 30 tablet, Rfl: 3 .  XIGDUO XR 04-999 MG TB24, Take 2 tablets by mouth daily., Disp: , Rfl: 11 .  B-D UF III MINI PEN NEEDLES 31G X 5 MM MISC, FOR USE WITH INJECTABLE MEDICINE, ONCE A DAY, Disp: 90 each, Rfl: 3 .  BESIVANCE 0.6 % SUSP, PLACE 1 DROP INTO RIGHT EYE 4 TIMES A DAY FOR 2 DAYS AFTER EACH MONTHLY EYE INJECTION, Disp: , Rfl: 12 .  glucose blood (ONE TOUCH ULTRA TEST) test strip, Use as instructed, Disp: 300 each, Rfl: 3 .  Insulin Degludec 200 UNIT/ML SOPN, Inject into the skin., Disp: , Rfl:  .  ONETOUCH DELICA LANCETS FINE MISC, 1 Device by Does not apply route 3 (three) times daily.,  Disp: 300 each, Rfl: 3 .  prednisoLONE acetate (PRED FORTE) 1 % ophthalmic suspension, INSTILL 1 DROP IN THE RIGHT EYE 3 TIMES A DAY FOR 5 DAYS THEN DISCONTINUE, Disp: , Rfl: 6  No Known Allergies  ROS   No other specific complaints in a complete review of systems (except as listed in HPI above).  Objective  Vitals:   08/06/19 1436  Resp: 16     There is no height or weight on file to calculate BMI.  Nursing Note and Vital Signs reviewed.  Physical Exam  Constitutional: Patient appears well-developed and well-nourished. No distress.  HENT: Head: Normocephalic and atraumatic. Pulmonary/Chest: Effort normal  Neurological: alert and oriented, speech normal.  Psychiatric: Patient has a normal mood and affect. behavior is normal. Judgment and thought content normal.    Assessment & Plan  1. Uncontrolled type 2 diabetes mellitus with peripheral circulatory disorder ([place]) Improved follows  up with endocrinology  - atorvastatin (LIPITOR) 40 MG tablet; Take 1 tablet (40 mg total) by mouth daily at 6 PM.  Dispense: 90 tablet; Refill: 1  2. Hypertension goal BP (blood pressure) < 130/80 Continue meds  - hydrochlorothiazide (HYDRODIURIL) 25 MG tablet; Take 1 tablet (25 mg total) by mouth daily.  Dispense: 90 tablet; Refill: 1 - lisinopril (ZESTRIL) 40 MG tablet; Take 1 tablet (40 mg total) by mouth daily. (for kidney protection and blood pressure)  Dispense: 90 tablet; Refill: 1  3. Obstructive sleep apnea syndrome Continue CPAP  4. Erectile dysfunction associated with type 2 diabetes mellitus ([place]) Follows up with urology   5. 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, unspecified laterality, unspecified retinopathy severity ([place]) Follows up with opthamology  6. Vitamin D deficiency Continue supplementation, increase vitamin K in diet      Follow Up Instructions:   6 months  I discussed the assessment and treatment plan with the  patient. The patient was provided an opportunity to ask questions and all were answered. The patient agreed with the plan and demonstrated an understanding of the instructions.   The patient was advised to call back or seek an in-person evaluation if the symptoms worsen or if the condition fails to improve as anticipated.  I provided 21 minutes of non-face-to-face time during this encounter and chart review.   Fredderick Severance, NP

## 2019-08-09 ENCOUNTER — Encounter (INDEPENDENT_AMBULATORY_CARE_PROVIDER_SITE_OTHER): Payer: BC Managed Care – PPO | Admitting: Ophthalmology

## 2019-08-09 ENCOUNTER — Other Ambulatory Visit: Payer: Self-pay

## 2019-08-09 DIAGNOSIS — E113313 Type 2 diabetes mellitus with moderate nonproliferative diabetic retinopathy with macular edema, bilateral: Secondary | ICD-10-CM

## 2019-08-09 DIAGNOSIS — H35033 Hypertensive retinopathy, bilateral: Secondary | ICD-10-CM | POA: Diagnosis not present

## 2019-08-09 DIAGNOSIS — E11311 Type 2 diabetes mellitus with unspecified diabetic retinopathy with macular edema: Secondary | ICD-10-CM | POA: Diagnosis not present

## 2019-08-09 DIAGNOSIS — H43813 Vitreous degeneration, bilateral: Secondary | ICD-10-CM

## 2019-08-09 DIAGNOSIS — I10 Essential (primary) hypertension: Secondary | ICD-10-CM | POA: Diagnosis not present

## 2019-08-17 ENCOUNTER — Other Ambulatory Visit: Payer: Self-pay | Admitting: Family Medicine

## 2019-08-17 MED ORDER — BD PEN NEEDLE MINI U/F 31G X 5 MM MISC: 3 refills | Status: DC

## 2019-08-17 NOTE — Telephone Encounter (Signed)
Medication: Liraglutide (VICTOZA) 18 MG/3ML SOPN    Patient is requesting a refill.    [facility]  [id number] [place], [place] - [address] [phone] (Phone) [phone] (Fax)

## 2019-08-17 NOTE — Telephone Encounter (Signed)
Pt states totally out of needle not victoza

## 2019-09-06 ENCOUNTER — Encounter (INDEPENDENT_AMBULATORY_CARE_PROVIDER_SITE_OTHER): Payer: BC Managed Care – PPO | Admitting: Ophthalmology

## 2019-09-07 ENCOUNTER — Encounter (INDEPENDENT_AMBULATORY_CARE_PROVIDER_SITE_OTHER): Payer: BC Managed Care – PPO | Admitting: Ophthalmology

## 2019-09-10 ENCOUNTER — Other Ambulatory Visit: Payer: Self-pay

## 2019-09-10 ENCOUNTER — Encounter (INDEPENDENT_AMBULATORY_CARE_PROVIDER_SITE_OTHER): Payer: BC Managed Care – PPO | Admitting: Ophthalmology

## 2019-09-10 DIAGNOSIS — E113313 Type 2 diabetes mellitus with moderate nonproliferative diabetic retinopathy with macular edema, bilateral: Secondary | ICD-10-CM

## 2019-09-10 DIAGNOSIS — E11311 Type 2 diabetes mellitus with unspecified diabetic retinopathy with macular edema: Secondary | ICD-10-CM

## 2019-09-10 DIAGNOSIS — H35033 Hypertensive retinopathy, bilateral: Secondary | ICD-10-CM | POA: Diagnosis not present

## 2019-09-10 DIAGNOSIS — H43813 Vitreous degeneration, bilateral: Secondary | ICD-10-CM

## 2019-09-10 DIAGNOSIS — I10 Essential (primary) hypertension: Secondary | ICD-10-CM

## 2019-10-05 ENCOUNTER — Encounter (INDEPENDENT_AMBULATORY_CARE_PROVIDER_SITE_OTHER): Payer: BC Managed Care – PPO | Admitting: Ophthalmology

## 2019-10-05 ENCOUNTER — Other Ambulatory Visit: Payer: Self-pay

## 2019-10-05 DIAGNOSIS — E11311 Type 2 diabetes mellitus with unspecified diabetic retinopathy with macular edema: Secondary | ICD-10-CM

## 2019-10-05 DIAGNOSIS — E113313 Type 2 diabetes mellitus with moderate nonproliferative diabetic retinopathy with macular edema, bilateral: Secondary | ICD-10-CM

## 2019-10-05 DIAGNOSIS — H35033 Hypertensive retinopathy, bilateral: Secondary | ICD-10-CM | POA: Diagnosis not present

## 2019-10-05 DIAGNOSIS — I10 Essential (primary) hypertension: Secondary | ICD-10-CM | POA: Diagnosis not present

## 2019-10-05 DIAGNOSIS — H43813 Vitreous degeneration, bilateral: Secondary | ICD-10-CM

## 2019-10-24 ENCOUNTER — Encounter: Payer: Self-pay | Admitting: Family Medicine

## 2019-11-02 ENCOUNTER — Encounter (INDEPENDENT_AMBULATORY_CARE_PROVIDER_SITE_OTHER): Payer: BC Managed Care – PPO | Admitting: Ophthalmology

## 2019-11-02 DIAGNOSIS — I10 Essential (primary) hypertension: Secondary | ICD-10-CM

## 2019-11-02 DIAGNOSIS — E113313 Type 2 diabetes mellitus with moderate nonproliferative diabetic retinopathy with macular edema, bilateral: Secondary | ICD-10-CM

## 2019-11-02 DIAGNOSIS — H2513 Age-related nuclear cataract, bilateral: Secondary | ICD-10-CM

## 2019-11-02 DIAGNOSIS — E11311 Type 2 diabetes mellitus with unspecified diabetic retinopathy with macular edema: Secondary | ICD-10-CM | POA: Diagnosis not present

## 2019-11-02 DIAGNOSIS — H43813 Vitreous degeneration, bilateral: Secondary | ICD-10-CM

## 2019-11-02 DIAGNOSIS — H35033 Hypertensive retinopathy, bilateral: Secondary | ICD-10-CM | POA: Diagnosis not present

## 2019-11-06 ENCOUNTER — Encounter: Payer: Self-pay | Admitting: Family Medicine

## 2019-11-12 ENCOUNTER — Other Ambulatory Visit: Payer: Self-pay

## 2019-11-12 DIAGNOSIS — Z20822 Contact with and (suspected) exposure to covid-19: Secondary | ICD-10-CM

## 2019-11-13 LAB — INPATIENT

## 2019-11-13 LAB — NOVEL CORONAVIRUS, NAA: SARS-CoV-2, NAA: NOT DETECTED

## 2019-11-30 ENCOUNTER — Encounter (INDEPENDENT_AMBULATORY_CARE_PROVIDER_SITE_OTHER): Payer: BC Managed Care – PPO | Admitting: Ophthalmology

## 2019-11-30 ENCOUNTER — Other Ambulatory Visit: Payer: Self-pay

## 2019-11-30 DIAGNOSIS — E11311 Type 2 diabetes mellitus with unspecified diabetic retinopathy with macular edema: Secondary | ICD-10-CM | POA: Diagnosis not present

## 2019-11-30 DIAGNOSIS — H43813 Vitreous degeneration, bilateral: Secondary | ICD-10-CM

## 2019-11-30 DIAGNOSIS — I10 Essential (primary) hypertension: Secondary | ICD-10-CM

## 2019-11-30 DIAGNOSIS — H2513 Age-related nuclear cataract, bilateral: Secondary | ICD-10-CM

## 2019-11-30 DIAGNOSIS — E113313 Type 2 diabetes mellitus with moderate nonproliferative diabetic retinopathy with macular edema, bilateral: Secondary | ICD-10-CM | POA: Diagnosis not present

## 2019-11-30 DIAGNOSIS — H35033 Hypertensive retinopathy, bilateral: Secondary | ICD-10-CM | POA: Diagnosis not present

## 2020-01-04 ENCOUNTER — Other Ambulatory Visit: Payer: Self-pay

## 2020-01-04 ENCOUNTER — Encounter (INDEPENDENT_AMBULATORY_CARE_PROVIDER_SITE_OTHER): Payer: BC Managed Care – PPO | Admitting: Ophthalmology

## 2020-01-04 DIAGNOSIS — E113313 Type 2 diabetes mellitus with moderate nonproliferative diabetic retinopathy with macular edema, bilateral: Secondary | ICD-10-CM | POA: Diagnosis not present

## 2020-01-04 DIAGNOSIS — H35033 Hypertensive retinopathy, bilateral: Secondary | ICD-10-CM | POA: Diagnosis not present

## 2020-01-04 DIAGNOSIS — E11311 Type 2 diabetes mellitus with unspecified diabetic retinopathy with macular edema: Secondary | ICD-10-CM

## 2020-01-04 DIAGNOSIS — H43813 Vitreous degeneration, bilateral: Secondary | ICD-10-CM

## 2020-01-04 DIAGNOSIS — I10 Essential (primary) hypertension: Secondary | ICD-10-CM

## 2020-01-07 LAB — HEMOGLOBIN A1C: Hemoglobin A1C: 8.2

## 2020-02-01 ENCOUNTER — Encounter (INDEPENDENT_AMBULATORY_CARE_PROVIDER_SITE_OTHER): Payer: BC Managed Care – PPO | Admitting: Ophthalmology

## 2020-02-01 ENCOUNTER — Other Ambulatory Visit: Payer: Self-pay

## 2020-02-01 DIAGNOSIS — E11311 Type 2 diabetes mellitus with unspecified diabetic retinopathy with macular edema: Secondary | ICD-10-CM

## 2020-02-01 DIAGNOSIS — H35033 Hypertensive retinopathy, bilateral: Secondary | ICD-10-CM | POA: Diagnosis not present

## 2020-02-01 DIAGNOSIS — I10 Essential (primary) hypertension: Secondary | ICD-10-CM

## 2020-02-01 DIAGNOSIS — H43813 Vitreous degeneration, bilateral: Secondary | ICD-10-CM

## 2020-02-01 DIAGNOSIS — E113313 Type 2 diabetes mellitus with moderate nonproliferative diabetic retinopathy with macular edema, bilateral: Secondary | ICD-10-CM

## 2020-02-01 DIAGNOSIS — H2513 Age-related nuclear cataract, bilateral: Secondary | ICD-10-CM

## 2020-02-06 ENCOUNTER — Other Ambulatory Visit: Payer: Self-pay

## 2020-02-06 ENCOUNTER — Ambulatory Visit: Payer: BC Managed Care – PPO | Admitting: Family Medicine

## 2020-02-06 ENCOUNTER — Encounter: Payer: Self-pay | Admitting: Family Medicine

## 2020-02-06 VITALS — BP 140/92 | HR 96 | Temp 97.8°F | Resp 14 | Ht 70.0 in | Wt 290.7 lb

## 2020-02-06 DIAGNOSIS — E785 Hyperlipidemia, unspecified: Secondary | ICD-10-CM

## 2020-02-06 DIAGNOSIS — E11311 Type 2 diabetes mellitus with unspecified diabetic retinopathy with macular edema: Secondary | ICD-10-CM

## 2020-02-06 DIAGNOSIS — Z5181 Encounter for therapeutic drug level monitoring: Secondary | ICD-10-CM

## 2020-02-06 DIAGNOSIS — E1169 Type 2 diabetes mellitus with other specified complication: Secondary | ICD-10-CM

## 2020-02-06 DIAGNOSIS — I10 Essential (primary) hypertension: Secondary | ICD-10-CM

## 2020-02-06 DIAGNOSIS — G4733 Obstructive sleep apnea (adult) (pediatric): Secondary | ICD-10-CM

## 2020-02-06 DIAGNOSIS — E1151 Type 2 diabetes mellitus with diabetic peripheral angiopathy without gangrene: Secondary | ICD-10-CM

## 2020-02-06 DIAGNOSIS — N521 Erectile dysfunction due to diseases classified elsewhere: Secondary | ICD-10-CM

## 2020-02-06 DIAGNOSIS — F418 Other specified anxiety disorders: Secondary | ICD-10-CM

## 2020-02-06 DIAGNOSIS — E1165 Type 2 diabetes mellitus with hyperglycemia: Secondary | ICD-10-CM

## 2020-02-06 DIAGNOSIS — IMO0002 Reserved for concepts with insufficient information to code with codable children: Secondary | ICD-10-CM

## 2020-02-06 LAB — COMPLETE METABOLIC PANEL WITH GFR
AG Ratio: 1.8 (calc) (ref 1.0–2.5)
ALT: 14 U/L (ref 9–46)
AST: 21 U/L (ref 10–35)
Albumin: 4.5 g/dL (ref 3.6–5.1)
Alkaline phosphatase (APISO): 49 U/L (ref 35–144)
BUN: 16 mg/dL (ref 7–25)
CO2: 27 mmol/L (ref 20–32)
Calcium: 9.5 mg/dL (ref 8.6–10.3)
Chloride: 104 mmol/L (ref 98–110)
Creat: 1.06 mg/dL (ref 0.70–1.33)
GFR, Est African American: 94 mL/min/{1.73_m2} (ref >=60)
GFR, Est Non African American: 81 mL/min/{1.73_m2} (ref >=60)
Globulin: 2.5 g/dL (calc) (ref 1.9–3.7)
Glucose, Bld: 94 mg/dL (ref 65–99)
Potassium: 3.8 mmol/L (ref 3.5–5.3)
Sodium: 138 mmol/L (ref 135–146)
Total Bilirubin: 1 mg/dL (ref 0.2–1.2)
Total Protein: 7 g/dL (ref 6.1–8.1)

## 2020-02-06 LAB — LIPID PANEL
Cholesterol: 139 mg/dL (ref <200)
HDL: 39 mg/dL — ABNORMAL LOW (ref >=40)
LDL Cholesterol (Calc): 81 mg/dL (calc)
Non-HDL Cholesterol (Calc): 100 mg/dL (calc) (ref <130)
Total CHOL/HDL Ratio: 3.6 (calc) (ref <5.0)
Triglycerides: 95 mg/dL (ref <150)

## 2020-02-06 MED ORDER — BUSPIRONE HCL 5 MG PO TABS: 5.0000 mg | ORAL_TABLET | Freq: Two times a day (BID) | ORAL | 0 refills | Status: DC | PRN

## 2020-02-06 NOTE — Patient Instructions (Addendum)
DASH Eating Plan DASH stands for "Dietary Approaches to Stop Hypertension." The DASH eating plan is a healthy eating plan that has been shown to reduce high blood pressure (hypertension). It may also reduce your risk for type 2 diabetes, heart disease, and stroke. The DASH eating plan may also help with weight loss. What are tips for following this plan?  General guidelines  Avoid eating more than 2,300 mg (milligrams) of salt (sodium) a day. If you have hypertension, you may need to reduce your sodium intake to 1,500 mg a day.  Limit alcohol intake to no more than 1 drink a day for nonpregnant women and 2 drinks a day for men. One drink equals 12 oz of beer, 5 oz of wine, or 1 oz of hard liquor.  Work with your health care provider to maintain a healthy body weight or to lose weight. Ask what an ideal weight is for you.  Get at least 30 minutes of exercise that causes your heart to beat faster (aerobic exercise) most days of the week. Activities may include walking, swimming, or biking.  Work with your health care provider or diet and nutrition specialist (dietitian) to adjust your eating plan to your individual calorie needs. Reading food labels   Check food labels for the amount of sodium per serving. Choose foods with less than 5 percent of the Daily Value of sodium. Generally, foods with less than 300 mg of sodium per serving fit into this eating plan.  To find whole grains, look for the word "whole" as the first word in the ingredient list. Shopping  Buy products labeled as "low-sodium" or "no salt added."  Buy fresh foods. Avoid canned foods and premade or frozen meals. Cooking  Avoid adding salt when cooking. Use salt-free seasonings or herbs instead of table salt or sea salt. Check with your health care provider or pharmacist before using salt substitutes.  Do not fry foods. Cook foods using healthy methods such as baking, boiling, grilling, and broiling instead.  Cook with  heart-healthy oils, such as olive, canola, soybean, or sunflower oil. Meal planning  Eat a balanced diet that includes: ? 5 or more servings of fruits and vegetables each day. At each meal, try to fill half of your plate with fruits and vegetables. ? Up to 6-8 servings of whole grains each day. ? Less than 6 oz of lean meat, poultry, or fish each day. A 3-oz serving of meat is about the same size as a deck of cards. One egg equals 1 oz. ? 2 servings of low-fat dairy each day. ? A serving of nuts, seeds, or beans 5 times each week. ? Heart-healthy fats. Healthy fats called Omega-3 fatty acids are found in foods such as flaxseeds and coldwater fish, like sardines, salmon, and mackerel.  Limit how much you eat of the following: ? Canned or prepackaged foods. ? Food that is high in trans fat, such as fried foods. ? Food that is high in saturated fat, such as fatty meat. ? Sweets, desserts, sugary drinks, and other foods with added sugar. ? Full-fat dairy products.  Do not salt foods before eating.  Try to eat at least 2 vegetarian meals each week.  Eat more home-cooked food and less restaurant, buffet, and fast food.  When eating at a restaurant, ask that your food be prepared with less salt or no salt, if possible. What foods are recommended? The items listed may not be a complete list. Talk with your dietitian about   what dietary choices are best for you. Grains Whole-grain or whole-wheat bread. Whole-grain or whole-wheat pasta. Brown rice. Oatmeal. Quinoa. Bulgur. Whole-grain and low-sodium cereals. Pita bread. Low-fat, low-sodium crackers. Whole-wheat flour tortillas. Vegetables Fresh or frozen vegetables (raw, steamed, roasted, or grilled). Low-sodium or reduced-sodium tomato and vegetable juice. Low-sodium or reduced-sodium tomato sauce and tomato paste. Low-sodium or reduced-sodium canned vegetables. Fruits All fresh, dried, or frozen fruit. Canned fruit in natural juice (without  added sugar). Meat and other protein foods Skinless chicken or turkey. Ground chicken or turkey. Pork with fat trimmed off. Fish and seafood. Egg whites. Dried beans, peas, or lentils. Unsalted nuts, nut butters, and seeds. Unsalted canned beans. Lean cuts of beef with fat trimmed off. Low-sodium, lean deli meat. Dairy Low-fat (1%) or fat-free (skim) milk. Fat-free, low-fat, or reduced-fat cheeses. Nonfat, low-sodium ricotta or cottage cheese. Low-fat or nonfat yogurt. Low-fat, low-sodium cheese. Fats and oils Soft margarine without trans fats. Vegetable oil. Low-fat, reduced-fat, or light mayonnaise and salad dressings (reduced-sodium). Canola, safflower, olive, soybean, and sunflower oils. Avocado. Seasoning and other foods Herbs. Spices. Seasoning mixes without salt. Unsalted popcorn and pretzels. Fat-free sweets. What foods are not recommended? The items listed may not be a complete list. Talk with your dietitian about what dietary choices are best for you. Grains Baked goods made with fat, such as croissants, muffins, or some breads. Dry pasta or rice meal packs. Vegetables Creamed or fried vegetables. Vegetables in a cheese sauce. Regular canned vegetables (not low-sodium or reduced-sodium). Regular canned tomato sauce and paste (not low-sodium or reduced-sodium). Regular tomato and vegetable juice (not low-sodium or reduced-sodium). Pickles. Olives. Fruits Canned fruit in a light or heavy syrup. Fried fruit. Fruit in cream or butter sauce. Meat and other protein foods Fatty cuts of meat. Ribs. Fried meat. Bacon. Sausage. Bologna and other processed lunch meats. Salami. Fatback. Hotdogs. Bratwurst. Salted nuts and seeds. Canned beans with added salt. Canned or smoked fish. Whole eggs or egg yolks. Chicken or turkey with skin. Dairy Whole or 2% milk, cream, and half-and-half. Whole or full-fat cream cheese. Whole-fat or sweetened yogurt. Full-fat cheese. Nondairy creamers. Whipped toppings.  Processed cheese and cheese spreads. Fats and oils Butter. Stick margarine. Lard. Shortening. Ghee. Bacon fat. Tropical oils, such as coconut, palm kernel, or palm oil. Seasoning and other foods Salted popcorn and pretzels. Onion salt, garlic salt, seasoned salt, table salt, and sea salt. Worcestershire sauce. Tartar sauce. Barbecue sauce. Teriyaki sauce. Soy sauce, including reduced-sodium. Steak sauce. Canned and packaged gravies. Fish sauce. Oyster sauce. Cocktail sauce. Horseradish that you find on the shelf. Ketchup. Mustard. Meat flavorings and tenderizers. Bouillon cubes. Hot sauce and Tabasco sauce. Premade or packaged marinades. Premade or packaged taco seasonings. Relishes. Regular salad dressings. Where to find more information:  National Heart, Lung, and Blood Institute: www.nhlbi.nih.gov  American Heart Association: www.heart.org Summary  The DASH eating plan is a healthy eating plan that has been shown to reduce high blood pressure (hypertension). It may also reduce your risk for type 2 diabetes, heart disease, and stroke.  With the DASH eating plan, you should limit salt (sodium) intake to 2,300 mg a day. If you have hypertension, you may need to reduce your sodium intake to 1,500 mg a day.  When on the DASH eating plan, aim to eat more fresh fruits and vegetables, whole grains, lean proteins, low-fat dairy, and heart-healthy fats.  Work with your health care provider or diet and nutrition specialist (dietitian) to adjust your eating plan to your   individual calorie needs. This information is not intended to replace advice given to you by your health care provider. Make sure you discuss any questions you have with your health care provider. Document Revised: 11/25/2017 Document Reviewed: 12/06/2016 Elsevier Patient Education  2020 Elsevier Inc.  

## 2020-02-06 NOTE — Progress Notes (Signed)
Name: Larod Bargman   MRN: [id number]    DOB: 1966/12/21   Date:02/06/2020       Progress Note  Chief Complaint  Patient presents with  . Follow-up  . Diabetes    wants foot exam, does see endo  . Hypertension  . Hyperlipidemia     Subjective:   Sruly Bressi is a 54 y.o. male, presents to clinic for routine follow up on the conditions listed above.  Pt requests foot exam DM managed by endocrinology - [facility], Dr. Gabriel Carina Start [facility] Inject 1 mg once per week.   Start pioglitazone, take 1 tablet once daily.   Continue Xigduo and glipizide.   Most recent visit was 01/07/2020 BP was high and Dr. Gabriel Carina adjusted norvasc to 10 mg  DM still uncontrolled last A1C 01/07/2020 was 8.2  Dr. Zigmund Daniel in [facility] pt sees everymonth  Hypertension:  Has been on BP meds  Currently managed on amlodipine, lisinopril 40 mg HCTZ Pt reports good med compliance and denies any SE.  No lightheadedness, hypotension, syncope. Blood pressure today is not controlled, slightly above goal, pt very very anxious   Last visit BP at endocrine was 164/98 norvasc was increased from 5 to 10 mg BP Readings from Last 3 Encounters:  02/06/20 (!) 140/92  01/29/19 130/88  12/14/18 (!) 146/79  134/92 with repeat BP Pt denies CP, SOB, exertional sx, LE edema, palpitation, Ha's, visual disturbances He has not previously tried and been instructed to try any spec diet for HTN  Hyperlipidemia: Current Medication Regimen: lipitor 40 mg  Last Lipids: Lab Results  Component Value Date   CHOL 107 01/29/2019   HDL 34 (L) 01/29/2019   LDLCALC 51 01/29/2019   TRIG 140 01/29/2019   CHOLHDL 3.1 01/29/2019  - Current Diet:  No efforts right now, bacon, asks "what should I be eating?"   - Denies: Chest pain, shortness of breath, myalgias. - Documented aortic atherosclerosis? No - Risk factors for atherosclerosis: diabetes mellitus, hypercholesterolemia and hypertension   DM foot  exam today, some thickened skin to souls right great nail is thick and yellow - doesn't bother him, no foot pain, calf pain.  Stable weight, obese, he is walking 5x a week, no other exercise, no specific foods or calorie goal, hasn't tried to loose weight. Wt Readings from Last 5 Encounters:  02/06/20 290 lb 11.2 oz (131.9 kg)  01/29/19 297 lb 14.4 oz (135.1 kg)  12/14/18 299 lb 1.6 oz (135.7 kg)  09/29/18 295 lb 1.6 oz (133.9 kg)  07/24/18 290 lb (131.5 kg)     Patient Active Problem List   Diagnosis Date Noted  . 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 Vitamin D deficiency 01/29/2019  . Sleep apnea 09/29/2018  . Umbilical hernia without obstruction and without gangrene 12/31/2016  . Ventral hernia without obstruction or gangrene 12/31/2016  . Low testosterone in male 10/22/2016  . Uncontrolled type 2 diabetes mellitus with peripheral circulatory disorder ([place]) 06/13/2015  . Hyperlipidemia LDL goal <70 06/13/2015  . Hypertension goal BP (blood pressure) < 130/80 06/13/2015  . Recurrent boils 06/13/2015  . Erectile dysfunction associated with type 2 diabetes mellitus ([place]) 02/20/2015  . Obesity, Class III, BMI 40-49.9 (morbid obesity) ([place]) 02/20/2015    Past Surgical History:  Procedure Laterality Date  . COLONOSCOPY  2013  . COLONOSCOPY WITH PROPOFOL N/A 06/02/2018   Procedure: COLONOSCOPY WITH PROPOFOL;  Surgeon: Jonathon Bellows, MD;  Location:  [facility] ENDOSCOPY;  Service: Gastroenterology;  Laterality: N/A;    Family History  Problem Relation Age of Onset  . Diabetes Mother   . Kidney disease Mother   . Diabetes Brother   . Diabetes Brother   . Diabetes Maternal Grandfather   . Hypertension Maternal Grandfather   . Stroke Maternal Grandfather     Social History   Socioeconomic History  . Marital status: Married    Spouse name: Jeanett Schlein   . Number of children: 3  . Years of education: Not on file  . Highest  education level: Not on file  Occupational History  . Not on file  Tobacco Use  . Smoking status: Never Smoker  . Smokeless tobacco: Never Used  Substance and Sexual Activity  . Alcohol use: No    Alcohol/week: 0.0 standard drinks  . Drug use: No  . Sexual activity: Yes    Partners: Female  Other Topics Concern  . Not on file  Social History Narrative  . Not on file   Social Determinants of Health   Financial Resource Strain:   . Difficulty of Paying Living Expenses: Not on file  Food Insecurity:   . Worried About Charity fundraiser in the Last Year: Not on file  . Ran Out of Food in the Last Year: Not on file  Transportation Needs:   . Lack of Transportation (Medical): Not on file  . Lack of Transportation (Non-Medical): Not on file  Physical Activity:   . Days of Exercise per Week: Not on file  . Minutes of Exercise per Session: Not on file  Stress:   . Feeling of Stress : Not on file  Social Connections:   . Frequency of Communication with Friends and Family: Not on file  . Frequency of Social Gatherings with Friends and Family: Not on file  . Attends Religious Services: Not on file  . Active Member of Clubs or Organizations: Not on file  . Attends Archivist Meetings: Not on file  . Marital Status: Not on file  Intimate Partner Violence:   . Fear of Current or Ex-Partner: Not on file  . Emotionally Abused: Not on file  . Physically Abused: Not on file  . Sexually Abused: Not on file     Current Outpatient Medications:  .  amLODipine (NORVASC) 10 MG tablet, Take by mouth., Disp: , Rfl:  .  atorvastatin (LIPITOR) 40 MG tablet, Take 1 tablet (40 mg total) by mouth daily at 6 PM., Disp: 90 tablet, Rfl: 1 .  glipiZIDE (GLUCOTROL XL) 10 MG 24 hr tablet, Take 2 tablets (20 mg total) by mouth daily., Disp: , Rfl:  .  hydrochlorothiazide (HYDRODIURIL) 25 MG tablet, Take 1 tablet (25 mg total) by mouth daily., Disp: 90 tablet, Rfl: 1 .  lisinopril (ZESTRIL) 40  MG tablet, Take 1 tablet (40 mg total) by mouth daily. (for kidney protection and blood pressure), Disp: 90 tablet, Rfl: 1 .  OZEMPIC, 1 MG/DOSE, 2 MG/1.5ML SOPN, INJECT 0.75 MLS (1 MG TOTAL) SUBCUTANEOUSLY ONCE A WEEK FOR 30 DAYS, Disp: , Rfl:  .  pioglitazone (ACTOS) 30 MG tablet, Take 30 mg by mouth daily., Disp: , Rfl:  .  prednisoLONE acetate (PRED FORTE) 1 % ophthalmic suspension, INSTILL 1 DROP IN THE RIGHT EYE 3 TIMES A DAY FOR 5 DAYS THEN DISCONTINUE, Disp: , Rfl: 6 .  XIGDUO XR 04-999 MG TB24, Take 2 tablets by mouth daily., Disp: , Rfl: 11 .  busPIRone (BUSPAR) 5  MG tablet, Take 1 tablet (5 mg total) by mouth 2 (two) times daily as needed., Disp: 60 tablet, Rfl: 0 .  glucose blood (ONE TOUCH ULTRA TEST) test strip, Use as instructed, Disp: 300 each, Rfl: 3 .  Insulin Degludec 200 UNIT/ML SOPN, Inject into the skin., Disp: , Rfl:  .  Insulin Pen Needle (B-D UF III MINI PEN NEEDLES) 31G X 5 MM MISC, FOR USE WITH INJECTABLE MEDICINE, ONCE A DAY, Disp: 90 each, Rfl: 3 .  ONETOUCH DELICA LANCETS FINE MISC, 1 Device by Does not apply route 3 (three) times daily., Disp: 300 each, Rfl: 3  No Known Allergies  Chart Review Today: I personally reviewed active problem list, medication list, allergies, family history, social history, health maintenance, notes from last encounter, lab results, imaging with the patient/caregiver today.  Review of Systems  Constitutional: Negative.   HENT: Negative.   Eyes: Negative.   Respiratory: Negative.   Cardiovascular: Negative.   Gastrointestinal: Negative.   Endocrine: Negative.   Genitourinary: Negative.   Musculoskeletal: Negative.   Skin: Negative.   Allergic/Immunologic: Negative.   Neurological: Negative.   Hematological: Negative.   Psychiatric/Behavioral: Negative for agitation, behavioral problems, decreased concentration, dysphoric mood and sleep disturbance. The patient is nervous/anxious.   All other systems reviewed and are negative.      Objective:    Vitals:   02/06/20 1551  BP: (!) 140/92  Pulse: 96  Resp: 14  Temp: 97.8 F (36.6 C)  SpO2: 98%  Weight: 290 lb 11.2 oz (131.9 kg)  Height: 5\' 10"  (1.778 m)    Body mass index is 41.71 kg/m.  Physical Exam Vitals and nursing note reviewed.  Constitutional:      General: He is not in acute distress.    Appearance: Normal appearance. He is well-developed. He is obese. He is not ill-appearing, toxic-appearing or diaphoretic.     Interventions: Face mask in place.  HENT:     Head: Normocephalic and atraumatic.     Jaw: No trismus.     Right Ear: External ear normal.     Left Ear: External ear normal.  Eyes:     General: Lids are normal. No scleral icterus.    Conjunctiva/sclera: Conjunctivae normal.     Pupils: Pupils are equal, round, and reactive to light.  Neck:     Trachea: Trachea and phonation normal. No tracheal deviation.  Cardiovascular:     Rate and Rhythm: Normal rate and regular rhythm.  No extrasystoles are present.    Chest Wall: No thrill.     Pulses: Normal pulses.          Radial pulses are 2+ on the right side and 2+ on the left side.       Posterior tibial pulses are 2+ on the right side and 2+ on the left side.     Heart sounds: Normal heart sounds. No murmur. No friction rub. No gallop.   Pulmonary:     Effort: Pulmonary effort is normal. No respiratory distress.     Breath sounds: Normal breath sounds. No stridor. No wheezing, rhonchi or rales.  Abdominal:     General: Bowel sounds are normal. There is no distension.     Palpations: Abdomen is soft.     Tenderness: There is no abdominal tenderness. There is no guarding or rebound.  Musculoskeletal:        General: Normal range of motion.     Cervical back: Normal range of motion and neck supple.  Right lower leg: 1+ Edema present.     Left lower leg: 1+ Edema present.  Feet:     Right foot:     Skin integrity: Skin integrity normal.     Toenail Condition: Right toenails  are normal.     Left foot:     Skin integrity: Skin integrity normal.     Toenail Condition: Fungal disease present.    Comments: See DM foot exam right great toenail with fungal disease Skin:    General: Skin is warm and dry.     Capillary Refill: Capillary refill takes less than 2 seconds.     Coloration: Skin is not jaundiced.     Findings: No rash.     Nails: There is no clubbing.  Neurological:     Mental Status: He is alert.     Cranial Nerves: No dysarthria or facial asymmetry.     Motor: No weakness, tremor or abnormal muscle tone.     Coordination: Coordination normal.     Gait: Gait normal.  Psychiatric:        Attention and Perception: Attention normal.        Mood and Affect: Mood is anxious. Mood is not depressed.        Speech: Speech normal.        Behavior: Behavior normal. Behavior is cooperative.        Thought Content: Thought content normal.       Diabetic Foot Exam: Diabetic Foot Exam - Simple   Simple Foot Form Diabetic Foot exam was performed with the following findings: Yes 02/06/2020  4:41 PM  Visual Inspection Sensation Testing Pulse Check Comments     PHQ2/9: Depression screen [facility] 2/9 02/06/2020 08/06/2019 01/29/2019 09/29/2018 06/23/2018  Decreased Interest 0 0 0 0 0  Down, Depressed, Hopeless 0 0 0 0 0  PHQ - 2 Score 0 0 0 0 0  Altered sleeping 0 0 0 0 -  Tired, decreased energy 0 0 0 0 -  Change in appetite 0 0 0 0 -  Feeling bad or failure about yourself  0 0 0 0 -  Trouble concentrating 0 0 0 0 -  Moving slowly or fidgety/restless 0 0 0 0 -  Suicidal thoughts 0 0 - 0 -  PHQ-9 Score 0 0 0 0 -  Difficult doing work/chores Not difficult at all - Not difficult at all Not difficult at all -    phq 9 is neg, reviewed today  Fall Risk: Fall Risk  02/06/2020 08/06/2019 01/29/2019 09/29/2018 06/23/2018  Falls in the past year? 0 0 0 No No  Number falls in past yr: 0 0 - - -  Injury with Fall? 0 0 - - -    Functional Status Survey: Is the  patient deaf or have difficulty hearing?: No Does the patient have difficulty seeing, even when wearing glasses/contacts?: No Does the patient have difficulty concentrating, remembering, or making decisions?: No Does the patient have difficulty walking or climbing stairs?: No Does the patient have difficulty dressing or bathing?: No Does the patient have difficulty doing errands alone such as visiting a doctor's office or shopping?: No   Assessment & Plan:     ICD-10-CM   1. Hypertension goal BP (blood pressure) < 130/80  99991111 COMPLETE METABOLIC PANEL WITH GFR    busPIRone (BUSPAR) 5 MG tablet   Near Goal, I suspect anxiety is falsly elevating, continue same meds, low salt diet (DASH), improved diet and exercise - handout  given  2. Hyperlipidemia LDL goal <70  E78.5 Lipid panel    COMPLETE METABOLIC PANEL WITH GFR   well controlled, tolerating statin, compliant, no myalgias, recheck lipid panel  3. Obstructive sleep apnea syndrome  G47.33    compliant with CPAP  4. Medication monitoring encounter  Z51.81 Lipid panel    COMPLETE METABOLIC PANEL WITH GFR  5. Situational anxiety  F41.8    buspar trial as needed for situational stress, anxiety is elevated BP, overall BP appears well controlled  6. Erectile dysfunction associated with type 2 diabetes mellitus (HCC) Chronic E11.69    N52.1   7. Obesity, Class III, BMI 40-49.9 (morbid obesity) (HCC) Chronic E66.01    discussed diet/nutrition, calorie deficit, increased exercise for small weight loss goals  8. Uncontrolled type 2 diabetes mellitus with peripheral circulatory disorder (HCC) Chronic E11.51    E11.65    per endo - not checking CBGs, encouraged monitoring fasting CBGs, goal of 80-130 for control, foot exam done today  9. 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  Return in about 3 months (around 05/05/2020) for BP recheck.   Delsa Grana, PA-C 02/06/20 4:43 PM

## 2020-02-28 ENCOUNTER — Other Ambulatory Visit: Payer: Self-pay | Admitting: Family Medicine

## 2020-02-28 DIAGNOSIS — I10 Essential (primary) hypertension: Secondary | ICD-10-CM

## 2020-02-28 NOTE — Telephone Encounter (Signed)
LOV; 02/06/20

## 2020-02-29 ENCOUNTER — Encounter (INDEPENDENT_AMBULATORY_CARE_PROVIDER_SITE_OTHER): Payer: BC Managed Care – PPO | Admitting: Ophthalmology

## 2020-02-29 ENCOUNTER — Other Ambulatory Visit: Payer: Self-pay

## 2020-02-29 DIAGNOSIS — E113313 Type 2 diabetes mellitus with moderate nonproliferative diabetic retinopathy with macular edema, bilateral: Secondary | ICD-10-CM | POA: Diagnosis not present

## 2020-02-29 DIAGNOSIS — I10 Essential (primary) hypertension: Secondary | ICD-10-CM | POA: Diagnosis not present

## 2020-02-29 DIAGNOSIS — H35033 Hypertensive retinopathy, bilateral: Secondary | ICD-10-CM | POA: Diagnosis not present

## 2020-02-29 DIAGNOSIS — E11311 Type 2 diabetes mellitus with unspecified diabetic retinopathy with macular edema: Secondary | ICD-10-CM | POA: Diagnosis not present

## 2020-02-29 DIAGNOSIS — H43813 Vitreous degeneration, bilateral: Secondary | ICD-10-CM

## 2020-03-02 ENCOUNTER — Ambulatory Visit: Payer: Self-pay | Attending: Internal Medicine

## 2020-03-02 DIAGNOSIS — Z23 Encounter for immunization: Secondary | ICD-10-CM | POA: Insufficient documentation

## 2020-03-02 NOTE — Progress Notes (Signed)
   Covid-19 Vaccination Clinic  Name:  Clarence Black    MRN: [id number] DOB: [birth year]  03/02/2020  [name] was observed post Covid-19 immunization for 15 minutes without incident. He was provided with Vaccine Information Sheet and instruction to access the V-Safe system.   [name] was instructed to call 911 with any severe reactions post vaccine: Marland Kitchen Difficulty breathing  . Swelling of face and throat  . A fast heartbeat  . A bad rash all over body  . Dizziness and weakness   Immunizations Administered    Name Date Dose VIS Date Route   Pfizer COVID-19 Vaccine 03/02/2020  8:21 AM 0.3 mL 12/07/2019 Intramuscular   Manufacturer: [place]   Lot: [id number]   [place]: [id number]

## 2020-03-07 ENCOUNTER — Other Ambulatory Visit: Payer: Self-pay | Admitting: Family Medicine

## 2020-03-07 DIAGNOSIS — I10 Essential (primary) hypertension: Secondary | ICD-10-CM

## 2020-03-07 NOTE — Telephone Encounter (Signed)
Refill request for general medication.  Last office visit:02/06/20  No follow-ups on file.

## 2020-03-24 ENCOUNTER — Ambulatory Visit: Payer: Self-pay | Attending: Internal Medicine

## 2020-03-24 DIAGNOSIS — Z23 Encounter for immunization: Secondary | ICD-10-CM

## 2020-03-24 NOTE — Progress Notes (Signed)
   Covid-19 Vaccination Clinic  Name:  [name]    MRN: [id number] DOB: 14-Feb-1966  03/24/2020  Clarence Black was observed post Covid-19 immunization for 15 minutes without incident. He was provided with Vaccine Information Sheet and instruction to access the V-Safe system.   Mr. Magouirk was instructed to call 911 with any severe reactions post vaccine: Marland Kitchen Difficulty breathing  . Swelling of face and throat  . A fast heartbeat  . A bad rash all over body  . Dizziness and weakness   Immunizations Administered    Name Date Dose VIS Date Route   Pfizer COVID-19 Vaccine 03/24/2020  8:10 AM 0.3 mL 12/07/2019 Intramuscular   Manufacturer: [employer], [employer]   Lot: [id number]   [place]: [id number]

## 2020-04-04 ENCOUNTER — Encounter (INDEPENDENT_AMBULATORY_CARE_PROVIDER_SITE_OTHER): Payer: BC Managed Care – PPO | Admitting: Ophthalmology

## 2020-04-04 DIAGNOSIS — H43813 Vitreous degeneration, bilateral: Secondary | ICD-10-CM

## 2020-04-04 DIAGNOSIS — E113313 Type 2 diabetes mellitus with moderate nonproliferative diabetic retinopathy with macular edema, bilateral: Secondary | ICD-10-CM

## 2020-04-04 DIAGNOSIS — E11311 Type 2 diabetes mellitus with unspecified diabetic retinopathy with macular edema: Secondary | ICD-10-CM | POA: Diagnosis not present

## 2020-04-04 DIAGNOSIS — I10 Essential (primary) hypertension: Secondary | ICD-10-CM | POA: Diagnosis not present

## 2020-04-04 DIAGNOSIS — H2513 Age-related nuclear cataract, bilateral: Secondary | ICD-10-CM

## 2020-04-04 DIAGNOSIS — H35033 Hypertensive retinopathy, bilateral: Secondary | ICD-10-CM

## 2020-04-06 ENCOUNTER — Other Ambulatory Visit: Payer: Self-pay | Admitting: Family Medicine

## 2020-04-06 DIAGNOSIS — I10 Essential (primary) hypertension: Secondary | ICD-10-CM

## 2020-04-21 ENCOUNTER — Other Ambulatory Visit: Payer: Self-pay

## 2020-04-21 MED ORDER — AMLODIPINE BESYLATE 10 MG PO TABS: 10.0000 mg | ORAL_TABLET | Freq: Every day | ORAL | 0 refills | Status: DC

## 2020-04-24 ENCOUNTER — Other Ambulatory Visit: Payer: Self-pay

## 2020-04-24 DIAGNOSIS — I10 Essential (primary) hypertension: Secondary | ICD-10-CM

## 2020-04-24 MED ORDER — LISINOPRIL 40 MG PO TABS: 40.0000 mg | ORAL_TABLET | Freq: Every day | ORAL | 3 refills | Status: DC

## 2020-04-29 ENCOUNTER — Encounter: Payer: Self-pay | Admitting: Family Medicine

## 2020-04-29 ENCOUNTER — Ambulatory Visit (INDEPENDENT_AMBULATORY_CARE_PROVIDER_SITE_OTHER): Payer: BC Managed Care – PPO | Admitting: Family Medicine

## 2020-04-29 ENCOUNTER — Other Ambulatory Visit: Payer: Self-pay

## 2020-04-29 VITALS — BP 140/80 | HR 98 | Temp 96.8°F | Resp 16 | Ht 69.5 in | Wt 284.3 lb

## 2020-04-29 DIAGNOSIS — E1151 Type 2 diabetes mellitus with diabetic peripheral angiopathy without gangrene: Secondary | ICD-10-CM

## 2020-04-29 DIAGNOSIS — I10 Essential (primary) hypertension: Secondary | ICD-10-CM

## 2020-04-29 DIAGNOSIS — N400 Enlarged prostate without lower urinary tract symptoms: Secondary | ICD-10-CM

## 2020-04-29 DIAGNOSIS — K429 Umbilical hernia without obstruction or gangrene: Secondary | ICD-10-CM

## 2020-04-29 DIAGNOSIS — E559 Vitamin D deficiency, unspecified: Secondary | ICD-10-CM

## 2020-04-29 DIAGNOSIS — N528 Other male erectile dysfunction: Secondary | ICD-10-CM

## 2020-04-29 DIAGNOSIS — IMO0002 Reserved for concepts with insufficient information to code with codable children: Secondary | ICD-10-CM

## 2020-04-29 DIAGNOSIS — R9431 Abnormal electrocardiogram [ECG] [EKG]: Secondary | ICD-10-CM

## 2020-04-29 DIAGNOSIS — Z0000 Encounter for general adult medical examination without abnormal findings: Secondary | ICD-10-CM

## 2020-04-29 DIAGNOSIS — N138 Other obstructive and reflux uropathy: Secondary | ICD-10-CM | POA: Insufficient documentation

## 2020-04-29 DIAGNOSIS — Z1159 Encounter for screening for other viral diseases: Secondary | ICD-10-CM | POA: Diagnosis not present

## 2020-04-29 MED ORDER — BUSPIRONE HCL 5 MG PO TABS: 5.0000 mg | ORAL_TABLET | Freq: Two times a day (BID) | ORAL | 1 refills | Status: DC | PRN

## 2020-04-29 MED ORDER — ATORVASTATIN CALCIUM 40 MG PO TABS: 40.0000 mg | ORAL_TABLET | Freq: Every day | ORAL | 3 refills | Status: DC

## 2020-04-29 MED ORDER — OLMESARTAN MEDOXOMIL 40 MG PO TABS: 40.0000 mg | ORAL_TABLET | Freq: Every day | ORAL | 1 refills | Status: DC

## 2020-04-29 NOTE — Progress Notes (Signed)
Name: Clarence Black   MRN: [id number]    DOB: 02/21/1966   Date:04/29/2020       Progress Note  Subjective  Chief Complaint  Chief Complaint  Patient presents with  . 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 We will change from lisinopril to Benicar 40 mg to get bp below 130/80. 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 , truck driver  Exercise: encouraged increasing to 150 minutes per week   Depression: phq 9 is negative Depression screen [facility] 2/9 04/29/2020 04/29/2020 02/06/2020 08/06/2019 01/29/2019  Decreased Interest 0 0 0 0 0  Down, Depressed, Hopeless 0 0 0 0 0  PHQ - 2 Score 0 0 0 0 0  Altered sleeping 1 0 0 0 0  Tired, decreased energy 1 0 0 0 0  Change in appetite 0 0 0 0 0  Feeling bad or failure about yourself  0 0 0 0 0  Trouble concentrating 0 0 0 0 0  Moving slowly or fidgety/restless 0 0 0 0 0  Suicidal thoughts 0 0 0 0 -  PHQ-9 Score 2 0 0 0 0  Difficult doing work/chores Not difficult at all - Not difficult at all - Not difficult at all    Hypertension:  BP Readings from Last 3 Encounters:  04/29/20 140/80  02/06/20 (!) 140/92  01/29/19 130/88    Obesity: Wt Readings from Last 3 Encounters:  04/29/20 284 lb 4.8 oz (129 kg)  02/06/20 290 lb 11.2 oz (131.9 kg)  01/29/19 297 lb 14.4 oz (135.1 kg)   BMI Readings from Last 3 Encounters:  04/29/20 41.38 kg/m  02/06/20 41.71 kg/m  01/29/19 42.74 kg/m     Lipids:  Lab Results  Component Value Date   CHOL 139 02/06/2020   CHOL 107 01/29/2019   CHOL 130 06/23/2018   Lab Results  Component Value Date   HDL 39 (L) 02/06/2020   HDL 34 (L) 01/29/2019   HDL 35 (L) 06/23/2018   Lab Results  Component Value Date   LDLCALC 81 02/06/2020   LDLCALC 51 01/29/2019   LDLCALC 62 06/23/2018   Lab Results  Component Value Date    TRIG 95 02/06/2020   TRIG 140 01/29/2019   TRIG 282 (H) 06/23/2018   Lab Results  Component Value Date   CHOLHDL 3.6 02/06/2020   CHOLHDL 3.1 01/29/2019   CHOLHDL 3.7 06/23/2018   No results found for: LDLDIRECT Glucose:  Glucose, Bld  Date Value Ref Range Status  02/06/2020 94 65 - 99 mg/dL Final    Comment:    .            Fasting reference interval .   01/29/2019 143 (H) 65 - 139 mg/dL Final    Comment:    .        Non-fasting reference interval .   06/23/2018 241 (H) 65 - 139 mg/dL Final    Comment:    .        Non-fasting reference interval .    Glucose-Capillary  Date Value Ref Range Status  06/02/2018 173 (H) 65 - 99 mg/dL Final      Office Visit from 02/06/2020 in [facility]  AUDIT-C Score  0      Married STD testing and  prevention (HIV/chl/gon/syphilis): not appicable Hep C: today   Skin cancer: Discussed monitoring for atypical lesions Colorectal cancer: repeat in 2029 Prostate cancer:  Lab Results  Component Value Date   PSA 0.3ng/dL 05/27/2015    IPSS Questionnaire (AUA-7): Over the past month.   1)  How often have you had a sensation of not emptying your bladder completely after you finish urinating?  0 - Not at all  2)  How often have you had to urinate again less than two hours after you finished urinating? 0 - Not at all  3)  How often have you found you stopped and started again several times when you urinated?  0 - Not at all  4) How difficult have you found it to postpone urination?  0 - Not at all  5) How often have you had a weak urinary stream?  0 - Not at all  6) How often have you had to push or strain to begin urination?  0 - Not at all  7) How many times did you most typically get up to urinate from the time you went to bed until the time you got up in the morning?  0 - None  Total score:  0-7 mildly symptomatic   8-19 moderately symptomatic   20-35 severely symptomatic    Lung cancer:   Low Dose CT  Chest recommended if Age 3-80 years, 30 pack-year currently smoking OR have quit w/in 15years. Patient does not qualify.   AAA:  The USPSTF recommends one-time screening with ultrasonography in men ages 37 to 8 years who have ever smoked ECG:  Today   Advanced Care Planning: A voluntary discussion about advance care planning including the explanation and discussion of advance directives.  Discussed health care proxy and Living will, and the patient was able to identify a health care proxy as wife.  Patient does not have a living will at present time.   Patient Active Problem List   Diagnosis Date Noted  . BPH with obstruction/lower urinary tract symptoms 04/29/2020  . 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 Vitamin D deficiency 01/29/2019  . Sleep apnea 09/29/2018  . Umbilical hernia without obstruction and without gangrene 12/31/2016  . Ventral hernia without obstruction or gangrene 12/31/2016  . Low testosterone in male 10/22/2016  . Hyperlipidemia LDL goal <70 06/13/2015  . Hypertension goal BP (blood pressure) < 130/80 06/13/2015  . ED (erectile dysfunction) of organic origin 02/20/2015  . Obesity, Class III, BMI 40-49.9 (morbid obesity) ([place]) 02/20/2015    Past Surgical History:  Procedure Laterality Date  . COLONOSCOPY  2013  . COLONOSCOPY WITH PROPOFOL N/A 06/02/2018   Procedure: COLONOSCOPY WITH PROPOFOL;  Surgeon: Jonathon Bellows, MD;  Location: [facility] ENDOSCOPY;  Service: Gastroenterology;  Laterality: N/A;    Family History  Problem Relation Age of Onset  . Diabetes Mother   . Kidney disease Mother   . Diabetes Brother   . Diabetes Brother   . Diabetes Maternal Grandfather   . Hypertension Maternal Grandfather   . Stroke Maternal Grandfather     Social History   Socioeconomic History  . Marital status: Married    Spouse name: Jeanett Schlein   . Number of children: 3  . Years of education: Not on file  . Highest  education level: Not on file  Occupational History  . Occupation: driver   Tobacco Use  . Smoking status: Never Smoker  . Smokeless tobacco: Never Used  Substance and Sexual Activity  . Alcohol use: No    Alcohol/week: 0.0 standard drinks  . Drug use: No  . Sexual activity: Yes    Partners: Female  Other Topics Concern  . Not on file  Social History Narrative  . Not on file   Social Determinants of Health   Financial Resource Strain: Low Risk   . Difficulty of Paying Living Expenses: Not hard at all  Food Insecurity: No Food Insecurity  . Worried About Charity fundraiser in the Last Year: Never true  . Ran Out of Food in the Last Year: Never true  Transportation Needs: No Transportation Needs  . Lack of Transportation (Medical): No  . Lack of Transportation (Non-Medical): No  Physical Activity: Insufficiently Active  . Days of Exercise per Week: 3 days  . Minutes of Exercise per Session: 30 min  Stress: No Stress Concern Present  . Feeling of Stress : Not at all  Social Connections: Not Isolated  . Frequency of Communication with Friends and Family: More than three times a week  . Frequency of Social Gatherings with Friends and Family: More than three times a week  . Attends Religious Services: More than 4 times per year  . Active Member of Clubs or Organizations: Yes  . Attends Archivist Meetings: More than 4 times per year  . Marital Status: Married  Human resources officer Violence: Not At Risk  . Fear of Current or Ex-Partner: No  . Emotionally Abused: No  . Physically Abused: No  . Sexually Abused: No     Current Outpatient Medications:  .  amLODipine (NORVASC) 10 MG tablet, Take 1 tablet (10 mg total) by mouth daily., Disp: 30 tablet, Rfl: 0 .  atorvastatin (LIPITOR) 40 MG tablet, Take 1 tablet (40 mg total) by mouth daily at 6 PM., Disp: 90 tablet, Rfl: 3 .  BESIVANCE 0.6 % SUSP, INSTILL ONE DROP INTO BOTH EYES 4 TIMES A DAY FOR 2 DAYS AFTER EACH MONTHLY  EYE INJECTION, Disp: , Rfl:  .  Dapagliflozin-metFORMIN HCl ER (XIGDUO XR) 04-999 MG TB24, Take 2 tablets by mouth daily., Disp: , Rfl:  .  glipiZIDE (GLUCOTROL XL) 10 MG 24 hr tablet, Take 1 tablet by mouth daily., Disp: , Rfl:  .  glucose blood (ONE TOUCH ULTRA TEST) test strip, Use as instructed, Disp: 300 each, Rfl: 3 .  hydrochlorothiazide (HYDRODIURIL) 25 MG tablet, Take 1 tablet (25 mg total) by mouth daily., Disp: 90 tablet, Rfl: 1 .  Insulin Pen Needle (B-D UF III MINI PEN NEEDLES) 31G X 5 MM MISC, FOR USE WITH INJECTABLE MEDICINE, ONCE A DAY, Disp: 90 each, Rfl: 3 .  ONETOUCH DELICA LANCETS FINE MISC, 1 Device by Does not apply route 3 (three) times daily., Disp: 300 each, Rfl: 3 .  OZEMPIC, 1 MG/DOSE, 2 MG/1.5ML SOPN, INJECT 0.75 MLS (1 MG TOTAL) SUBCUTANEOUSLY ONCE A WEEK FOR 30 DAYS, Disp: , Rfl:  .  pioglitazone (ACTOS) 30 MG tablet, Take 30 mg by mouth daily., Disp: , Rfl:  .  busPIRone (BUSPAR) 5 MG tablet, Take 1 tablet (5 mg total) by mouth 2 (two) times daily as needed., Disp: 60 tablet, Rfl: 1 .  olmesartan (BENICAR) 40 MG tablet, Take 1 tablet (40 mg total) by mouth daily. In place of lisinopril, Disp: 90 tablet, Rfl: 1  No Known Allergies   ROS  Constitutional: Negative for fever or weight change.  Respiratory: Negative for cough and shortness of breath.  Cardiovascular: Negative for chest pain or palpitations.  Gastrointestinal: Negative for abdominal pain, no bowel changes.  Musculoskeletal: Negative for gait problem or joint swelling.  Skin: Negative for rash.  Neurological: Negative for dizziness or headache.  No other specific complaints in a complete review of systems (except as listed in HPI above).   Objective  Vitals:   04/29/20 1430  BP: 140/80  Pulse: 98  Resp: 16  Temp: (!) 96.8 F (36 C)  TempSrc: Temporal  SpO2: 95%  Weight: 284 lb 4.8 oz (129 kg)  Height: 5' 9.5" (1.765 m)    Body mass index is 41.38 kg/m.  Physical  Exam  Constitutional: Patient appears well-developed and well-nourished, obese . No distress.  HENT: Head: Normocephalic and atraumatic. Ears: B TMs ok, no erythema or effusion; Nose: not done Mouth/Throat: not done Eyes: Conjunctivae and EOM are normal. Pupils are equal, round, and reactive to light. No scleral icterus.  Neck: Normal range of motion. Neck supple. No JVD present. No thyromegaly present.  Cardiovascular: Normal rate, regular rhythm and normal heart sounds.  No murmur heard. No BLE edema. Pulmonary/Chest: Effort normal and breath sounds normal. No respiratory distress. Abdominal: Soft. Bowel sounds are normal, no distension. There is no tenderness. no masses he has an umbilical hernia  MALE GENITALIA: Normal descended testes bilaterally, no masses palpated, no hernias, no lesions, no discharge RECTAL: Prostate slightly enlarged but normal  consistency, no rectal masses or hemorrhoids  Musculoskeletal: Normal range of motion, no joint effusions. No gross deformities Neurological: he is alert and oriented to person, place, and time. No cranial nerve deficit. Coordination, balance, strength, speech and gait are normal.  Skin: Skin is warm and dry. Dry feet   Psychiatric: Patient has a normal mood and affect. behavior is normal. Judgment and thought content normal.  Recent Results (from the past 2160 hour(s))  Lipid panel     Status: Abnormal   Collection Time: 02/06/20  4:34 PM  Result Value Ref Range   Cholesterol 139 <200 mg/dL   HDL 39 (L) > OR = 40 mg/dL   Triglycerides 95 <150 mg/dL   LDL Cholesterol (Calc) 81 mg/dL (calc)    Comment: Reference range: <100 . Desirable range <100 mg/dL for primary prevention;   <70 mg/dL for patients with CHD or diabetic patients  with > or = 2 CHD risk factors. Marland Kitchen LDL-C is now calculated using the Martin-Hopkins  calculation, which is a validated novel method providing  better accuracy than the Friedewald equation in the  estimation  of LDL-C.  Cresenciano Genre et al. Annamaria Helling. [id number]): 2061-2068  (http://education.[facility].com/faq/FAQ164)    Total CHOL/HDL Ratio 3.6 <5.0 (calc)   Non-HDL Cholesterol (Calc) 100 <130 mg/dL (calc)    Comment: For patients with diabetes plus 1 major ASCVD risk  factor, treating to a non-HDL-C goal of <100 mg/dL  (LDL-C of <70 mg/dL) is considered a therapeutic  option.   COMPLETE METABOLIC PANEL WITH GFR     Status: None   Collection Time: 02/06/20  4:34 PM  Result Value Ref Range   Glucose, Bld 94 65 - 99 mg/dL    Comment: .            Fasting reference interval .    BUN 16 7 - 25 mg/dL   Creat 1.06 0.70 - 1.33 mg/dL    Comment: For patients >50 years of age, the reference limit for Creatinine is approximately 13% higher for people identified as African-American. .    GFR,  Est Non African American 81 > OR = 60 mL/min/1.58m2   GFR, Est African American 94 > OR = 60 mL/min/1.44m2   BUN/Creatinine Ratio NOT APPLICABLE 6 - 22 (calc)   Sodium 138 135 - 146 mmol/L   Potassium 3.8 3.5 - 5.3 mmol/L   Chloride 104 98 - 110 mmol/L   CO2 27 20 - 32 mmol/L   Calcium 9.5 8.6 - 10.3 mg/dL   Total Protein 7.0 6.1 - 8.1 g/dL   Albumin 4.5 3.6 - 5.1 g/dL   Globulin 2.5 1.9 - 3.7 g/dL (calc)   AG Ratio 1.8 1.0 - 2.5 (calc)   Total Bilirubin 1.0 0.2 - 1.2 mg/dL   Alkaline phosphatase (APISO) 49 35 - 144 U/L   AST 21 10 - 35 U/L   ALT 14 9 - 46 U/L     PHQ2/9: Depression screen [facility] 2/9 04/29/2020 04/29/2020 02/06/2020 08/06/2019 01/29/2019  Decreased Interest 0 0 0 0 0  Down, Depressed, Hopeless 0 0 0 0 0  PHQ - 2 Score 0 0 0 0 0  Altered sleeping 1 0 0 0 0  Tired, decreased energy 1 0 0 0 0  Change in appetite 0 0 0 0 0  Feeling bad or failure about yourself  0 0 0 0 0  Trouble concentrating 0 0 0 0 0  Moving slowly or fidgety/restless 0 0 0 0 0  Suicidal thoughts 0 0 0 0 -  PHQ-9 Score 2 0 0 0 0  Difficult doing work/chores Not difficult at all - Not difficult at all - Not  difficult at all     Fall Risk: Fall Risk  04/29/2020 02/06/2020 08/06/2019 01/29/2019 09/29/2018  Falls in the past year? 0 0 0 0 No  Number falls in past yr: 0 0 0 - -  Injury with Fall? 0 0 0 - -    Functional Status Survey: Is the patient deaf or have difficulty hearing?: No Does the patient have difficulty seeing, even when wearing glasses/contacts?: No Does the patient have difficulty concentrating, remembering, or making decisions?: No Does the patient have difficulty walking or climbing stairs?: No Does the patient have difficulty dressing or bathing?: No Does the patient have difficulty doing errands alone such as visiting a doctor's office or shopping?: No   Assessment & Plan  1. Well adult exam  - PSA  2. BPH without urinary obstruction  - PSA  3. Hypertension goal BP (blood pressure) < 130/80  - busPIRone (BUSPAR) 5 MG tablet; Take 1 tablet (5 mg total) by mouth 2 (two) times daily as needed.  Dispense: 60 tablet; Refill: 1 - olmesartan (BENICAR) 40 MG tablet; Take 1 tablet (40 mg total) by mouth daily. In place of lisinopril  Dispense: 90 tablet; Refill: 1  4. Need for hepatitis C screening test  - Hepatitis C antibody  5. Vitamin D deficiency  - VITAMIN D 25 Hydroxy (Vit-D Deficiency, Fractures)  6. Other male erectile dysfunction  He tried medications and injections but the first did not work and injections were too costly   7. Abnormal EKG  - Ambulatory referral to Cardiology  8. Umbilical hernia without obstruction and without gangrene   -Prostate cancer screening and PSA options (with potential risks and benefits of testing vs not testing) were discussed along with recent recs/guidelines. -USPSTF grade A and B recommendations reviewed with patient; age-appropriate recommendations, preventive care, screening tests, etc discussed and encouraged; healthy living encouraged; see AVS for patient education given to patient -  Discussed importance of 150  minutes of physical activity weekly, eat two servings of fish weekly, eat one serving of tree nuts ( cashews, pistachios, pecans, almonds.Marland Kitchen) every other day, eat 6 servings of fruit/vegetables daily and drink plenty of water and avoid sweet beverages.

## 2020-04-29 NOTE — Patient Instructions (Addendum)
Shingrix vaccine, call and confirm coverage with insurance  Stop Lisinopril and start Olmasartan 40 mg for bp   Preventive Care 48-52 Years Old, Male Preventive care refers to lifestyle choices and visits with your health care provider that can promote health and wellness. This includes:  A yearly physical exam. This is also called an annual well check.  Regular dental and eye exams.  Immunizations.  Screening for certain conditions.  Healthy lifestyle choices, such as eating a healthy diet, getting regular exercise, not using drugs or products that contain nicotine and tobacco, and limiting alcohol use. What can I expect for my preventive care visit? Physical exam Your health care provider will check:  Height and weight. These may be used to calculate body mass index (BMI), which is a measurement that tells if you are at a healthy weight.  Heart rate and blood pressure.  Your skin for abnormal spots. Counseling Your health care provider may ask you questions about:  Alcohol, tobacco, and drug use.  Emotional well-being.  Home and relationship well-being.  Sexual activity.  Eating habits.  Work and work Statistician. What immunizations do I need?  Influenza (flu) vaccine  This is recommended every year. Tetanus, diphtheria, and pertussis (Tdap) vaccine  You may need a Td booster every 10 years. Varicella (chickenpox) vaccine  You may need this vaccine if you have not already been vaccinated. Zoster (shingles) vaccine  You may need this after age 75. Measles, mumps, and rubella (MMR) vaccine  You may need at least one dose of MMR if you were born in 1957 or later. You may also need a second dose. Pneumococcal conjugate (PCV13) vaccine  You may need this if you have certain conditions and were not previously vaccinated. Pneumococcal polysaccharide (PPSV23) vaccine  You may need one or two doses if you smoke cigarettes or if you have certain  conditions. Meningococcal conjugate (MenACWY) vaccine  You may need this if you have certain conditions. Hepatitis A vaccine  You may need this if you have certain conditions or if you travel or work in places where you may be exposed to hepatitis A. Hepatitis B vaccine  You may need this if you have certain conditions or if you travel or work in places where you may be exposed to hepatitis B. Haemophilus influenzae type b (Hib) vaccine  You may need this if you have certain risk factors. Human papillomavirus (HPV) vaccine  If recommended by your health care provider, you may need three doses over 6 months. You may receive vaccines as individual doses or as more than one vaccine together in one shot (combination vaccines). Talk with your health care provider about the risks and benefits of combination vaccines. What tests do I need? Blood tests  Lipid and cholesterol levels. These may be checked every 5 years, or more frequently if you are over 56 years old.  Hepatitis C test.  Hepatitis B test. Screening  Lung cancer screening. You may have this screening every year starting at age 2 if you have a 30-pack-year history of smoking and currently smoke or have quit within the past 15 years.  Prostate cancer screening. Recommendations will vary depending on your family history and other risks.  Colorectal cancer screening. All adults should have this screening starting at age 41 and continuing until age 92. Your health care provider may recommend screening at age 83 if you are at increased risk. You will have tests every 1-10 years, depending on your results and the type  of screening test.  Diabetes screening. This is done by checking your blood sugar (glucose) after you have not eaten for a while (fasting). You may have this done every 1-3 years.  Sexually transmitted disease (STD) testing. Follow these instructions at home: Eating and drinking  Eat a diet that includes fresh  fruits and vegetables, whole grains, lean protein, and low-fat dairy products.  Take vitamin and mineral supplements as recommended by your health care provider.  Do not drink alcohol if your health care provider tells you not to drink.  If you drink alcohol: ? Limit how much you have to 0-2 drinks a day. ? Be aware of how much alcohol is in your drink. In the [place] one drink equals one 12 oz bottle of beer (355 mL), one 5 oz glass of wine (148 mL), or one 1 oz glass of hard liquor (44 mL). Lifestyle  Take daily care of your teeth and gums.  Stay active. Exercise for at least 30 minutes on 5 or more days each week.  Do not use any products that contain nicotine or tobacco, such as cigarettes, e-cigarettes, and chewing tobacco. If you need help quitting, ask your health care provider.  If you are sexually active, practice safe sex. Use a condom or other form of protection to prevent STIs (sexually transmitted infections).  Talk with your health care provider about taking a low-dose aspirin every day starting at age 26. What's next?  Go to your health care provider once a year for a well check visit.  Ask your health care provider how often you should have your eyes and teeth checked.  Stay up to date on all vaccines. This information is not intended to replace advice given to you by your health care provider. Make sure you discuss any questions you have with your health care provider. Document Revised: 12/07/2018 Document Reviewed: 12/07/2018 Elsevier Patient Education  2020 [employer].

## 2020-04-30 LAB — HEPATITIS C ANTIBODY
Hepatitis C Ab: NONREACTIVE
SIGNAL TO CUT-OFF: 0 (ref <1.00)

## 2020-04-30 LAB — VITAMIN D 25 HYDROXY (VIT D DEFICIENCY, FRACTURES): Vit D, 25-Hydroxy: 14 ng/mL — ABNORMAL LOW (ref 30–100)

## 2020-04-30 LAB — PSA: PSA: 0.3 ng/mL (ref <=4.0)

## 2020-05-01 ENCOUNTER — Other Ambulatory Visit: Payer: Self-pay | Admitting: Family Medicine

## 2020-05-01 DIAGNOSIS — E559 Vitamin D deficiency, unspecified: Secondary | ICD-10-CM

## 2020-05-01 MED ORDER — VITAMIN D (ERGOCALCIFEROL) 1.25 MG (50000 UNIT) PO CAPS: 50000.0000 [IU] | ORAL_CAPSULE | ORAL | 1 refills | Status: DC

## 2020-05-05 ENCOUNTER — Encounter (INDEPENDENT_AMBULATORY_CARE_PROVIDER_SITE_OTHER): Payer: BC Managed Care – PPO | Admitting: Ophthalmology

## 2020-05-05 ENCOUNTER — Other Ambulatory Visit: Payer: Self-pay

## 2020-05-05 ENCOUNTER — Ambulatory Visit (INDEPENDENT_AMBULATORY_CARE_PROVIDER_SITE_OTHER): Payer: BC Managed Care – PPO | Admitting: Cardiovascular Disease

## 2020-05-05 ENCOUNTER — Encounter: Payer: Self-pay | Admitting: Cardiovascular Disease

## 2020-05-05 VITALS — BP 142/90 | HR 94 | Ht 69.5 in | Wt 288.4 lb

## 2020-05-05 DIAGNOSIS — I10 Essential (primary) hypertension: Secondary | ICD-10-CM | POA: Diagnosis not present

## 2020-05-05 DIAGNOSIS — E113313 Type 2 diabetes mellitus with moderate nonproliferative diabetic retinopathy with macular edema, bilateral: Secondary | ICD-10-CM

## 2020-05-05 DIAGNOSIS — R9431 Abnormal electrocardiogram [ECG] [EKG]: Secondary | ICD-10-CM

## 2020-05-05 DIAGNOSIS — E782 Mixed hyperlipidemia: Secondary | ICD-10-CM | POA: Diagnosis not present

## 2020-05-05 DIAGNOSIS — H35033 Hypertensive retinopathy, bilateral: Secondary | ICD-10-CM

## 2020-05-05 DIAGNOSIS — H43813 Vitreous degeneration, bilateral: Secondary | ICD-10-CM

## 2020-05-05 DIAGNOSIS — E11311 Type 2 diabetes mellitus with unspecified diabetic retinopathy with macular edema: Secondary | ICD-10-CM

## 2020-05-05 NOTE — Patient Instructions (Addendum)

## 2020-05-05 NOTE — Progress Notes (Signed)
Cardiology Office Note  Date:  05/05/2020   ID:  Clarence Black, DOB [birth year], MRN [id number]  PCP:  Clarence Sizer, MD   Chief Complaint  Patient presents with  . New Patient (Initial Visit)    Ref by Dr. Ancil Black for abnormal EKG. Meds reviewed by the pt. verbally. "doing well."     HPI:  [name] is a 54 year old gentleman with past medical history of Essential hypertension Type 2 diabetes with complications/retinopathy Hyperlipidemia Who presents by referral from Dr. 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, blood pressure was mildly elevated Was given a 90-day card, told to come back at the end of the month for recheck  Was seen by primary care, lisinopril changed to Benicar He has not been checking his blood pressure at home, does not have a cuff  Lab work reviewed with him HAB1C 7, down from 8 Cholesterol well controlled Non-smoker  EKG personally reviewed by myself on todays visit Shows normal sinus rhythm with no significant ST-T wave changes, rate 94 bpm, previously noted inferior wall changes have resolved  EKG from primary care with normal sinus rhythm rate 86 bpm Nonspecific finding in inferior leads  PMH:   has a past medical history of Diabetic retinopathy ([place]), Diastasis recti (12/31/2016), Erectile dysfunction, Erectile dysfunction associated with type 2 diabetes mellitus ([place]) (02/20/2015), Fear of flying (12/31/2016), Hyperlipidemia LDL goal <100 (06/13/2015), Hypertension, Hypertension goal BP (blood pressure) < 130/80 (06/13/2015), Hypogonadism, male, Obesity, Class III, BMI 40-49.9 (morbid obesity) ([place]) (02/20/2015), Sleep apnea (09/29/2018), Type 2 diabetes mellitus ([place]) (10/01/2015), and Uncontrolled type 2 diabetes mellitus with peripheral circulatory disorder ([place]) (06/13/2015).  PSH:    Past Surgical History:  Procedure Laterality Date  .  COLONOSCOPY  2013  . COLONOSCOPY WITH PROPOFOL N/A 06/02/2018   Procedure: COLONOSCOPY WITH PROPOFOL;  Surgeon: Clarence Bellows, MD;  Location: [facility] ENDOSCOPY;  Service: Gastroenterology;  Laterality: N/A;    Current Outpatient Medications  Medication Sig Dispense Refill  . amLODipine (NORVASC) 10 MG tablet Take 1 tablet (10 mg total) by mouth daily. 30 tablet 0  . atorvastatin (LIPITOR) 40 MG tablet Take 1 tablet (40 mg total) by mouth daily at 6 PM. 90 tablet 3  . BESIVANCE 0.6 % SUSP INSTILL ONE DROP INTO BOTH EYES 4 TIMES A DAY FOR 2 DAYS AFTER EACH MONTHLY EYE INJECTION    . busPIRone (BUSPAR) 5 MG tablet Take 1 tablet (5 mg total) by mouth 2 (two) times daily as needed. 60 tablet 1  . Dapagliflozin-metFORMIN HCl ER (XIGDUO XR) 04-999 MG TB24 Take 2 tablets by mouth daily.    Marland Kitchen glipiZIDE (GLUCOTROL XL) 10 MG 24 hr tablet Take 1 tablet by mouth daily.    Marland Kitchen glucose blood (ONE TOUCH ULTRA TEST) test strip Use as instructed 300 each 3  . hydrochlorothiazide (HYDRODIURIL) 25 MG tablet Take 1 tablet (25 mg total) by mouth daily. 90 tablet 1  . Insulin Pen Needle (B-D UF III MINI PEN NEEDLES) 31G X 5 MM MISC FOR USE WITH INJECTABLE MEDICINE, ONCE A DAY 90 each 3  . olmesartan (BENICAR) 40 MG tablet Take 1 tablet (40 mg total) by mouth daily. In place of lisinopril 90 tablet 1  . ONETOUCH DELICA LANCETS FINE MISC 1 Device by Does not apply route 3 (three) times daily. 300 each 3  . OZEMPIC, 1 MG/DOSE, 2 MG/1.5ML SOPN INJECT 0.75 MLS (1 MG TOTAL)  SUBCUTANEOUSLY ONCE A WEEK FOR 30 DAYS    . pioglitazone (ACTOS) 30 MG tablet Take 30 mg by mouth daily.    . Vitamin D, Ergocalciferol, (DRISDOL) 1.25 MG (50000 UNIT) CAPS capsule Take 1 capsule (50,000 Units total) by mouth every 7 (seven) days. 12 capsule 1   No current facility-administered medications for this visit.     Allergies:   Patient has no known allergies.   Social History:  The patient  reports that he has never smoked. He has never used  smokeless tobacco. He reports that he does not drink alcohol or use drugs.   Family History:   family history includes Diabetes in his brother, brother, maternal grandfather, and mother; Hypertension in his maternal grandfather; Kidney disease in his mother; Stroke in his maternal grandfather.    Review of Systems: Review of Systems  Constitutional: Negative.   HENT: Negative.   Respiratory: Negative.   Cardiovascular: Negative.   Gastrointestinal: Negative.   Musculoskeletal: Negative.   Neurological: Negative.   Psychiatric/Behavioral: Negative.   All other systems reviewed and are negative.    PHYSICAL EXAM: VS:  BP (!) 142/90 (BP Location: Right Arm, Patient Position: Sitting, Cuff Size: Large)   Pulse 94   Ht 5' 9.5" (1.765 m)   Wt 288 lb 6 oz (130.8 kg)   SpO2 98%   BMI 41.98 kg/m  , BMI Body mass index is 41.98 kg/m. GEN: Well nourished, well developed, in no acute distress HEENT: normal Neck: no JVD, carotid bruits, or masses Cardiac: RRR; no murmurs, rubs, or gallops,no edema  Respiratory:  clear to auscultation bilaterally, normal work of breathing GI: soft, nontender, nondistended, + BS MS: no deformity or atrophy Skin: warm and dry, no rash Neuro:  Strength and sensation are intact Psych: euthymic mood, full affect    Recent Labs: 02/06/2020: ALT 14; BUN 16; Creat 1.06; Potassium 3.8; Sodium 138    Lipid Panel Lab Results  Component Value Date   CHOL 139 02/06/2020   HDL 39 (L) 02/06/2020   LDLCALC 81 02/06/2020   TRIG 95 02/06/2020      Wt Readings from Last 3 Encounters:  05/05/20 288 lb 6 oz (130.8 kg)  04/29/20 284 lb 4.8 oz (129 kg)  02/06/20 290 lb 11.2 oz (131.9 kg)       ASSESSMENT AND PLAN:  Problem List Items Addressed This Visit    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 ([place])    Other Visit Diagnoses    Nonspecific abnormal electrocardiogram (ECG) (EKG)    -  Primary   Relevant Orders    EKG 12-Lead   Benign essential HTN       Mixed hyperlipidemia         Diabetes Hemoglobin A1c much improved Recommend continue to monitor his diet, start exercise program Followed by endocrine  Abnormal EKG Normal EKG on today's visit Prior abnormal finding most likely from lead placement No further ischemic work-up needed at this time  Essential hypertension No medication changes made, recommend he monitor blood pressure at home and call us with numbers  DOT clearance Has repeat evaluation at the end of the month Recommended he bring home blood pressure measurements  Hyperlipidemia Numbers well controlled, no changes made  Preventive care Discussed CT coronary calcium scoring with him at some point He will call us back if he would like this ordered  Disposition:   F/U as needed   Total encounter time more than 60  minutes  Greater than 50% was spent in counseling and coordination of care with the patient    Signed, Esmond Plants, M.D., Ph.D. [place], [place]

## 2020-05-19 ENCOUNTER — Telehealth: Payer: Self-pay | Admitting: Cardiovascular Disease

## 2020-05-19 NOTE — Telephone Encounter (Signed)
Spoke with patient and reviewed that we did receive his letter but we are needing some blood pressure readings from him before we are able to clear him. Requested that he start monitoring it daily 2 hours after medications and to call me with those readings. I did make him aware that we really need these this week because provider will be out of the office the first week of June. He verbalized understanding with no further questions at this time.

## 2020-05-19 NOTE — Telephone Encounter (Signed)
Left detailed voicemail message to call back.

## 2020-05-19 NOTE — Telephone Encounter (Signed)
Patient dropped off DOT physical paperwork to be completed Placed in nurse box - please call patient when complete

## 2020-05-22 NOTE — Telephone Encounter (Signed)
Form completed by provider and will fax it to number provided by patient [phone]

## 2020-05-22 NOTE — Telephone Encounter (Signed)
Patient calling back with blood pressure readings for his DOT blood pressure clearance. He provided the readings below and had no further questions at this time.  128/77 135/84 134/85 133/90 124/85 138/79 129/76 144/90 127/72 138/81 131/83 126/83

## 2020-05-22 NOTE — Telephone Encounter (Signed)
Patient called during computer down time  States that he is ready with BP readings Please call to discuss

## 2020-05-23 NOTE — Telephone Encounter (Signed)
Patients wife calling back to state they have not received the fax and asked if it can be sent again

## 2020-05-23 NOTE — Telephone Encounter (Signed)
This form was faxed to number provided. Will call to confirm receipt.

## 2020-05-23 NOTE — Telephone Encounter (Signed)
Spoke with wife and confirmed that she did in fact get the fax. No further needs at this time.

## 2020-06-02 ENCOUNTER — Encounter (INDEPENDENT_AMBULATORY_CARE_PROVIDER_SITE_OTHER): Payer: BC Managed Care – PPO | Admitting: Ophthalmology

## 2020-06-02 ENCOUNTER — Other Ambulatory Visit: Payer: Self-pay

## 2020-06-02 DIAGNOSIS — H35033 Hypertensive retinopathy, bilateral: Secondary | ICD-10-CM | POA: Diagnosis not present

## 2020-06-02 DIAGNOSIS — E11311 Type 2 diabetes mellitus with unspecified diabetic retinopathy with macular edema: Secondary | ICD-10-CM | POA: Diagnosis not present

## 2020-06-02 DIAGNOSIS — H2513 Age-related nuclear cataract, bilateral: Secondary | ICD-10-CM

## 2020-06-02 DIAGNOSIS — I10 Essential (primary) hypertension: Secondary | ICD-10-CM | POA: Diagnosis not present

## 2020-06-02 DIAGNOSIS — H43813 Vitreous degeneration, bilateral: Secondary | ICD-10-CM

## 2020-06-02 DIAGNOSIS — E113313 Type 2 diabetes mellitus with moderate nonproliferative diabetic retinopathy with macular edema, bilateral: Secondary | ICD-10-CM

## 2020-06-19 ENCOUNTER — Other Ambulatory Visit: Payer: Self-pay | Admitting: Family Medicine

## 2020-06-19 DIAGNOSIS — I10 Essential (primary) hypertension: Secondary | ICD-10-CM

## 2020-06-20 ENCOUNTER — Other Ambulatory Visit: Payer: Self-pay

## 2020-06-22 MED ORDER — AMLODIPINE BESYLATE 10 MG PO TABS: 10.0000 mg | ORAL_TABLET | Freq: Every day | ORAL | 2 refills | Status: DC

## 2020-07-07 ENCOUNTER — Other Ambulatory Visit: Payer: Self-pay

## 2020-07-07 ENCOUNTER — Encounter (INDEPENDENT_AMBULATORY_CARE_PROVIDER_SITE_OTHER): Payer: BC Managed Care – PPO | Admitting: Ophthalmology

## 2020-07-07 DIAGNOSIS — H2513 Age-related nuclear cataract, bilateral: Secondary | ICD-10-CM

## 2020-07-07 DIAGNOSIS — I10 Essential (primary) hypertension: Secondary | ICD-10-CM | POA: Diagnosis not present

## 2020-07-07 DIAGNOSIS — E11311 Type 2 diabetes mellitus with unspecified diabetic retinopathy with macular edema: Secondary | ICD-10-CM | POA: Diagnosis not present

## 2020-07-07 DIAGNOSIS — E113313 Type 2 diabetes mellitus with moderate nonproliferative diabetic retinopathy with macular edema, bilateral: Secondary | ICD-10-CM

## 2020-07-07 DIAGNOSIS — H35033 Hypertensive retinopathy, bilateral: Secondary | ICD-10-CM

## 2020-07-07 DIAGNOSIS — H43813 Vitreous degeneration, bilateral: Secondary | ICD-10-CM

## 2020-08-04 LAB — LIPID PANEL
Cholesterol: 132 (ref 0–200)
HDL: 39 (ref 35–70)
LDL Cholesterol: 72
LDl/HDL Ratio: 3.4
Triglycerides: 106 (ref 40–160)

## 2020-08-04 LAB — HEMOGLOBIN A1C: Hemoglobin A1C: 7.2

## 2020-08-11 ENCOUNTER — Encounter (INDEPENDENT_AMBULATORY_CARE_PROVIDER_SITE_OTHER): Payer: BC Managed Care – PPO | Admitting: Ophthalmology

## 2020-08-11 ENCOUNTER — Encounter (INDEPENDENT_AMBULATORY_CARE_PROVIDER_SITE_OTHER): Payer: Self-pay

## 2020-08-11 ENCOUNTER — Other Ambulatory Visit: Payer: Self-pay

## 2020-08-18 ENCOUNTER — Encounter (INDEPENDENT_AMBULATORY_CARE_PROVIDER_SITE_OTHER): Payer: BC Managed Care – PPO | Admitting: Ophthalmology

## 2020-08-18 ENCOUNTER — Other Ambulatory Visit: Payer: Self-pay

## 2020-08-18 DIAGNOSIS — I10 Essential (primary) hypertension: Secondary | ICD-10-CM

## 2020-08-18 DIAGNOSIS — H35033 Hypertensive retinopathy, bilateral: Secondary | ICD-10-CM

## 2020-08-18 DIAGNOSIS — E11311 Type 2 diabetes mellitus with unspecified diabetic retinopathy with macular edema: Secondary | ICD-10-CM | POA: Diagnosis not present

## 2020-08-18 DIAGNOSIS — H2513 Age-related nuclear cataract, bilateral: Secondary | ICD-10-CM

## 2020-08-18 DIAGNOSIS — E113313 Type 2 diabetes mellitus with moderate nonproliferative diabetic retinopathy with macular edema, bilateral: Secondary | ICD-10-CM | POA: Diagnosis not present

## 2020-08-18 DIAGNOSIS — H43813 Vitreous degeneration, bilateral: Secondary | ICD-10-CM

## 2020-09-02 NOTE — Progress Notes (Signed)
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 No chest pain, palpitation or SOB  DMII: he sees Dr. Gabriel Carina, no recent episodes of hypoglycemia. He has retinopathy and sees Dr. West Pugh , last A1C was 7.2 %, urine micro last October slightly high , he is on ARB, he also has dyslipidemia and is on statin therapy. He denies polyphagia, polydipsia or polyuria.   Obesity: BMI above 40, he is a truck driver, reviewed importance of life style modification , he gained 11 lbs since last visit   Anxiety: he states usually takes prn only, gets very nervous before DOT   OSA: he wears his CPAP most nights of the week, he skips at most once per week, he keeps the mask on as long as he sleeps   Vitamin D deficiency: taking rx weekly medication and we will recheck next visit   Dyslipidemia: last LDL at 72, we will adjust from Atorvastatin to Rosuvastatin 40 and recheck labs next visit. He denies myalgias    Patient Active Problem List   Diagnosis Date Noted  . BPH with obstruction/lower urinary tract symptoms 04/29/2020  . 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 Vitamin D deficiency 01/29/2019  . Sleep apnea 09/29/2018  . Umbilical hernia without obstruction and without gangrene 12/31/2016  . Ventral hernia without obstruction or gangrene 12/31/2016  . Low testosterone in male 10/22/2016  . Hyperlipidemia LDL goal <70 06/13/2015  . Hypertension goal BP (blood pressure) < 130/80 06/13/2015  . ED (erectile dysfunction) of organic origin 02/20/2015  . Obesity, Class III, BMI 40-49.9 (morbid obesity)  ([place]) 02/20/2015    Past Surgical History:  Procedure Laterality Date  . COLONOSCOPY  2013  . COLONOSCOPY WITH PROPOFOL N/A 06/02/2018   Procedure: COLONOSCOPY WITH PROPOFOL;  Surgeon: Jonathon Bellows, MD;  [facility] END[facility];  Service: Gastroenterology;  Laterality: N/A;    Family History  Problem Relation Age of Onset  . Diabetes Mother   . Kidney disease Mother   . Diabetes Brother   . Diabetes Brother   . Diabetes Maternal Grandfather   . Hypertension Maternal Grandfather   . Stroke Maternal Grandfather     Social History   Tobacco Use  . Smoking status: Never Smoker  . Smokeless tobacco: Never Used  Substance Use Topics  . Alcohol use: No    Alcohol/week: 0.0 standard drinks     Current Outpatient Medications:  .  amLODipine (NORVASC) 10 MG tablet, Take 1 tablet (10 mg total) by mouth daily., Disp: 90 tablet, Rfl: 1 .  BESIVANCE 0.6 % SUSP, INSTILL ONE DROP INTO BOTH EYES 4 TIMES A DAY FOR 2 DAYS AFTER EACH MONTHLY EYE INJECTION, Disp: , Rfl:  .  busPIRone (BUSPAR) 5 MG tablet, Take 1 tablet (5 mg total) by mouth 2 (two) times daily as needed., Disp: 60 tablet, Rfl: 1 .  Dapagliflozin-metFORMIN HCl ER (XIGDUO XR) 04-999 MG TB24, Take 2 tablets by mouth daily., Disp: , Rfl:  .  glipiZIDE (GLUCOTROL XL) 10 MG 24 hr  tablet, Take 1 tablet by mouth daily., Disp: , Rfl:  .  glucose blood (ONE TOUCH ULTRA TEST) test strip, Use as instructed, Disp: 300 each, Rfl: 3 .  Insulin Pen Needle (B-D UF III MINI PEN NEEDLES) 31G X 5 MM MISC, FOR USE WITH INJECTABLE MEDICINE, ONCE A DAY, Disp: 90 each, Rfl: 3 .  ONETOUCH DELICA LANCETS FINE MISC, 1 Device by Does not apply route 3 (three) times daily., Disp: 300 each, Rfl: 3 .  OZEMPIC, 1 MG/DOSE, 2 MG/1.5ML SOPN, INJECT 0.75 MLS (1 MG TOTAL) SUBCUTANEOUSLY ONCE A WEEK FOR 30 DAYS, Disp: , Rfl:  .  tadalafil (CIALIS) 20 MG tablet, Take by mouth., Disp: , Rfl:  .  Vitamin D, Ergocalciferol, (DRISDOL) 1.25 MG (50000 UNIT) CAPS capsule,  Take 1 capsule (50,000 Units total) by mouth every 7 (seven) days., Disp: 12 capsule, Rfl: 1 .  metoprolol succinate (TOPROL-XL) 25 MG 24 hr tablet, Take 1 tablet (25 mg total) by mouth daily., Disp: 90 tablet, Rfl: 1 .  olmesartan-hydrochlorothiazide (BENICAR HCT) 40-25 MG tablet, Take 1 tablet by mouth daily., Disp: 90 tablet, Rfl: 1 .  rosuvastatin (CRESTOR) 40 MG tablet, Take 1 tablet (40 mg total) by mouth daily. In place of Atorvastatin, Disp: 90 tablet, Rfl: 1  No Known Allergies  I personally reviewed active problem list, medication list, allergies, family history, social history, health maintenance with the patient/caregiver today.   ROS  Constitutional: Negative for fever or weight change.  Respiratory: Negative for cough and shortness of breath.   Cardiovascular: Negative for chest pain or palpitations.  Gastrointestinal: Negative for abdominal pain, no bowel changes.  Musculoskeletal: Negative for gait problem or joint swelling.  Skin: Negative for rash.  Neurological: Negative for dizziness or headache.  No other specific complaints in a complete review of systems (except as listed in HPI above).  Objective  Vitals:   09/03/20 1014  BP: 140/90  Pulse: 89  Resp: 18  Temp: 97.9 F (36.6 C)  TempSrc: Oral  SpO2: 97%  Weight: 295 lb 1.6 oz (133.9 kg)  Height: 5\' 10"  (1.778 m)    Body mass index is 42.34 kg/m.  Physical Exam  Constitutional: Patient appears well-developed and well-nourished. Obese  No distress.  HEENT: head atraumatic, normocephalic, pupils equal and reactive to light, neck supple Cardiovascular: Normal rate, regular rhythm and normal heart sounds.  No murmur heard. No BLE edema. Pulmonary/Chest: Effort normal and breath sounds normal. No respiratory distress. Abdominal: Soft.  There is no tenderness. Psychiatric: Patient has a normal mood and affect. behavior is normal. Judgment and thought content normal.   PHQ2/9: Depression screen [facility] 2/9  09/03/2020 04/29/2020 04/29/2020 02/06/2020 08/06/2019  Decreased Interest 0 0 0 0 0  Down, Depressed, Hopeless 0 0 0 0 0  PHQ - 2 Score 0 0 0 0 0  Altered sleeping - 1 0 0 0  Tired, decreased energy - 1 0 0 0  Change in appetite - 0 0 0 0  Feeling bad or failure about yourself  - 0 0 0 0  Trouble concentrating - 0 0 0 0  Moving slowly or fidgety/restless - 0 0 0 0  Suicidal thoughts - 0 0 0 0  PHQ-9 Score - 2 0 0 0  Difficult doing work/chores - Not difficult at all - Not difficult at all -  Some recent data might be hidden    phq 9 is negative   Fall Risk: Fall Risk  09/03/2020 04/29/2020 02/06/2020 08/06/2019 01/29/2019  Falls in the past year? 0 0 0 0 0  Number falls in past yr: 0 0 0 0 -  Injury with Fall? 0 0 0 0 -     Functional Status Survey: Is the patient deaf or have difficulty hearing?: No Does the patient have difficulty seeing, even when wearing glasses/contacts?: No Does the patient have difficulty concentrating, remembering, or making decisions?: No Does the patient have difficulty walking or climbing stairs?: No Does the patient have difficulty dressing or bathing?: No Does the patient have difficulty doing errands alone such as visiting a doctor's office or shopping?: No    Assessment & Plan  1. Need for immunization against influenza  - Flu Vaccine QUAD 36+ mos IM  2. Hypertension goal BP (blood pressure) < 130/80  - amLODipine (NORVASC) 10 MG tablet; Take 1 tablet (10 mg total) by mouth daily.  Dispense: 90 tablet; Refill: 1 - olmesartan-hydrochlorothiazide (BENICAR HCT) 40-25 MG tablet; Take 1 tablet by mouth daily.  Dispense: 90 tablet; Refill: 1 - metoprolol succinate (TOPROL-XL) 25 MG 24 hr tablet; Take 1 tablet (25 mg total) by mouth daily.  Dispense: 90 tablet; Refill: 1  3. Vitamin D deficiency  - Vitamin D, Ergocalciferol, (DRISDOL) 1.25 MG (50000 UNIT) CAPS capsule; Take 1 capsule (50,000 Units total) by mouth every 7 (seven) days.  Dispense: 12 capsule;  Refill: 1    4. Hyperlipidemia LDL goal <70  - rosuvastatin (CRESTOR) 40 MG tablet; Take 1 tablet (40 mg total) by mouth daily. In place of Atorvastatin  Dispense: 90 tablet; Refill: 1  5. Obesity, Class III, BMI 40-49.9 (morbid obesity) ([place])   6. Obstructive sleep apnea syndrome   7. Situational anxiety   8. 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

## 2020-09-03 ENCOUNTER — Ambulatory Visit: Payer: BC Managed Care – PPO | Admitting: Family Medicine

## 2020-09-03 ENCOUNTER — Other Ambulatory Visit: Payer: Self-pay

## 2020-09-03 ENCOUNTER — Encounter: Payer: Self-pay | Admitting: Family Medicine

## 2020-09-03 VITALS — BP 140/82 | HR 89 | Temp 97.9°F | Resp 18 | Ht 70.0 in | Wt 295.1 lb

## 2020-09-03 DIAGNOSIS — E785 Hyperlipidemia, unspecified: Secondary | ICD-10-CM | POA: Diagnosis not present

## 2020-09-03 DIAGNOSIS — Z23 Encounter for immunization: Secondary | ICD-10-CM | POA: Diagnosis not present

## 2020-09-03 DIAGNOSIS — G4733 Obstructive sleep apnea (adult) (pediatric): Secondary | ICD-10-CM

## 2020-09-03 DIAGNOSIS — E559 Vitamin D deficiency, unspecified: Secondary | ICD-10-CM

## 2020-09-03 DIAGNOSIS — F418 Other specified anxiety disorders: Secondary | ICD-10-CM

## 2020-09-03 DIAGNOSIS — I10 Essential (primary) hypertension: Secondary | ICD-10-CM

## 2020-09-03 DIAGNOSIS — E11311 Type 2 diabetes mellitus with unspecified diabetic retinopathy with macular edema: Secondary | ICD-10-CM

## 2020-09-03 MED ORDER — ROSUVASTATIN CALCIUM 40 MG PO TABS: 40.0000 mg | ORAL_TABLET | Freq: Every day | ORAL | 1 refills | Status: DC

## 2020-09-03 MED ORDER — VITAMIN D (ERGOCALCIFEROL) 1.25 MG (50000 UNIT) PO CAPS: 50000.0000 [IU] | ORAL_CAPSULE | ORAL | 1 refills | Status: DC

## 2020-09-03 MED ORDER — AMLODIPINE BESYLATE 10 MG PO TABS: 10.0000 mg | ORAL_TABLET | Freq: Every day | ORAL | 1 refills | Status: DC

## 2020-09-03 MED ORDER — METOPROLOL SUCCINATE ER 25 MG PO TB24: 25.0000 mg | ORAL_TABLET | Freq: Every day | ORAL | 1 refills | Status: DC

## 2020-09-03 MED ORDER — OLMESARTAN MEDOXOMIL-HCTZ 40-25 MG PO TABS: 1.0000 | ORAL_TABLET | Freq: Every day | ORAL | 1 refills | Status: DC

## 2020-09-03 NOTE — Patient Instructions (Addendum)
Please take two pills of atorvastatin 40 until out of the medication, when you go back to the pharmacy your new rx will be called Rosuvastatin 40 mg   Replacing Olmasartan 40 mg and Hydrochlorothiazide 25 mg to combo medication ( two pills in one - same names)   Adding Metoprolol 25 mg daily    Managing Your Hypertension Hypertension is commonly called high blood pressure. This is when the force of your blood pressing against the walls of your arteries is too strong. Arteries are blood vessels that carry blood from your heart throughout your body. Hypertension forces the heart to work harder to pump blood, and may cause the arteries to become narrow or stiff. Having untreated or uncontrolled hypertension can cause heart attack, stroke, kidney disease, and other problems. What are blood pressure readings? A blood pressure reading consists of a higher number over a lower number. Ideally, your blood pressure should be below 120/80. The first ("top") number is called the systolic pressure. It is a measure of the pressure in your arteries as your heart beats. The second ("bottom") number is called the diastolic pressure. It is a measure of the pressure in your arteries as the heart relaxes. What does my blood pressure reading mean? Blood pressure is classified into four stages. Based on your blood pressure reading, your health care provider may use the following stages to determine what type of treatment you need, if any. Systolic pressure and diastolic pressure are measured in a unit called mm Hg. Normal  Systolic pressure: below 143.  Diastolic pressure: below 80. Elevated  Systolic pressure: 888-757.  Diastolic pressure: below 80. Hypertension stage 1  Systolic pressure: 972-820.  Diastolic pressure: 60-15. Hypertension stage 2  Systolic pressure: 615 or above.  Diastolic pressure: 90 or above. What health risks are associated with hypertension? Managing your hypertension is an  important responsibility. Uncontrolled hypertension can lead to:  A heart attack.  A stroke.  A weakened blood vessel (aneurysm).  Heart failure.  Kidney damage.  Eye damage.  Metabolic syndrome.  Memory and concentration problems. What changes can I make to manage my hypertension? Hypertension can be managed by making lifestyle changes and possibly by taking medicines. Your health care provider will help you make a plan to bring your blood pressure within a normal range. Eating and drinking   Eat a diet that is high in fiber and potassium, and low in salt (sodium), added sugar, and fat. An example eating plan is called the DASH (Dietary Approaches to Stop Hypertension) diet. To eat this way: ? Eat plenty of fresh fruits and vegetables. Try to fill half of your plate at each meal with fruits and vegetables. ? Eat whole grains, such as whole wheat pasta, brown rice, or whole grain bread. Fill about one quarter of your plate with whole grains. ? Eat low-fat diary products. ? Avoid fatty cuts of meat, processed or cured meats, and poultry with skin. Fill about one quarter of your plate with lean proteins such as fish, chicken without skin, beans, eggs, and tofu. ? Avoid premade and processed foods. These tend to be higher in sodium, added sugar, and fat.  Reduce your daily sodium intake. Most people with hypertension should eat less than 1,500 mg of sodium a day.  Limit alcohol intake to no more than 1 drink a day for nonpregnant women and 2 drinks a day for men. One drink equals 12 oz of beer, 5 oz of wine, or 1 oz of hard  liquor. Lifestyle  Work with your health care provider to maintain a healthy body weight, or to lose weight. Ask what an ideal weight is for you.  Get at least 30 minutes of exercise that causes your heart to beat faster (aerobic exercise) most days of the week. Activities may include walking, swimming, or biking.  Include exercise to strengthen your muscles  (resistance exercise), such as weight lifting, as part of your weekly exercise routine. Try to do these types of exercises for 30 minutes at least 3 days a week.  Do not use any products that contain nicotine or tobacco, such as cigarettes and e-cigarettes. If you need help quitting, ask your health care provider.  Control any long-term (chronic) conditions you have, such as high cholesterol or diabetes. Monitoring  Monitor your blood pressure at home as told by your health care provider. Your personal target blood pressure may vary depending on your medical conditions, your age, and other factors.  Have your blood pressure checked regularly, as often as told by your health care provider. Working with your health care provider  Review all the medicines you take with your health care provider because there may be side effects or interactions.  Talk with your health care provider about your diet, exercise habits, and other lifestyle factors that may be contributing to hypertension.  Visit your health care provider regularly. Your health care provider can help you create and adjust your plan for managing hypertension. Will I need medicine to control my blood pressure? Your health care provider may prescribe medicine if lifestyle changes are not enough to get your blood pressure under control, and if:  Your systolic blood pressure is 130 or higher.  Your diastolic blood pressure is 80 or higher. Take medicines only as told by your health care provider. Follow the directions carefully. Blood pressure medicines must be taken as prescribed. The medicine does not work as well when you skip doses. Skipping doses also puts you at risk for problems. Contact a health care provider if:  You think you are having a reaction to medicines you have taken.  You have repeated (recurrent) headaches.  You feel dizzy.  You have swelling in your ankles.  You have trouble with your vision. Get help right  away if:  You develop a severe headache or confusion.  You have unusual weakness or numbness, or you feel faint.  You have severe pain in your chest or abdomen.  You vomit repeatedly.  You have trouble breathing. Summary  Hypertension is when the force of blood pumping through your arteries is too strong. If this condition is not controlled, it may put you at risk for serious complications.  Your personal target blood pressure may vary depending on your medical conditions, your age, and other factors. For most people, a normal blood pressure is less than 120/80.  Hypertension is managed by lifestyle changes, medicines, or both. Lifestyle changes include weight loss, eating a healthy, low-sodium diet, exercising more, and limiting alcohol. This information is not intended to replace advice given to you by your health care provider. Make sure you discuss any questions you have with your health care provider. Document Revised: 04/06/2019 Document Reviewed: 11/10/2016 Elsevier Patient Education  [place].

## 2020-09-22 ENCOUNTER — Other Ambulatory Visit: Payer: Self-pay

## 2020-09-22 ENCOUNTER — Encounter (INDEPENDENT_AMBULATORY_CARE_PROVIDER_SITE_OTHER): Payer: BC Managed Care – PPO | Admitting: Ophthalmology

## 2020-09-22 DIAGNOSIS — I10 Essential (primary) hypertension: Secondary | ICD-10-CM | POA: Diagnosis not present

## 2020-09-22 DIAGNOSIS — E113313 Type 2 diabetes mellitus with moderate nonproliferative diabetic retinopathy with macular edema, bilateral: Secondary | ICD-10-CM

## 2020-09-22 DIAGNOSIS — H35033 Hypertensive retinopathy, bilateral: Secondary | ICD-10-CM

## 2020-09-22 DIAGNOSIS — E11311 Type 2 diabetes mellitus with unspecified diabetic retinopathy with macular edema: Secondary | ICD-10-CM | POA: Diagnosis not present

## 2020-09-22 DIAGNOSIS — H43813 Vitreous degeneration, bilateral: Secondary | ICD-10-CM

## 2020-09-30 ENCOUNTER — Other Ambulatory Visit: Payer: Self-pay | Admitting: Family Medicine

## 2020-09-30 DIAGNOSIS — I10 Essential (primary) hypertension: Secondary | ICD-10-CM

## 2020-10-23 ENCOUNTER — Other Ambulatory Visit: Payer: Self-pay | Admitting: Family Medicine

## 2020-10-23 DIAGNOSIS — I10 Essential (primary) hypertension: Secondary | ICD-10-CM

## 2020-10-23 NOTE — Telephone Encounter (Signed)
   Notes to clinic: REQUEST FOR 90 DAYS PRESCRIPTION. DX Code Needed   Requested Prescriptions  Pending Prescriptions Disp Refills   busPIRone (BUSPAR) 5 MG tablet [Pharmacy Med Name: BUSPIRONE HCL 5 MG TABLET] 180 tablet 1    Sig: TAKE 1 TABLET BY MOUTH TWICE A DAY AS NEEDED      Psychiatry: Anxiolytics/Hypnotics - Non-controlled Passed - 10/23/2020  1:34 PM      Passed - Valid encounter within last 6 months    Recent Outpatient Visits           1 month ago Need for immunization against influenza   [facility] Steele Sizer, MD   5 months ago Well adult exam   [facility] Steele Sizer, MD   8 months ago Hypertension goal BP (blood pressure) < 130/80   [facility] [place], Kristeen Miss, PA-C   1 year ago Uncontrolled type 2 diabetes mellitus with peripheral circulatory disorder [facility])   [place], NP   1 year ago Nail abnormalities   [place], Satira Anis, MD       Future Appointments             In 1 month Ancil Boozer, Drue Stager, MD [facility], [place]   In 6 months Steele Sizer, MD [facility], [facility]

## 2020-10-24 ENCOUNTER — Other Ambulatory Visit: Payer: Self-pay

## 2020-10-27 ENCOUNTER — Other Ambulatory Visit: Payer: Self-pay

## 2020-10-27 ENCOUNTER — Encounter (INDEPENDENT_AMBULATORY_CARE_PROVIDER_SITE_OTHER): Payer: BC Managed Care – PPO | Admitting: Ophthalmology

## 2020-10-27 DIAGNOSIS — H35033 Hypertensive retinopathy, bilateral: Secondary | ICD-10-CM

## 2020-10-27 DIAGNOSIS — E113313 Type 2 diabetes mellitus with moderate nonproliferative diabetic retinopathy with macular edema, bilateral: Secondary | ICD-10-CM

## 2020-10-27 DIAGNOSIS — H43813 Vitreous degeneration, bilateral: Secondary | ICD-10-CM

## 2020-10-27 DIAGNOSIS — E11311 Type 2 diabetes mellitus with unspecified diabetic retinopathy with macular edema: Secondary | ICD-10-CM

## 2020-10-27 DIAGNOSIS — I10 Essential (primary) hypertension: Secondary | ICD-10-CM | POA: Diagnosis not present

## 2020-11-03 ENCOUNTER — Other Ambulatory Visit: Payer: Self-pay | Admitting: Family Medicine

## 2020-11-03 DIAGNOSIS — I10 Essential (primary) hypertension: Secondary | ICD-10-CM

## 2020-12-01 ENCOUNTER — Encounter (INDEPENDENT_AMBULATORY_CARE_PROVIDER_SITE_OTHER): Payer: BC Managed Care – PPO | Admitting: Ophthalmology

## 2020-12-01 ENCOUNTER — Other Ambulatory Visit: Payer: Self-pay

## 2020-12-01 DIAGNOSIS — H43813 Vitreous degeneration, bilateral: Secondary | ICD-10-CM

## 2020-12-01 DIAGNOSIS — E11311 Type 2 diabetes mellitus with unspecified diabetic retinopathy with macular edema: Secondary | ICD-10-CM | POA: Diagnosis not present

## 2020-12-01 DIAGNOSIS — H35033 Hypertensive retinopathy, bilateral: Secondary | ICD-10-CM

## 2020-12-01 DIAGNOSIS — I10 Essential (primary) hypertension: Secondary | ICD-10-CM | POA: Diagnosis not present

## 2020-12-01 DIAGNOSIS — E113313 Type 2 diabetes mellitus with moderate nonproliferative diabetic retinopathy with macular edema, bilateral: Secondary | ICD-10-CM

## 2020-12-08 ENCOUNTER — Other Ambulatory Visit: Payer: Self-pay

## 2020-12-08 ENCOUNTER — Encounter: Payer: Self-pay | Admitting: Family Medicine

## 2020-12-08 ENCOUNTER — Ambulatory Visit: Payer: BC Managed Care – PPO | Admitting: Family Medicine

## 2020-12-08 VITALS — BP 142/88 | HR 96 | Temp 97.9°F | Resp 16 | Ht 70.0 in | Wt 300.8 lb

## 2020-12-08 DIAGNOSIS — G4726 Circadian rhythm sleep disorder, shift work type: Secondary | ICD-10-CM

## 2020-12-08 DIAGNOSIS — E559 Vitamin D deficiency, unspecified: Secondary | ICD-10-CM

## 2020-12-08 DIAGNOSIS — E785 Hyperlipidemia, unspecified: Secondary | ICD-10-CM | POA: Diagnosis not present

## 2020-12-08 DIAGNOSIS — I10 Essential (primary) hypertension: Secondary | ICD-10-CM

## 2020-12-08 DIAGNOSIS — N400 Enlarged prostate without lower urinary tract symptoms: Secondary | ICD-10-CM | POA: Diagnosis not present

## 2020-12-08 DIAGNOSIS — E11311 Type 2 diabetes mellitus with unspecified diabetic retinopathy with macular edema: Secondary | ICD-10-CM

## 2020-12-08 DIAGNOSIS — G4733 Obstructive sleep apnea (adult) (pediatric): Secondary | ICD-10-CM

## 2020-12-08 MED ORDER — TADALAFIL 20 MG PO TABS: 20.0000 mg | ORAL_TABLET | ORAL | 0 refills | Status: DC | PRN

## 2020-12-08 MED ORDER — NEBIVOLOL HCL 10 MG PO TABS
10.0000 mg | ORAL_TABLET | Freq: Every day | ORAL | 0 refills | Status: DC
Start: 2020-12-08 — End: 2021-02-05

## 2020-12-08 MED ORDER — MODAFINIL 100 MG PO TABS: 100.0000 mg | ORAL_TABLET | Freq: Every day | ORAL | 0 refills | Status: DC

## 2020-12-08 NOTE — Progress Notes (Signed)
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 Discussed physical activity, cut down on caffeine, salt and eat more fruit and vegetables.  DMII: he sees Dr. Gabriel Carina, no recent episodes of hypoglycemia. He has retinopathy and sees Dr. West Pugh , last A1C was 7.2 % done 07/2020 , last urine micro done at Dr. Joycie Peek office was back to normal , he is on ARB, he also has dyslipidemia and is on statin therapy. He denies polyphagia, polydipsia or polyuria. LDL should be a little better - 72, but good cholesterol improved at 39  He has been taking Nos energy drinks - two cans daily - each can has 54 grams of carbs.  Obesity: BMI above 40, he is a truck driver, reviewed importance of life style modification , he gained another 5 lbs since last visit, he is eating balanced but drinks energy drinks twice daily to stay up , it has 54 grams of sugar per can. Explained he must stopped drinking it, we will try adding Provigil to help with SWSD and also OSA with sedation   Anxiety: he states usually takes Buspar prn  only, gets very nervous before DOT , unchanged Also discussed cutting down on caffeine  OSA: he wears his CPAP most nights of the week, he skips at most once per week, he keeps the mask on as long as he sleeps He works from midnight until noon - 5 days a week, he feels tired. We will add Provigil   Vitamin D deficiency: taking rx weekly medication , we will recheck labs next visit   Dyslipidemia: last LDL at 72, we adjusted from Atorvastatin to Rosuvastatin 40 , he would like to hold off on rechecking it today    Patient Active Problem List   Diagnosis  Date Noted  . BPH with obstruction/lower urinary tract symptoms 04/29/2020  . 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 Vitamin D deficiency 01/29/2019  . Sleep apnea 09/29/2018  . Umbilical hernia without obstruction and without gangrene 12/31/2016  . Ventral hernia without obstruction or gangrene 12/31/2016  . Low testosterone in male 10/22/2016  . Hyperlipidemia LDL goal <70 06/13/2015  . Hypertension goal BP (blood pressure) < 130/80 06/13/2015  . ED (erectile dysfunction) of organic origin 02/20/2015  . Obesity, Class III, BMI 40-49.9 (morbid obesity) ([place]) 02/20/2015    Past Surgical History:  Procedure Laterality Date  . COLONOSCOPY  2013  . COLONOSCOPY WITH PROPOFOL N/A 06/02/2018   Procedure: COLONOSCOPY WITH PROPOFOL;  Surgeon: Jonathon Bellows, MD;  Location[facility] END[facility]COPY;  Service: Gastroenterology;  Laterality: N/A;    Family History  Problem Relation Age of Onset  . Diabetes Mother   . Kidney disease Mother   . Diabetes Brother   . Diabetes Brother   . Diabetes Maternal Grandfather   . Hypertension Maternal Grandfather   . Stroke Maternal Grandfather     Social History   Tobacco Use  . Smoking status: Never Smoker  . Smokeless tobacco: Never Used  Substance Use Topics  .  Alcohol use: No    Alcohol/week: 0.0 standard drinks     Current Outpatient Medications:  .  amLODipine (NORVASC) 10 MG tablet, Take 1 tablet (10 mg total) by mouth daily., Disp: 90 tablet, Rfl: 1 .  BESIVANCE 0.6 % SUSP, INSTILL ONE DROP INTO BOTH EYES 4 TIMES A DAY FOR 2 DAYS AFTER EACH MONTHLY EYE INJECTION, Disp: , Rfl:  .  brimonidine (ALPHAGAN) 0.15 % ophthalmic solution, SMARTSIG:In Eye(s), Disp: , Rfl:  .  busPIRone (BUSPAR) 5 MG tablet, TAKE 1 TABLET BY MOUTH 2 TIMES DAILY AS NEEDED., Disp: 60 tablet, Rfl: 2 .  Dapagliflozin-metFORMIN HCl ER (XIGDUO XR) 04-999 MG TB24, Take 2 tablets by mouth daily., Disp: , Rfl:  .   glipiZIDE (GLUCOTROL XL) 10 MG 24 hr tablet, Take 1 tablet by mouth daily., Disp: , Rfl:  .  glucose blood (ONE TOUCH ULTRA TEST) test strip, Use as instructed, Disp: 300 each, Rfl: 3 .  Insulin Pen Needle (B-D UF III MINI PEN NEEDLES) 31G X 5 MM MISC, FOR USE WITH INJECTABLE MEDICINE, ONCE A DAY, Disp: 90 each, Rfl: 3 .  metoprolol succinate (TOPROL-XL) 25 MG 24 hr tablet, Take 1 tablet (25 mg total) by mouth daily., Disp: 90 tablet, Rfl: 1 .  olmesartan-hydrochlorothiazide (BENICAR HCT) 40-25 MG tablet, Take 1 tablet by mouth daily., Disp: 90 tablet, Rfl: 1 .  ONETOUCH DELICA LANCETS FINE MISC, 1 Device by Does not apply route 3 (three) times daily., Disp: 300 each, Rfl: 3 .  OZEMPIC, 1 MG/DOSE, 4 MG/3ML SOPN, Inject 1 mg into the skin daily., Disp: , Rfl:  .  pioglitazone (ACTOS) 30 MG tablet, Take 30 mg by mouth daily., Disp: , Rfl:  .  rosuvastatin (CRESTOR) 40 MG tablet, Take 1 tablet (40 mg total) by mouth daily. In place of Atorvastatin, Disp: 90 tablet, Rfl: 1 .  tadalafil (CIALIS) 20 MG tablet, Take by mouth., Disp: , Rfl:  .  Vitamin D, Ergocalciferol, (DRISDOL) 1.25 MG (50000 UNIT) CAPS capsule, Take 1 capsule (50,000 Units total) by mouth every 7 (seven) days., Disp: 12 capsule, Rfl: 1 .  modafinil (PROVIGIL) 100 MG tablet, Take 1 tablet (100 mg total) by mouth daily., Disp: 30 tablet, Rfl: 0  No Known Allergies  I personally reviewed active problem list, medication list, allergies, family history, social history, health maintenance, notes from last encounter with the patient/caregiver today.   ROS  Constitutional: Negative for fever or weight change.  Respiratory: Negative for cough and shortness of breath.   Cardiovascular: Negative for chest pain or palpitations.  Gastrointestinal: Negative for abdominal pain, no bowel changes.  Musculoskeletal: Negative for gait problem or joint swelling.  Skin: Negative for rash.  Neurological: Negative for dizziness or headache.  No  other specific complaints in a complete review of systems (except as listed in HPI above).  Objective  Vitals:   12/08/20 1320  BP: (!) 142/88  Pulse: 96  Resp: 16  Temp: 97.9 F (36.6 C)  TempSrc: Oral  SpO2: 99%  Weight: (!) 300 lb 12.8 oz (136.4 kg)  Height: 5\' 10"  (1.778 m)    Body mass index is 43.16 kg/m.  Physical Exam  Constitutional: Patient appears well-developed and well-nourished. Obese  No distress.  HEENT: head atraumatic, normocephalic, pupils equal and reactive to light, neck supple Cardiovascular: Normal rate, regular rhythm and normal heart sounds.  No murmur heard. No BLE edema. Pulmonary/Chest: Effort normal and breath sounds normal. No respiratory distress. Abdominal: Soft.  There is no  tenderness. Psychiatric: Patient has a normal mood and affect. behavior is normal. Judgment and thought content normal.  PHQ2/9: Depression screen [facility] 2/9 12/08/2020 09/03/2020 04/29/2020 04/29/2020 02/06/2020  Decreased Interest 0 0 0 0 0  Down, Depressed, Hopeless 0 0 0 0 0  PHQ - 2 Score 0 0 0 0 0  Altered sleeping - - 1 0 0  Tired, decreased energy - - 1 0 0  Change in appetite - - 0 0 0  Feeling bad or failure about yourself  - - 0 0 0  Trouble concentrating - - 0 0 0  Moving slowly or fidgety/restless - - 0 0 0  Suicidal thoughts - - 0 0 0  PHQ-9 Score - - 2 0 0  Difficult doing work/chores - - Not difficult at all - Not difficult at all  Some recent data might be hidden    phq 9 is negative   Fall Risk: Fall Risk  12/08/2020 09/03/2020 04/29/2020 02/06/2020 08/06/2019  Falls in the past year? 0 0 0 0 0  Number falls in past yr: 0 0 0 0 0  Injury with Fall? 0 0 0 0 0     Functional Status Survey: Is the patient deaf or have difficulty hearing?: No Does the patient have difficulty seeing, even when wearing glasses/contacts?: No Does the patient have difficulty concentrating, remembering, or making decisions?: No Does the patient have difficulty walking or  climbing stairs?: No Does the patient have difficulty dressing or bathing?: No Does the patient have difficulty doing errands alone such as visiting a doctor's office or shopping?: No    Assessment & Plan  1. Hyperlipidemia LDL goal <70   2. BPH without urinary obstruction   3. Hypertension goal BP (blood pressure) < 130/80  - nebivolol (BYSTOLIC) 10 MG tablet; Take 1 tablet (10 mg total) by mouth daily. In place of metoprolol  Dispense: 90 tablet; Refill: 0  4. 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 Obesity, Class III, BMI 40-49.9 (morbid obesity) ([place])  Discussed with the patient the risk posed by an increased BMI. Discussed importance of portion control, calorie counting and at least 150 minutes of physical activity weekly. Avoid sweet beverages and drink more water. Eat at least 6 servings of fruit and vegetables daily   6. Vitamin D deficiency  Continue supplementation   7. OSA (obstructive sleep apnea)  - modafinil (PROVIGIL) 100 MG tablet; Take 1 tablet (100 mg total) by mouth daily.  Dispense: 30 tablet; Refill: 0  8. Shift work sleep disorder  - modafinil (PROVIGIL) 100 MG tablet; Take 1 tablet (100 mg total) by mouth daily.  Dispense: 30 tablet; Refill: 0

## 2020-12-08 NOTE — Patient Instructions (Signed)
Stop Metoprolol and HCTZ Start Bystolic for bp Take Amlodipine before work  Take Olmasartan hctz 40/25 when you get home Modafinil will keep you awake, take it before going to work Stop energy drinks and drink more water or coffee with splend Look at keto friendly options for snacks and meals.

## 2020-12-09 ENCOUNTER — Ambulatory Visit: Payer: BC Managed Care – PPO | Attending: Internal Medicine

## 2020-12-09 ENCOUNTER — Other Ambulatory Visit: Payer: Self-pay | Admitting: Internal Medicine

## 2020-12-09 DIAGNOSIS — Z23 Encounter for immunization: Secondary | ICD-10-CM

## 2020-12-09 NOTE — Progress Notes (Signed)
   Covid-19 Vaccination Clinic  Name:  Clarence Black    MRN: [id number] DOB: [birth year]  12/09/2020  [name] was observed post Covid-19 immunization for 15 minutes without incident. He was provided with Vaccine Information Sheet and instruction to access the V-Safe system.   [name] was instructed to call 911 with any severe reactions post vaccine: Marland Kitchen Difficulty breathing  . Swelling of face and throat  . A fast heartbeat  . A bad rash all over body  . Dizziness and weakness   Immunizations Administered    Name Date Dose VIS Date Route   Pfizer COVID-19 Vaccine 12/09/2020  2:35 PM 0.3 mL 10/15/2020 Intramuscular   Manufacturer: [place]   Lot: [id number]   [address]: [id number]

## 2021-01-05 ENCOUNTER — Other Ambulatory Visit: Payer: Self-pay

## 2021-01-05 ENCOUNTER — Encounter (INDEPENDENT_AMBULATORY_CARE_PROVIDER_SITE_OTHER): Payer: BC Managed Care – PPO | Admitting: Ophthalmology

## 2021-01-05 DIAGNOSIS — E113313 Type 2 diabetes mellitus with moderate nonproliferative diabetic retinopathy with macular edema, bilateral: Secondary | ICD-10-CM

## 2021-01-05 DIAGNOSIS — I10 Essential (primary) hypertension: Secondary | ICD-10-CM

## 2021-01-05 DIAGNOSIS — H35033 Hypertensive retinopathy, bilateral: Secondary | ICD-10-CM

## 2021-01-05 DIAGNOSIS — H43813 Vitreous degeneration, bilateral: Secondary | ICD-10-CM

## 2021-01-13 ENCOUNTER — Other Ambulatory Visit: Payer: Self-pay | Admitting: Family Medicine

## 2021-01-13 DIAGNOSIS — G4726 Circadian rhythm sleep disorder, shift work type: Secondary | ICD-10-CM

## 2021-01-13 DIAGNOSIS — G4733 Obstructive sleep apnea (adult) (pediatric): Secondary | ICD-10-CM

## 2021-01-15 ENCOUNTER — Other Ambulatory Visit: Payer: Self-pay

## 2021-01-15 ENCOUNTER — Ambulatory Visit: Payer: BC Managed Care – PPO | Admitting: Emergency Medicine

## 2021-01-15 VITALS — BP 138/86 | HR 77 | Resp 18

## 2021-01-15 DIAGNOSIS — Z0130 Encounter for examination of blood pressure without abnormal findings: Secondary | ICD-10-CM

## 2021-01-15 NOTE — Telephone Encounter (Signed)
Pt is out of town and will try to get in to do BP check when he gets in

## 2021-01-19 LAB — HEMOGLOBIN A1C: Hemoglobin A1C: 7

## 2021-02-05 ENCOUNTER — Other Ambulatory Visit: Payer: Self-pay | Admitting: Family Medicine

## 2021-02-05 DIAGNOSIS — I10 Essential (primary) hypertension: Secondary | ICD-10-CM

## 2021-02-05 DIAGNOSIS — E559 Vitamin D deficiency, unspecified: Secondary | ICD-10-CM

## 2021-02-05 DIAGNOSIS — E785 Hyperlipidemia, unspecified: Secondary | ICD-10-CM

## 2021-02-05 NOTE — Telephone Encounter (Signed)
Requested medication (s) are due for refill today:yes  Requested medication (s) are on the active medication list: yes  Last refill:  12/14/2020  Future visit scheduled: yes  Notes to clinic:  50,000 IU strengths are not delegated Other 2 meds are discontinued  Requested Prescriptions  Pending Prescriptions Disp Refills   olmesartan (BENICAR) 40 MG tablet [Pharmacy Med Name: OLMESARTAN MEDOXOMIL 40 MG TAB] 90 tablet 1    Sig: TAKE 1 TABLET (40 MG TOTAL) BY MOUTH DAILY. IN PLACE OF LISINOPRIL      Cardiovascular:  Angiotensin Receptor Blockers Failed - 02/05/2021  2:49 PM      Failed - Cr in normal range and within 180 days    Creat  Date Value Ref Range Status  02/06/2020 1.06 0.70 - 1.33 mg/dL Final    Comment:    For patients >53 years of age, the reference limit for Creatinine is approximately 13% higher for people identified as African-American. .    Creatinine, Urine  Date Value Ref Range Status  07/13/2016 CANCELED 20 - 370 mg/dL     Comment:    Test not performed, no urine was received.    Result canceled by the ancillary           Failed - K in normal range and within 180 days    Potassium  Date Value Ref Range Status  02/06/2020 3.8 3.5 - 5.3 mmol/L Final          Passed - Patient is not pregnant      Passed - Last BP in normal range    BP Readings from Last 1 Encounters:  01/15/21 138/86          Passed - Valid encounter within last 6 months    Recent Outpatient Visits           1 month ago Hyperlipidemia LDL goal <70   [facility] Steele Sizer, MD   5 months ago Need for immunization against influenza   [facility] Steele Sizer, MD   9 months ago Well adult exam   [facility] [place], Drue Stager, MD   1 year ago Hypertension goal BP (blood pressure) < 130/80   [facility] [place], Kristeen Miss, PA-C   1 year ago Uncontrolled type 2 diabetes mellitus with peripheral  circulatory disorder [facility])   [place], NP       Future Appointments             In 1 month Steele Sizer, MD [facility], [place]   In 2 months Steele Sizer, MD [facility], [facility]               metoprolol succinate (TOPROL-XL) 25 MG 24 hr tablet [Pharmacy Med Name: METOPROLOL SUCC ER 25 MG TAB] 90 tablet 1    Sig: TAKE 1 TABLET BY MOUTH EVERY DAY      Cardiovascular:  Beta Blockers Passed - 02/05/2021  2:49 PM      Passed - Last BP in normal range    BP Readings from Last 1 Encounters:  01/15/21 138/86          Passed - Last Heart Rate in normal range    Pulse Readings from Last 1 Encounters:  01/15/21 77          Passed - Valid encounter within last 6 months    Recent Outpatient Visits  1 month ago Hyperlipidemia LDL goal <70   [facility] Steele Sizer, MD   5 months ago Need for immunization against influenza   [facility] Steele Sizer, MD   9 months ago Well adult exam   [facility] Steele Sizer, MD   1 year ago Hypertension goal BP (blood pressure) < 130/80   [facility] [place], Kristeen Miss, PA-C   1 year ago Uncontrolled type 2 diabetes mellitus with peripheral circulatory disorder [facility])   [place], NP       Future Appointments             In 1 month Steele Sizer, MD [facility], [place]   In 2 months Steele Sizer, MD [facility], [facility]               Vitamin D, Ergocalciferol, (DRISDOL) 1.25 MG (50000 UNIT) CAPS capsule [Pharmacy Med Name: VITAMIN D2 1.25MG (50,000 UNIT)] 12 capsule 1    Sig: TAKE 1 CAPSULE (50,000 UNITS TOTAL) BY MOUTH EVERY 7 (SEVEN) DAYS.      Endocrinology:  Vitamins - Vitamin D Supplementation Failed - 02/05/2021  2:49 PM      Failed - 50,000 IU strengths are not delegated       Failed - Ca in normal range and within 360 days    Calcium  Date Value Ref Range Status  02/06/2020 9.5 8.6 - 10.3 mg/dL Final          Failed - Phosphate in normal range and within 360 days    No results found for: PHOS        Failed - Vitamin D in normal range and within 360 days    Vit D, 25-Hydroxy  Date Value Ref Range Status  04/29/2020 14 (L) 30 - 100 ng/mL Final    Comment:    Vitamin D Status         25-OH Vitamin D: . Deficiency:                    <20 ng/mL Insufficiency:             20 - 29 ng/mL Optimal:                 > or = 30 ng/mL . For 25-OH Vitamin D testing on patients on  D2-supplementation and patients for whom quantitation  of D2 and D3 fractions is required, the QuestAssureD(TM) 25-OH VIT D, (D2,D3), LC/MS/MS is recommended: order  code [phone] (patients >23yrs). See Note 1 . Note 1 . For additional information, please refer to  http://education.QuestDiagnostics.com/faq/FAQ199  (This link is being provided for informational/ educational purposes only.)           Passed - Valid encounter within last 12 months    Recent Outpatient Visits           1 month ago Hyperlipidemia LDL goal <70   [facility] Steele Sizer, MD   5 months ago Need for immunization against influenza   [facility] Steele Sizer, MD   9 months ago Well adult exam   [facility] Steele Sizer, MD   1 year ago Hypertension goal BP (blood pressure) < 130/80   [facility] [place], Kristeen Miss, PA-C   1 year ago Uncontrolled type 2 diabetes mellitus with peripheral circulatory disorder [facility])   [place],  [place], NP       Future Appointments             In 1 month Ancil Boozer, Drue Stager, MD [facility], [place]   In 2 months Steele Sizer, MD [facility], [facility]              Signed Prescriptions Disp Refills    olmesartan-hydrochlorothiazide (BENICAR HCT) 40-25 MG tablet 90 tablet 1    Sig: TAKE 1 TABLET BY MOUTH EVERY DAY      Cardiovascular: ARB + Diuretic Combos Failed - 02/05/2021  2:49 PM      Failed - K in normal range and within 180 days    Potassium  Date Value Ref Range Status  02/06/2020 3.8 3.5 - 5.3 mmol/L Final          Failed - Na in normal range and within 180 days    Sodium  Date Value Ref Range Status  02/06/2020 138 135 - 146 mmol/L Final          Failed - Cr in normal range and within 180 days    Creat  Date Value Ref Range Status  02/06/2020 1.06 0.70 - 1.33 mg/dL Final    Comment:    For patients >12 years of age, the reference limit for Creatinine is approximately 13% higher for people identified as African-American. .    Creatinine, Urine  Date Value Ref Range Status  07/13/2016 CANCELED 20 - 370 mg/dL     Comment:    Test not performed, no urine was received.    Result canceled by the ancillary           Failed - Ca in normal range and within 180 days    Calcium  Date Value Ref Range Status  02/06/2020 9.5 8.6 - 10.3 mg/dL Final          Passed - Patient is not pregnant      Passed - Last BP in normal range    BP Readings from Last 1 Encounters:  01/15/21 138/86          Passed - Valid encounter within last 6 months    Recent Outpatient Visits           1 month ago Hyperlipidemia LDL goal <70   [facility] Steele Sizer, MD   5 months ago Need for immunization against influenza   [facility] Steele Sizer, MD   9 months ago Well adult exam   [facility] [place], Drue Stager, MD   1 year ago Hypertension goal BP (blood pressure) < 130/80   [facility] [place], Kristeen Miss, PA-C   1 year ago Uncontrolled type 2 diabetes mellitus with peripheral circulatory disorder [facility])   [place], NP       Future Appointments              In 1 month Steele Sizer, MD [facility], [place]   In 2 months Steele Sizer, MD [facility], [facility]               rosuvastatin (CRESTOR) 40 MG tablet 90 tablet 1    Sig: TAKE 1 TABLET (40 MG TOTAL) BY MOUTH DAILY. IN PLACE OF ATORVASTATIN      Cardiovascular:  Antilipid - Statins Passed - 02/05/2021  2:49 PM      Passed - Total Cholesterol in normal range and  within 360 days    Cholesterol, Total  Date Value Ref Range Status  12/16/2015 205 (H) 100 - 199 mg/dL Final   Cholesterol  Date Value Ref Range Status  08/04/2020 132 0 - 200 Final    Comment:    [facility]          Passed - LDL in normal range and within 360 days    LDL Cholesterol (Calc)  Date Value Ref Range Status  02/06/2020 81 mg/dL (calc) Final    Comment:    Reference range: <100 . Desirable range <100 mg/dL for primary prevention;   <70 mg/dL for patients with CHD or diabetic patients  with > or = 2 CHD risk factors. Marland Kitchen LDL-C is now calculated using the Martin-Hopkins  calculation, which is a validated novel method providing  better accuracy than the Friedewald equation in the  estimation of LDL-C.  Cresenciano Genre et al. Annamaria Helling. 8563;149(70): 2061-2068  (http://education.QuestDiagnostics.com/faq/FAQ164)    LDL Cholesterol  Date Value Ref Range Status  08/04/2020 72  Final    Comment:    [facility]          Passed - HDL in normal range and within 360 days    HDL  Date Value Ref Range Status  08/04/2020 39 35 - 70 Final    Comment:    [facility]  12/16/2015 36 (L) >39 mg/dL Final          Passed - Triglycerides in normal range and within 360 days    Triglycerides  Date Value Ref Range Status  08/04/2020 106 40 - 160 Final    Comment:    [facility]          Passed - Patient is not pregnant      Passed - Valid encounter within last 12 months    Recent Outpatient Visits           1 month ago Hyperlipidemia LDL goal <70   [facility],  Drue Stager, MD   5 months ago Need for immunization against influenza   [facility] Steele Sizer, MD   9 months ago Well adult exam   [facility] [place], Drue Stager, MD   1 year ago Hypertension goal BP (blood pressure) < 130/80   [facility] [place], Kristeen Miss, PA-C   1 year ago Uncontrolled type 2 diabetes mellitus with peripheral circulatory disorder [facility])   [place], NP       Future Appointments             In 1 month Steele Sizer, MD [facility], [place]   In 2 months Steele Sizer, MD [facility], [facility]               nebivolol (BYSTOLIC) 10 MG tablet 90 tablet 0    Sig: TAKE 1 TABLET (10 MG TOTAL) BY MOUTH DAILY. IN PLACE OF METOPROLOL      Cardiovascular:  Beta Blockers Passed - 02/05/2021  2:49 PM      Passed - Last BP in normal range    BP Readings from Last 1 Encounters:  01/15/21 138/86          Passed - Last Heart Rate in normal range    Pulse Readings from Last 1 Encounters:  01/15/21 77          Passed - Valid encounter within last 6 months    Recent Outpatient Visits  1 month ago Hyperlipidemia LDL goal <70   [facility] Steele Sizer, MD   5 months ago Need for immunization against influenza   [facility] Steele Sizer, MD   9 months ago Well adult exam   [facility] Steele Sizer, MD   1 year ago Hypertension goal BP (blood pressure) < 130/80   [facility] D[facility]a Grana, PA-C   1 year ago Uncontrolled type 2 diabetes mellitus with peripheral circulatory disorder [facility])   [place], NP       Future Appointments             In 1 month Steele Sizer, MD [facility], [place]   In 2 months Steele Sizer, MD [facility], [facility]

## 2021-02-06 ENCOUNTER — Other Ambulatory Visit: Payer: Self-pay

## 2021-02-06 DIAGNOSIS — I10 Essential (primary) hypertension: Secondary | ICD-10-CM

## 2021-02-09 ENCOUNTER — Other Ambulatory Visit: Payer: Self-pay

## 2021-02-09 ENCOUNTER — Encounter (INDEPENDENT_AMBULATORY_CARE_PROVIDER_SITE_OTHER): Payer: BC Managed Care – PPO | Admitting: Ophthalmology

## 2021-02-09 DIAGNOSIS — H35033 Hypertensive retinopathy, bilateral: Secondary | ICD-10-CM

## 2021-02-09 DIAGNOSIS — E113313 Type 2 diabetes mellitus with moderate nonproliferative diabetic retinopathy with macular edema, bilateral: Secondary | ICD-10-CM

## 2021-02-09 DIAGNOSIS — H43813 Vitreous degeneration, bilateral: Secondary | ICD-10-CM

## 2021-02-09 DIAGNOSIS — I10 Essential (primary) hypertension: Secondary | ICD-10-CM

## 2021-03-05 NOTE — Progress Notes (Signed)
Name: Clarence Black   MRN: [id number]    DOB: Jan 24, 1966   Date:03/09/2021       Progress Note  Subjective  Chief Complaint  Follow Up  HPI  HTN: he has been taking medications , Norvac, Bystolic, Benicar/hctz and bp is at goal , denies chest pain or palpitation. No dizziness.   DMII: he sees Dr. Gabriel Carina, no recent episodes of hypoglycemia. He has retinopathy and sees Dr. West Pugh , last A1C was 7.2 % done 07/2020 , last urine micro done at Dr. Joycie Peek office was back to normal , he is on ARB, he also has dyslipidemia and is on statin therapy. He denies polyphagia, polydipsia or polyuria. LDL should be a little better - 72, but good cholesterol improved at 39  He has been taking Nos energy drinks - two cans daily - each can has 54 grams of carbs.  Obesity: BMI above 40, he is a truck driver, reviewed importance of life style modification , he gained another 5 lbs since last visit, he is eating balanced and is down to one energy drink daily and is now sugar free. Discussed Optivia   Anxiety: he states usually takes Buspar prn  only, gets very nervous before DOT.    OSA: he wears his CPAP most nights of the week, he skips at most once per week, he keeps the mask on as long as he sleeps He works from midnight until noon - 5 days a week, he is now taking modafinil and it helps him stay alert   Vitamin D deficiency: taking rx weekly medication , we will recheck labs today   Dyslipidemia: last LDL at 72, we adjusted from Atorvastatin to Rosuvastatin 40 , we will recheck level today   Onychomycosis right first toe: going on for a while, also has some maceration between toes. Discussed medication, we will check labs and if normal he will fill rx of terbinafine   Patient Active Problem List   Diagnosis Date Noted  . BPH with obstruction/lower urinary tract symptoms 04/29/2020  . 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 Vitamin D  deficiency 01/29/2019  . Sleep apnea 09/29/2018  . Umbilical hernia without obstruction and without gangrene 12/31/2016  . Ventral hernia without obstruction or gangrene 12/31/2016  . Low testosterone in male 10/22/2016  . Hyperlipidemia LDL goal <70 06/13/2015  . Hypertension goal BP (blood pressure) < 130/80 06/13/2015  . ED (erectile dysfunction) of organic origin 02/20/2015  . Obesity, Class III, BMI 40-49.9 (morbid obesity) ([place]) 02/20/2015    Past Surgical History:  Procedure Laterality Date  . COLONOSCOPY  2013  . COLONOSCOPY WITH PROPOFOL N/A 06/02/2018   Procedure: COLONOSCOPY WITH PROPOFOL;  Surgeon: Jonathon Bellows, MD;  Location: [facility] ENDOSCOPY;  Service: Gastroenterology;  Laterality: N/A;    Family History  Problem Relation Age of Onset  . Diabetes Mother   . Kidney disease Mother   . Diabetes Brother   . Diabetes Brother   . Diabetes Maternal Grandfather   . Hypertension Maternal Grandfather   . Stroke Maternal Grandfather     Social History   Tobacco Use  . Smoking status: Never Smoker  . Smokeless tobacco: Never Used  Substance Use Topics  . Alcohol use: No    Alcohol/week: 0.0 standard drinks     Current Outpatient Medications:  .  amLODipine (NORVASC) 10 MG tablet, Take 1 tablet (10 mg total) by mouth daily., Disp: 90 tablet, Rfl: 1 .  BESIVANCE 0.6 % SUSP, INSTILL ONE DROP INTO BOTH EYES 4 TIMES A DAY FOR 2 DAYS AFTER EACH MONTHLY EYE INJECTION, Disp: , Rfl:  .  brimonidine (ALPHAGAN) 0.15 % ophthalmic solution, SMARTSIG:In Eye(s), Disp: , Rfl:  .  busPIRone (BUSPAR) 5 MG tablet, TAKE 1 TABLET BY MOUTH TWICE A DAY AS NEEDED, Disp: 60 tablet, Rfl: 0 .  Dapagliflozin-metFORMIN HCl ER (XIGDUO XR) 04-999 MG TB24, Take 2 tablets by mouth daily., Disp: , Rfl:  .  glipiZIDE (GLUCOTROL XL) 10 MG 24 hr tablet, Take 1 tablet by mouth daily., Disp: , Rfl:  .  glucose blood (ONE TOUCH ULTRA TEST) test strip, Use as instructed, Disp: 300 each, Rfl: 3 .  Insulin Pen  Needle (B-D UF III MINI PEN NEEDLES) 31G X 5 MM MISC, FOR USE WITH INJECTABLE MEDICINE, ONCE A DAY, Disp: 90 each, Rfl: 3 .  modafinil (PROVIGIL) 100 MG tablet, TAKE 1 TABLET BY MOUTH EVERY DAY, Disp: 30 tablet, Rfl: 1 .  nebivolol (BYSTOLIC) 10 MG tablet, TAKE 1 TABLET (10 MG TOTAL) BY MOUTH DAILY. IN PLACE OF METOPROLOL, Disp: 90 tablet, Rfl: 0 .  olmesartan-hydrochlorothiazide (BENICAR HCT) 40-25 MG tablet, TAKE 1 TABLET BY MOUTH EVERY DAY, Disp: 90 tablet, Rfl: 1 .  ONETOUCH DELICA LANCETS FINE MISC, 1 Device by Does not apply route 3 (three) times daily., Disp: 300 each, Rfl: 3 .  OZEMPIC, 1 MG/DOSE, 4 MG/3ML SOPN, Inject 1 mg into the skin daily., Disp: , Rfl:  .  pioglitazone (ACTOS) 30 MG tablet, Take 30 mg by mouth daily., Disp: , Rfl:  .  rosuvastatin (CRESTOR) 40 MG tablet, TAKE 1 TABLET (40 MG TOTAL) BY MOUTH DAILY. IN PLACE OF ATORVASTATIN, Disp: 90 tablet, Rfl: 1 .  tadalafil (CIALIS) 20 MG tablet, Take 1 tablet (20 mg total) by mouth every three (3) days as needed for erectile dysfunction., Disp: 30 tablet, Rfl: 0 .  Vitamin D, Ergocalciferol, (DRISDOL) 1.25 MG (50000 UNIT) CAPS capsule, Take 1 capsule (50,000 Units total) by mouth every 7 (seven) days., Disp: 12 capsule, Rfl: 1  No Known Allergies  I personally reviewed active problem list, medication list, allergies, family history, social history, health maintenance with the patient/caregiver today.   ROS  Constitutional: Negative for fever or weight change.  Respiratory: Negative for cough and shortness of breath.   Cardiovascular: Negative for chest pain or palpitations.  Gastrointestinal: Negative for abdominal pain, no bowel changes.  Musculoskeletal: Negative for gait problem or joint swelling.  Skin: Negative for rash.  Neurological: Negative for dizziness or headache.  No other specific complaints in a complete review of systems (except as listed in HPI above).  Objective  Vitals:   03/09/21 0918  BP: 122/80   Pulse: 91  Resp: 18  Temp: 98.5 F (36.9 C)  TempSrc: Oral  SpO2: 97%  Weight: 299 lb 1.6 oz (135.7 kg)  Height: 5\' 10"  (1.778 m)    Body mass index is 42.92 kg/m.  Physical Exam  Constitutional: Patient appears well-developed and well-nourished. Obese  No distress.  HEENT: head atraumatic, normocephalic, pupils equal and reactive to light, neck supple Cardiovascular: Normal rate, regular rhythm and normal heart sounds.  No murmur heard. No BLE edema. Pulmonary/Chest: Effort normal and breath sounds normal. No respiratory distress. Abdominal: Soft.  There is no tenderness. Psychiatric: Patient has a normal mood and affect. behavior is normal. Judgment and thought content normal.  Recent Results (from the past 2160 hour(s))  Hemoglobin A1c  Status: None   Collection Time: 01/19/21 12:00 AM  Result Value Ref Range   Hemoglobin A1C 7.0     PHQ2/9: Depression screen [facility] 2/9 03/09/2021 12/08/2020 09/03/2020 04/29/2020 04/29/2020  Decreased Interest 0 0 0 0 0  Down, Depressed, Hopeless 0 0 0 0 0  PHQ - 2 Score 0 0 0 0 0  Altered sleeping 1 - - 1 0  Tired, decreased energy 0 - - 1 0  Change in appetite 0 - - 0 0  Feeling bad or failure about yourself  0 - - 0 0  Trouble concentrating 0 - - 0 0  Moving slowly or fidgety/restless 0 - - 0 0  Suicidal thoughts 0 - - 0 0  PHQ-9 Score 1 - - 2 0  Difficult doing work/chores Not difficult at all - - Not difficult at all -  Some recent data might be hidden    phq 9 is positive  Fall Risk: Fall Risk  03/09/2021 12/08/2020 09/03/2020 04/29/2020 02/06/2020  Falls in the past year? 0 0 0 0 0  Number falls in past yr: 0 0 0 0 0  Injury with Fall? 0 0 0 0 0    Functional Status Survey: Is the patient deaf or have difficulty hearing?: No Does the patient have difficulty seeing, even when wearing glasses/contacts?: No Does the patient have difficulty concentrating, remembering, or making decisions?: No Does the patient have difficulty  walking or climbing stairs?: No Does the patient have difficulty dressing or bathing?: No Does the patient have difficulty doing errands alone such as visiting a doctor's office or shopping?: No    Assessment & Plan  1. 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 OSA (obstructive sleep apnea)  - modafinil (PROVIGIL) 100 MG tablet; Take 1 tablet (100 mg total) by mouth daily.  Dispense: 90 tablet; Refill: 0  3. Hypertension goal BP (blood pressure) < 130/80  - nebivolol (BYSTOLIC) 10 MG tablet; Take 1 tablet (10 mg total) by mouth daily. In place of metoprolol  Dispense: 90 tablet; Refill: 0 - Olmesartan-amLODIPine-HCTZ 40-10-25 MG TABS; Take 1 tablet by mouth daily.  Dispense: 90 tablet; Refill: 1 - COMPLETE METABOLIC PANEL WITH GFR - Lipid panel  4. Vitamin D deficiency  - Vitamin D, Ergocalciferol, (DRISDOL) 1.25 MG (50000 UNIT) CAPS capsule; Take 1 capsule (50,000 Units total) by mouth every 7 (seven) days.  Dispense: 12 capsule; Refill: 1 - VITAMIN D 25 Hydroxy (Vit-D Deficiency, Fractures)  5. BPH without urinary obstruction   6. Shift work sleep disorder  - modafinil (PROVIGIL) 100 MG tablet; Take 1 tablet (100 mg total) by mouth daily.  Dispense: 90 tablet; Refill: 0  7. Screening for HIV (human immunodeficiency virus)  - HIV Antibody (routine testing w rflx)  8. Obesity, Class III, BMI 40-49.9 (morbid obesity) ([place])  Discussed with the patient the risk posed by an increased BMI. Discussed importance of portion control, calorie counting and at least 150 minutes of physical activity weekly. Avoid sweet beverages and drink more water. Eat at least 6 servings of fruit and vegetables daily   9. Hyperlipidemia LDL goal <70  - Lipid panel  10. Hypertension goal BP (blood pressure) < 130/80  - nebivolol (BYSTOLIC) 10 MG tablet; Take 1 tablet (10 mg total) by mouth  daily. In place of metoprolol  Dispense: 90 tablet; Refill: 0 - Olmesartan-amLODIPine-HCTZ 40-10-25 MG TABS; Take 1 tablet by mouth daily.  Dispense: 90 tablet; Refill: 1 - COMPLETE METABOLIC PANEL WITH GFR - Lipid panel  11. Other male erectile dysfunction  - tadalafil (CIALIS) 20 MG tablet; Take 1 tablet (20 mg total) by mouth every three (3) days as needed for erectile dysfunction.  Dispense: 30 tablet; Refill: 0  12. Onychomycosis  - terbinafine (LAMISIL) 250 MG tablet; Take 1 tablet (250 mg total) by mouth daily.  Dispense: 90 tablet; Refill: 0

## 2021-03-09 ENCOUNTER — Other Ambulatory Visit: Payer: Self-pay

## 2021-03-09 ENCOUNTER — Ambulatory Visit: Payer: BC Managed Care – PPO | Admitting: Family Medicine

## 2021-03-09 ENCOUNTER — Encounter: Payer: Self-pay | Admitting: Family Medicine

## 2021-03-09 VITALS — BP 122/80 | HR 91 | Temp 98.5°F | Resp 18 | Ht 70.0 in | Wt 299.1 lb

## 2021-03-09 DIAGNOSIS — B351 Tinea unguium: Secondary | ICD-10-CM

## 2021-03-09 DIAGNOSIS — E11311 Type 2 diabetes mellitus with unspecified diabetic retinopathy with macular edema: Secondary | ICD-10-CM | POA: Diagnosis not present

## 2021-03-09 DIAGNOSIS — G4733 Obstructive sleep apnea (adult) (pediatric): Secondary | ICD-10-CM | POA: Diagnosis not present

## 2021-03-09 DIAGNOSIS — I10 Essential (primary) hypertension: Secondary | ICD-10-CM

## 2021-03-09 DIAGNOSIS — G4726 Circadian rhythm sleep disorder, shift work type: Secondary | ICD-10-CM

## 2021-03-09 DIAGNOSIS — E559 Vitamin D deficiency, unspecified: Secondary | ICD-10-CM | POA: Diagnosis not present

## 2021-03-09 DIAGNOSIS — N528 Other male erectile dysfunction: Secondary | ICD-10-CM

## 2021-03-09 DIAGNOSIS — E785 Hyperlipidemia, unspecified: Secondary | ICD-10-CM

## 2021-03-09 DIAGNOSIS — Z114 Encounter for screening for human immunodeficiency virus [HIV]: Secondary | ICD-10-CM

## 2021-03-09 DIAGNOSIS — N400 Enlarged prostate without lower urinary tract symptoms: Secondary | ICD-10-CM

## 2021-03-09 MED ORDER — VITAMIN D (ERGOCALCIFEROL) 1.25 MG (50000 UNIT) PO CAPS: 50000.0000 [IU] | ORAL_CAPSULE | ORAL | 1 refills | Status: DC

## 2021-03-09 MED ORDER — MODAFINIL 100 MG PO TABS: 100.0000 mg | ORAL_TABLET | Freq: Every day | ORAL | 0 refills | Status: DC

## 2021-03-09 MED ORDER — NEBIVOLOL HCL 10 MG PO TABS: 10.0000 mg | ORAL_TABLET | Freq: Every day | ORAL | 0 refills | Status: DC

## 2021-03-09 MED ORDER — TADALAFIL 20 MG PO TABS
20.0000 mg | ORAL_TABLET | ORAL | 0 refills | Status: DC | PRN
Start: 2021-03-09 — End: 2021-12-14

## 2021-03-09 MED ORDER — OLMESARTAN-AMLODIPINE-HCTZ 40-10-25 MG PO TABS: 1.0000 | ORAL_TABLET | Freq: Every day | ORAL | 1 refills | Status: DC

## 2021-03-09 MED ORDER — AMLODIPINE BESYLATE 10 MG PO TABS: 10.0000 mg | ORAL_TABLET | Freq: Every day | ORAL | 1 refills | Status: DC

## 2021-03-09 MED ORDER — TERBINAFINE HCL 250 MG PO TABS: 250.0000 mg | ORAL_TABLET | Freq: Every day | ORAL | 0 refills | Status: DC

## 2021-03-09 NOTE — Patient Instructions (Signed)
Check out the diet called Optivia.

## 2021-03-10 LAB — COMPLETE METABOLIC PANEL WITH GFR
AG Ratio: 1.9 (calc) (ref 1.0–2.5)
ALT: 16 U/L (ref 9–46)
AST: 19 U/L (ref 10–35)
Albumin: 4.6 g/dL (ref 3.6–5.1)
Alkaline phosphatase (APISO): 45 U/L (ref 35–144)
BUN: 17 mg/dL (ref 7–25)
CO2: 29 mmol/L (ref 20–32)
Calcium: 9.8 mg/dL (ref 8.6–10.3)
Chloride: 103 mmol/L (ref 98–110)
Creat: 1.04 mg/dL (ref 0.70–1.33)
GFR, Est African American: 95 mL/min/{1.73_m2} (ref >=60)
GFR, Est Non African American: 82 mL/min/{1.73_m2} (ref >=60)
Globulin: 2.4 g/dL (calc) (ref 1.9–3.7)
Glucose, Bld: 122 mg/dL — ABNORMAL HIGH (ref 65–99)
Potassium: 4.3 mmol/L (ref 3.5–5.3)
Sodium: 141 mmol/L (ref 135–146)
Total Bilirubin: 0.6 mg/dL (ref 0.2–1.2)
Total Protein: 7 g/dL (ref 6.1–8.1)

## 2021-03-10 LAB — LIPID PANEL
Cholesterol: 112 mg/dL (ref <200)
HDL: 39 mg/dL — ABNORMAL LOW (ref >=40)
LDL Cholesterol (Calc): 53 mg/dL (calc)
Non-HDL Cholesterol (Calc): 73 mg/dL (calc) (ref <130)
Total CHOL/HDL Ratio: 2.9 (calc) (ref <5.0)
Triglycerides: 114 mg/dL (ref <150)

## 2021-03-10 LAB — VITAMIN D 25 HYDROXY (VIT D DEFICIENCY, FRACTURES): Vit D, 25-Hydroxy: 78 ng/mL (ref 30–100)

## 2021-03-10 LAB — HIV ANTIBODY (ROUTINE TESTING W REFLEX): HIV 1&2 Ab, 4th Generation: NONREACTIVE

## 2021-03-16 ENCOUNTER — Other Ambulatory Visit: Payer: Self-pay

## 2021-03-16 ENCOUNTER — Encounter (INDEPENDENT_AMBULATORY_CARE_PROVIDER_SITE_OTHER): Payer: BC Managed Care – PPO | Admitting: Ophthalmology

## 2021-03-16 DIAGNOSIS — I10 Essential (primary) hypertension: Secondary | ICD-10-CM

## 2021-03-16 DIAGNOSIS — E113313 Type 2 diabetes mellitus with moderate nonproliferative diabetic retinopathy with macular edema, bilateral: Secondary | ICD-10-CM | POA: Diagnosis not present

## 2021-03-16 DIAGNOSIS — H35033 Hypertensive retinopathy, bilateral: Secondary | ICD-10-CM | POA: Diagnosis not present

## 2021-03-16 DIAGNOSIS — H43813 Vitreous degeneration, bilateral: Secondary | ICD-10-CM | POA: Diagnosis not present

## 2021-04-20 ENCOUNTER — Other Ambulatory Visit: Payer: Self-pay

## 2021-04-20 ENCOUNTER — Encounter (INDEPENDENT_AMBULATORY_CARE_PROVIDER_SITE_OTHER): Payer: BC Managed Care – PPO | Admitting: Ophthalmology

## 2021-04-20 DIAGNOSIS — H35033 Hypertensive retinopathy, bilateral: Secondary | ICD-10-CM

## 2021-04-20 DIAGNOSIS — E113312 Type 2 diabetes mellitus with moderate nonproliferative diabetic retinopathy with macular edema, left eye: Secondary | ICD-10-CM

## 2021-04-20 DIAGNOSIS — E113291 Type 2 diabetes mellitus with mild nonproliferative diabetic retinopathy without macular edema, right eye: Secondary | ICD-10-CM

## 2021-04-20 DIAGNOSIS — I10 Essential (primary) hypertension: Secondary | ICD-10-CM | POA: Diagnosis not present

## 2021-04-20 DIAGNOSIS — H43813 Vitreous degeneration, bilateral: Secondary | ICD-10-CM

## 2021-04-21 ENCOUNTER — Encounter (INDEPENDENT_AMBULATORY_CARE_PROVIDER_SITE_OTHER): Payer: BC Managed Care – PPO | Admitting: Ophthalmology

## 2021-04-30 NOTE — Progress Notes (Addendum)
Name: Clarence Black   MRN: [id number]    DOB: 09-20-1966   Date:05/01/2021       Progress Note  Subjective  Chief Complaint  Annual Exam  HPI  Patient presents for annual CPE .  IPSS Questionnaire (AUA-7): Over the past month.   1)  How often have you had a sensation of not emptying your bladder completely after you finish urinating?  0 - Not at all  2)  How often have you had to urinate again less than two hours after you finished urinating? 0 - Not at all  3)  How often have you found you stopped and started again several times when you urinated?  0 - Not at all  4) How difficult have you found it to postpone urination?  0 - Not at all  5) How often have you had a weak urinary stream?  0 - Not at all  6) How often have you had to push or strain to begin urination?  0 - Not at all  7) How many times did you most typically get up to urinate from the time you went to bed until the time you got up in the morning?  0 - None  Total score:  0-7 mildly symptomatic   8-19 moderately symptomatic   20-35 severely symptomatic     Diet: he hs been cutting down on carbohydrates and decreasing portion size  Exercise: discussed increase to 150 minutes per week   Depression: phq 9 is negative Depression screen [facility] 2/9 05/01/2021 03/09/2021 12/08/2020 09/03/2020 04/29/2020  Decreased Interest 0 0 0 0 0  Down, Depressed, Hopeless 0 0 0 0 0  PHQ - 2 Score 0 0 0 0 0  Altered sleeping - 1 - - 1  Tired, decreased energy - 0 - - 1  Change in appetite - 0 - - 0  Feeling bad or failure about yourself  - 0 - - 0  Trouble concentrating - 0 - - 0  Moving slowly or fidgety/restless - 0 - - 0  Suicidal thoughts - 0 - - 0  PHQ-9 Score - 1 - - 2  Difficult doing work/chores - Not difficult at all - - Not difficult at all  Some recent data might be hidden    Hypertension:  BP Readings from Last 3 Encounters:  05/01/21 120/82  03/09/21 122/80  01/15/21 138/86    Obesity: Wt Readings from Last 3  Encounters:  05/01/21 295 lb (133.8 kg)  03/09/21 299 lb 1.6 oz (135.7 kg)  12/08/20 (!) 300 lb 12.8 oz (136.4 kg)   BMI Readings from Last 3 Encounters:  05/01/21 42.33 kg/m  03/09/21 42.92 kg/m  12/08/20 43.16 kg/m     Lipids:  Lab Results  Component Value Date   CHOL 112 03/09/2021   CHOL 132 08/04/2020   CHOL 139 02/06/2020   Lab Results  Component Value Date   HDL 39 (L) 03/09/2021   HDL 39 08/04/2020   HDL 39 (L) 02/06/2020   Lab Results  Component Value Date   LDLCALC 53 03/09/2021   LDLCALC 72 08/04/2020   LDLCALC 81 02/06/2020   Lab Results  Component Value Date   TRIG 114 03/09/2021   TRIG 106 08/04/2020   TRIG 95 02/06/2020   Lab Results  Component Value Date   CHOLHDL 2.9 03/09/2021   CHOLHDL 3.6 02/06/2020   CHOLHDL 3.1 01/29/2019   No results found for: LDLDIRECT Glucose:  Glucose, Bld  Date Value Ref  Range Status  03/09/2021 122 (H) 65 - 99 mg/dL Final    Comment:    .            Fasting reference interval . For someone without known diabetes, a glucose value between 100 and 125 mg/dL is consistent with prediabetes and should be confirmed with a follow-up test. .   02/06/2020 94 65 - 99 mg/dL Final    Comment:    .            Fasting reference interval .   01/29/2019 143 (H) 65 - 139 mg/dL Final    Comment:    .        Non-fasting reference interval .    Glucose-Capillary  Date Value Ref Range Status  06/02/2018 173 (H) 65 - 99 mg/dL Final    Flowsheet Row Office Visit from 09/03/2020 in [facility]  AUDIT-C Score 0      Married STD testing and prevention (HIV/chl/gon/syphilis): not interested  Hep C: 04/29/20  Skin cancer: Discussed monitoring for atypical lesions Colorectal cancer: 06/02/18 Prostate cancer:  Lab Results  Component Value Date   PSA 0.3 04/29/2020   PSA 0.3ng/dL 05/27/2015     Lung cancer: Low Dose CT Chest recommended if Age 53-80 years, 30 pack-year currently smoking OR  have quit w/in 15years. Patient does not qualify.   ECG:  05/05/20  Vaccines:   Shingrix: 53-64 yo and ask insurance if covered when patient above 35 yo Pneumonia: educated and discussed with patient. Flu: educated and discussed with patient.  Advanced Care Planning: A voluntary discussion about advance care planning including the explanation and discussion of advance directives.  Discussed health care proxy and Living will, and the patient was able to identify a health care proxy as wife     Patient Active Problem List   Diagnosis Date Noted  . BPH with obstruction/lower urinary tract symptoms 04/29/2020  . 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 Vitamin D deficiency 01/29/2019  . Sleep apnea 09/29/2018  . Umbilical hernia without obstruction and without gangrene 12/31/2016  . Ventral hernia without obstruction or gangrene 12/31/2016  . Low testosterone in male 10/22/2016  . Hyperlipidemia LDL goal <70 06/13/2015  . Hypertension goal BP (blood pressure) < 130/80 06/13/2015  . ED (erectile dysfunction) of organic origin 02/20/2015  . Obesity, Class III, BMI 40-49.9 (morbid obesity) ([place]) 02/20/2015    Past Surgical History:  Procedure Laterality Date  . COLONOSCOPY  2013  . COLONOSCOPY WITH PROPOFOL N/A 06/02/2018   Procedure: COLONOSCOPY WITH PROPOFOL;  Surgeon: Jonathon Bellows, MD;  Location: [facility] ENDOSCOPY;  Service: Gastroenterology;  Laterality: N/A;    Family History  Problem Relation Age of Onset  . Diabetes Mother   . Kidney disease Mother   . Diabetes Brother   . Diabetes Brother   . Diabetes Maternal Grandfather   . Hypertension Maternal Grandfather   . Stroke Maternal Grandfather     Social History   Socioeconomic History  . Marital status: Married    Spouse name: Jeanett Schlein   . Number of children: 3  . Years of education: Not on file  . Highest education level: Not on file  Occupational History  .  Occupation: driver   Tobacco Use  . Smoking status: Never Smoker  . Smokeless tobacco: Never Used  Vaping Use  . Vaping Use: Never used  Substance and Sexual Activity  . Alcohol use: No  Alcohol/week: 0.0 standard drinks  . Drug use: No  . Sexual activity: Yes    Partners: Female  Other Topics Concern  . Not on file  Social History Narrative  . Not on file   Social Determinants of Health   Financial Resource Strain: Low Risk   . Difficulty of Paying Living Expenses: Not hard at all  Food Insecurity: No Food Insecurity  . Worried About Charity fundraiser in the Last Year: Never true  . Ran Out of Food in the Last Year: Never true  Transportation Needs: No Transportation Needs  . Lack of Transportation (Medical): No  . Lack of Transportation (Non-Medical): No  Physical Activity: Insufficiently Active  . Days of Exercise per Week: 2 days  . Minutes of Exercise per Session: 40 min  Stress: No Stress Concern Present  . Feeling of Stress : Not at all  Social Connections: Socially Integrated  . Frequency of Communication with Friends and Family: More than three times a week  . Frequency of Social Gatherings with Friends and Family: Twice a week  . Attends Religious Services: More than 4 times per year  . Active Member of Clubs or Organizations: Yes  . Attends Archivist Meetings: More than 4 times per year  . Marital Status: Married  Human resources officer Violence: Not At Risk  . Fear of Current or Ex-Partner: No  . Emotionally Abused: No  . Physically Abused: No  . Sexually Abused: No     Current Outpatient Medications:  .  BESIVANCE 0.6 % SUSP, INSTILL ONE DROP INTO BOTH EYES 4 TIMES A DAY FOR 2 DAYS AFTER EACH MONTHLY EYE INJECTION, Disp: , Rfl:  .  brimonidine (ALPHAGAN) 0.15 % ophthalmic solution, SMARTSIG:In Eye(s), Disp: , Rfl:  .  busPIRone (BUSPAR) 5 MG tablet, TAKE 1 TABLET BY MOUTH TWICE A DAY AS NEEDED, Disp: 60 tablet, Rfl: 0 .  COVID-19 mRNA vaccine,  Pfizer, 30 MCG/0.3ML injection, USE AS DIRECTED, Disp: .3 mL, Rfl: 0 .  Dapagliflozin-metFORMIN HCl ER (XIGDUO XR) 04-999 MG TB24, Take 2 tablets by mouth daily., Disp: , Rfl:  .  glipiZIDE (GLUCOTROL XL) 10 MG 24 hr tablet, Take 1 tablet by mouth daily., Disp: , Rfl:  .  glucose blood (ONE TOUCH ULTRA TEST) test strip, Use as instructed, Disp: 300 each, Rfl: 3 .  Insulin Pen Needle (B-D UF III MINI PEN NEEDLES) 31G X 5 MM MISC, FOR USE WITH INJECTABLE MEDICINE, ONCE A DAY, Disp: 90 each, Rfl: 3 .  modafinil (PROVIGIL) 100 MG tablet, Take 1 tablet (100 mg total) by mouth daily., Disp: 90 tablet, Rfl: 0 .  nebivolol (BYSTOLIC) 10 MG tablet, Take 1 tablet (10 mg total) by mouth daily. In place of metoprolol, Disp: 90 tablet, Rfl: 0 .  Olmesartan-amLODIPine-HCTZ 40-10-25 MG TABS, Take 1 tablet by mouth daily., Disp: 90 tablet, Rfl: 1 .  ONETOUCH DELICA LANCETS FINE MISC, 1 Device by Does not apply route 3 (three) times daily., Disp: 300 each, Rfl: 3 .  OZEMPIC, 1 MG/DOSE, 4 MG/3ML SOPN, Inject 1 mg into the skin daily., Disp: , Rfl:  .  pioglitazone (ACTOS) 30 MG tablet, Take 30 mg by mouth daily., Disp: , Rfl:  .  rosuvastatin (CRESTOR) 40 MG tablet, TAKE 1 TABLET (40 MG TOTAL) BY MOUTH DAILY. IN PLACE OF ATORVASTATIN, Disp: 90 tablet, Rfl: 1 .  tadalafil (CIALIS) 20 MG tablet, Take 1 tablet (20 mg total) by mouth every three (3) days as needed for erectile  dysfunction., Disp: 30 tablet, Rfl: 0 .  terbinafine (LAMISIL) 250 MG tablet, Take 1 tablet (250 mg total) by mouth daily., Disp: 90 tablet, Rfl: 0 .  Vitamin D, Ergocalciferol, (DRISDOL) 1.25 MG (50000 UNIT) CAPS capsule, Take 1 capsule (50,000 Units total) by mouth every 7 (seven) days., Disp: 12 capsule, Rfl: 1  No Known Allergies   ROS  Constitutional: Negative for fever , positive for  weight change.  Respiratory: Negative for cough and shortness of breath.   Cardiovascular: Negative for chest pain or palpitations.  Gastrointestinal:  Negative for abdominal pain, no bowel changes.  Musculoskeletal: Negative for gait problem or joint swelling.  Skin: Negative for rash.  Neurological: Negative for dizziness or headache.  No other specific complaints in a complete review of systems (except as listed in HPI above).   Objective  Vitals:   05/01/21 1507  BP: 120/82  Pulse: 97  Resp: 16  Temp: 98 F (36.7 C)  TempSrc: Oral  SpO2: 98%  Weight: 295 lb (133.8 kg)  Height: 5\' 10"  (1.778 m)    Body mass index is 42.33 kg/m.  Physical Exam  Constitutional: Patient appears well-developed and well-nourished. No distress.  HENT: Head: Normocephalic and atraumatic. Ears: B TMs ok, no erythema or effusion; Nose: Not done  Mouth/Throat: not done  Eyes: Conjunctivae and EOM are normal. Pupils are equal, round, and reactive to light. No scleral icterus.  Neck: Normal range of motion. Neck supple. No JVD present. No thyromegaly present.  Cardiovascular: Normal rate, regular rhythm and normal heart sounds.  No murmur heard. No BLE edema. Pulmonary/Chest: Effort normal and breath sounds normal. No respiratory distress. Abdominal: Soft. Bowel sounds are normal, no distension. There is no tenderness. no masses, he has large umbilical hernia  MALE GENITALIA: Normal descended testes bilaterally, no masses palpated, no hernias, no lesions, no discharge RECTAL: Prostate normal size and consistency, no rectal masses or hemorrhoids   Musculoskeletal: Normal range of motion, no joint effusions. No gross deformities Neurological: he is alert and oriented to person, place, and time. No cranial nerve deficit. Coordination, balance, strength, speech and gait are normal.  Skin: Skin is warm and dry. No rash noted. No erythema.  Psychiatric: Patient has a normal mood and affect. behavior is normal. Judgment and thought content normal.  Recent Results (from the past 2160 hour(s))  VITAMIN D 25 Hydroxy (Vit-D Deficiency, Fractures)     Status:  None   Collection Time: 03/09/21 10:20 AM  Result Value Ref Range   Vit D, 25-Hydroxy 78 30 - 100 ng/mL    Comment: Vitamin D Status         25-OH Vitamin D: . Deficiency:                    <20 ng/mL Insufficiency:             20 - 29 ng/mL Optimal:                 > or = 30 ng/mL . For 25-OH Vitamin D testing on patients on  D2-supplementation and patients for whom quantitation  of D2 and D3 fractions is required, the QuestAssureD(TM) 25-OH VIT D, (D2,D3), LC/MS/MS is recommended: order  code [id number] (patients >7161yrs). See Note 1 . Note 1 . For additional information, please refer to  http://education.[facility].com/faq/FAQ199  (This link is being provided for informational/ educational purposes only.)   HIV Antibody (routine testing w rflx)     Status: None  Collection Time: 03/09/21 10:20 AM  Result Value Ref Range   HIV 1&2 Ab, 4th Generation NON-REACTIVE NON-REACTI    Comment: HIV-1 antigen and HIV-1/HIV-2 antibodies were not detected. There is no laboratory evidence of HIV infection. Marland Kitchen PLEASE NOTE: This information has been disclosed to you from records whose confidentiality may be protected by state law.  If your state requires such protection, then the state law prohibits you from making any further disclosure of the information without the specific written consent of the person to whom it pertains, or as otherwise permitted by law. A general authorization for the release of medical or other information is NOT sufficient for this purpose. . For additional information please refer to http://education.[facility].com/faq/FAQ106 (This link is being provided for informational/ educational purposes only.) . Marland Kitchen The performance of this assay has not been clinically validated in patients less than 13 years old. .   Lipid panel     Status: Abnormal   Collection Time: 03/09/21 10:20 AM  Result Value Ref Range   Cholesterol 112 <200 mg/dL   HDL 39 (L) > OR  = 40 mg/dL   Triglycerides 114 <150 mg/dL   LDL Cholesterol (Calc) 53 mg/dL (calc)    Comment: Reference range: <100 . Desirable range <100 mg/dL for primary prevention;   <70 mg/dL for patients with CHD or diabetic patients  with > or = 2 CHD risk factors. Marland Kitchen LDL-C is now calculated using the Martin-Hopkins  calculation, which is a validated novel method providing  better accuracy than the Friedewald equation in the  estimation of LDL-C.  Cresenciano Genre et al. Annamaria Helling. [id number]): 2061-2068  (http://education.[facility].com/faq/FAQ164)    Total CHOL/HDL Ratio 2.9 <5.0 (calc)   Non-HDL Cholesterol (Calc) 73 <130 mg/dL (calc)    Comment: For patients with diabetes plus 1 major ASCVD risk  factor, treating to a non-HDL-C goal of <100 mg/dL  (LDL-C of <70 mg/dL) is considered a therapeutic  option.   COMPLETE METABOLIC PANEL WITH GFR     Status: Abnormal   Collection Time: 03/09/21 10:20 AM  Result Value Ref Range   Glucose, Bld 122 (H) 65 - 99 mg/dL    Comment: .            Fasting reference interval . For someone without known diabetes, a glucose value between 100 and 125 mg/dL is consistent with prediabetes and should be confirmed with a follow-up test. .    BUN 17 7 - 25 mg/dL   Creat 1.04 0.70 - 1.33 mg/dL    Comment: For patients >73 years of age, the reference limit for Creatinine is approximately 13% higher for people identified as African-American. .    GFR, Est Non African American 82 > OR = 60 mL/min/1.42m2   GFR, Est African American 95 > OR = 60 mL/min/1.54m2   BUN/Creatinine Ratio NOT APPLICABLE 6 - 22 (calc)   Sodium 141 135 - 146 mmol/L   Potassium 4.3 3.5 - 5.3 mmol/L   Chloride 103 98 - 110 mmol/L   CO2 29 20 - 32 mmol/L   Calcium 9.8 8.6 - 10.3 mg/dL   Total Protein 7.0 6.1 - 8.1 g/dL   Albumin 4.6 3.6 - 5.1 g/dL   Globulin 2.4 1.9 - 3.7 g/dL (calc)   AG Ratio 1.9 1.0 - 2.5 (calc)   Total Bilirubin 0.6 0.2 - 1.2 mg/dL   Alkaline phosphatase  (APISO) 45 35 - 144 U/L   AST 19 10 - 35 U/L   ALT 16 9 -  46 U/L     Fall Risk: Fall Risk  05/01/2021 03/09/2021 12/08/2020 09/03/2020 04/29/2020  Falls in the past year? 0 0 0 0 0  Number falls in past yr: 0 0 0 0 0  Injury with Fall? 0 0 0 0 0      Functional Status Survey: Is the patient deaf or have difficulty hearing?: No Does the patient have difficulty seeing, even when wearing glasses/contacts?: No Does the patient have difficulty concentrating, remembering, or making decisions?: No Does the patient have difficulty walking or climbing stairs?: No Does the patient have difficulty dressing or bathing?: No Does the patient have difficulty doing errands alone such as visiting a doctor's office or shopping?: No    Assessment & Plan  1. Well adult exam   2. Need for shingles vaccine  - Varicella-zoster vaccine IM  3. Prostate cancer screening  - PSA  -Prostate cancer screening and PSA options (with potential risks and benefits of testing vs not testing) were discussed along with recent recs/guidelines. -USPSTF grade A and B recommendations reviewed with patient; age-appropriate recommendations, preventive care, screening tests, etc discussed and encouraged; healthy living encouraged; see AVS for patient education given to patient -Discussed importance of 150 minutes of physical activity weekly, eat two servings of fish weekly, eat one serving of tree nuts ( cashews, pistachios, pecans, almonds.Marland Kitchen) every other day, eat 6 servings of fruit/vegetables daily and drink plenty of water and avoid sweet beverages.

## 2021-04-30 NOTE — Patient Instructions (Signed)

## 2021-05-01 ENCOUNTER — Ambulatory Visit (INDEPENDENT_AMBULATORY_CARE_PROVIDER_SITE_OTHER): Payer: BC Managed Care – PPO | Admitting: Family Medicine

## 2021-05-01 ENCOUNTER — Encounter: Payer: Self-pay | Admitting: Family Medicine

## 2021-05-01 ENCOUNTER — Other Ambulatory Visit: Payer: Self-pay

## 2021-05-01 VITALS — BP 120/82 | HR 97 | Temp 98.0°F | Resp 16 | Ht 70.0 in | Wt 295.0 lb

## 2021-05-01 DIAGNOSIS — Z0000 Encounter for general adult medical examination without abnormal findings: Secondary | ICD-10-CM | POA: Diagnosis not present

## 2021-05-01 DIAGNOSIS — Z125 Encounter for screening for malignant neoplasm of prostate: Secondary | ICD-10-CM | POA: Diagnosis not present

## 2021-05-01 DIAGNOSIS — Z23 Encounter for immunization: Secondary | ICD-10-CM

## 2021-05-02 LAB — PSA: PSA: 0.28 ng/mL (ref <=4.00)

## 2021-06-01 ENCOUNTER — Encounter (INDEPENDENT_AMBULATORY_CARE_PROVIDER_SITE_OTHER): Payer: BC Managed Care – PPO | Admitting: Ophthalmology

## 2021-06-05 ENCOUNTER — Other Ambulatory Visit: Payer: Self-pay

## 2021-06-05 ENCOUNTER — Encounter (INDEPENDENT_AMBULATORY_CARE_PROVIDER_SITE_OTHER): Payer: BC Managed Care – PPO | Admitting: Ophthalmology

## 2021-06-05 DIAGNOSIS — E113313 Type 2 diabetes mellitus with moderate nonproliferative diabetic retinopathy with macular edema, bilateral: Secondary | ICD-10-CM | POA: Diagnosis not present

## 2021-06-05 DIAGNOSIS — H35033 Hypertensive retinopathy, bilateral: Secondary | ICD-10-CM | POA: Diagnosis not present

## 2021-06-05 DIAGNOSIS — I10 Essential (primary) hypertension: Secondary | ICD-10-CM | POA: Diagnosis not present

## 2021-06-05 DIAGNOSIS — H43813 Vitreous degeneration, bilateral: Secondary | ICD-10-CM | POA: Diagnosis not present

## 2021-06-09 NOTE — Progress Notes (Signed)
Name: Clarence Black   MRN: [id number]    DOB: 10-12-1966   Date:06/10/2021       Progress Note  Subjective  Chief Complaint  Follow up   HPI HTN: he has been taking medications , Norvac, Bystolic, Benicar/hctz and bp is at goal but higher than last visit , denies chest pain or palpitation. No dizziness.   DMII: he sees Dr. Gabriel Carina, no recent episodes of hypoglycemia. He has retinopathy and sees Dr. West Pugh , last A1C was 7.0% done 12/2020 , last urine micro done at Dr. Joycie Peek office was back to normal , he is on ARB, he also has dyslipidemia and is on statin therapy. He denies polyphagia, polydipsia or polyuria. LDL down to 53 and at goal, continue current medications. Denies hypoglycemic episodes and his glucose average past 30 days on his glucose meter was 112  Obesity: BMI above 40, he is a truck driver, reviewed importance of life style modification , he states he has been more physically active, moving for 30 minutes about 4 times a week, and trying to eat healthier , his weight is dow 5 lbs since March   Anxiety: he states usually takes Buspar prn  only, gets very nervous before DOT. Unchanged    OSA: he wears his CPAP most nights of the week, he skips at most once per week, he keeps the mask on as long as he sleeps He works from midnight until noon - 5 days a week, he is now taking modafinil but would like to increase dose from 100 mg to 200 mg since he is working from 11:45 pm until 12 pm , we will adjust and monitor   Vitamin D deficiency: taking rx weekly medication . Unchanged   Dyslipidemia: we adjusted from Atorvastatin to Rosuvastatin 40 , LDL went from 72 to 53 with change of therapy   Onychomycosis right first toe: going on for a while, also has some maceration between toes. 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 ([place]) 08/06/2019   Vitamin D deficiency 01/29/2019   Sleep apnea 10/20/8526   Umbilical hernia without obstruction and without gangrene 12/31/2016   Ventral hernia without obstruction or gangrene 12/31/2016   Low testosterone in male 10/22/2016   Hyperlipidemia LDL goal <70 06/13/2015   Hypertension goal BP (blood pressure) < 130/80 06/13/2015   ED (erectile dysfunction) of organic origin 02/20/2015   Obesity, Class III, BMI 40-49.9 (morbid obesity) ([place]) 02/20/2015    Past Surgical History:  Procedure Laterality Date   COLONOSCOPY  2013   COLONOSCOPY WITH PROPOFOL N/A 06/02/2018   Procedure: COLONOSCOPY WITH PROPOFOL;  Surgeon: Jonathon Bellows, MD;  Location: [facility] ENDOSCOPY;  Service: Gastroenterology;  Laterality: N/A;    Family History  Problem Relation Age of Onset   Diabetes Mother    Kidney disease Mother    Diabetes Brother    Diabetes Brother    Diabetes Maternal Grandfather    Hypertension Maternal Grandfather    Stroke Maternal Grandfather     Social History   Tobacco Use   Smoking status: Never   Smokeless tobacco: Never  Substance Use Topics   Alcohol use: No    Alcohol/week: 0.0 standard drinks     Current Outpatient Medications:    BESIVANCE 0.6 %  SUSP, INSTILL ONE DROP INTO BOTH EYES 4 TIMES A DAY FOR 2 DAYS AFTER EACH MONTHLY EYE INJECTION, Disp: , Rfl:    brimonidine (ALPHAGAN) 0.15 % ophthalmic solution, SMARTSIG:In Eye(s), Disp: , Rfl:    busPIRone (BUSPAR) 5 MG tablet, TAKE 1 TABLET BY MOUTH TWICE A DAY AS NEEDED, Disp: 60 tablet, Rfl: 0   Dapagliflozin-metFORMIN HCl ER (XIGDUO XR) 04-999 MG TB24, Take 2 tablets by mouth daily., Disp: , Rfl:    glipiZIDE (GLUCOTROL XL) 10 MG 24 hr tablet, Take 1 tablet by mouth daily., Disp: , Rfl:    glucose blood (ONE TOUCH ULTRA TEST) test strip, Use as instructed, Disp: 300 each, Rfl: 3   Insulin Pen Needle (B-D UF III MINI PEN NEEDLES)  31G X 5 MM MISC, FOR USE WITH INJECTABLE MEDICINE, ONCE A DAY, Disp: 90 each, Rfl: 3   modafinil (PROVIGIL) 100 MG tablet, Take 1 tablet (100 mg total) by mouth daily., Disp: 90 tablet, Rfl: 0   nebivolol (BYSTOLIC) 10 MG tablet, Take 1 tablet (10 mg total) by mouth daily. In place of metoprolol, Disp: 90 tablet, Rfl: 0   Olmesartan-amLODIPine-HCTZ 40-10-25 MG TABS, Take 1 tablet by mouth daily., Disp: 90 tablet, Rfl: 1   ONETOUCH DELICA LANCETS FINE MISC, 1 Device by Does not apply route 3 (three) times daily., Disp: 300 each, Rfl: 3   OZEMPIC, 1 MG/DOSE, 4 MG/3ML SOPN, Inject 1 mg into the skin daily., Disp: , Rfl:    pioglitazone (ACTOS) 30 MG tablet, Take 30 mg by mouth daily., Disp: , Rfl:    rosuvastatin (CRESTOR) 40 MG tablet, TAKE 1 TABLET (40 MG TOTAL) BY MOUTH DAILY. IN PLACE OF ATORVASTATIN, Disp: 90 tablet, Rfl: 1   tadalafil (CIALIS) 20 MG tablet, Take 1 tablet (20 mg total) by mouth every three (3) days as needed for erectile dysfunction., Disp: 30 tablet, Rfl: 0   terbinafine (LAMISIL) 250 MG tablet, Take 1 tablet (250 mg total) by mouth daily., Disp: 90 tablet, Rfl: 0   Vitamin D, Ergocalciferol, (DRISDOL) 1.25 MG (50000 UNIT) CAPS capsule, Take 1 capsule (50,000 Units total) by mouth every 7 (seven) days., Disp: 12 capsule, Rfl: 1  No Known Allergies  I personally reviewed active problem list, medication list, allergies, family history, social history, health maintenance with the patient/caregiver today.   ROS  Constitutional: Negative for fever or  significant weight change.  Respiratory: Negative for cough and shortness of breath.   Cardiovascular: Negative for chest pain or palpitations.  Gastrointestinal: Negative for abdominal pain, no bowel changes.  Musculoskeletal: Negative for gait problem or joint swelling.  Skin: Negative for rash.  Neurological: Negative for dizziness or headache.  No other specific complaints in a complete review of systems (except as listed in  HPI above).   Objective  Vitals:   06/10/21 1415  BP: 134/80  Pulse: 93  Resp: 16  Temp: 97.9 F (36.6 C)  TempSrc: Oral  SpO2: 97%  Weight: 294 lb (133.4 kg)  Height: 5\' 10"  (1.778 m)    Body mass index is 42.18 kg/m.  Physical Exam  Constitutional: Patient appears well-developed and well-nourished. Obese  No distress.  HEENT: head atraumatic, normocephalic, pupils equal and reactive to light,neck supple,  Cardiovascular: Normal rate, regular rhythm and normal heart sounds.  No murmur heard. No BLE edema. Pulmonary/Chest: Effort normal and breath sounds normal. No respiratory distress. Abdominal: Soft.  There is no tenderness. Psychiatric: Patient has a normal mood and affect. behavior is normal. Judgment and  thought content normal.   Recent Results (from the past 2160 hour(s))  PSA     Status: None   Collection Time: 05/01/21  3:37 PM  Result Value Ref Range   PSA 0.28 < OR = 4.00 ng/mL    Comment: The total PSA value from this assay system is  standardized against the WHO standard. The test  result will be approximately 20% lower when compared  to the equimolar-standardized total PSA (Beckman  Coulter). Comparison of serial PSA results should be  interpreted with this fact in mind. . This test was performed using the Siemens  chemiluminescent method. Values obtained from  different assay methods cannot be used interchangeably. PSA levels, regardless of value, should not be interpreted as absolute evidence of the presence or absence of disease.       PHQ2/9: Depression screen [facility] 2/9 06/10/2021 05/01/2021 03/09/2021 12/08/2020 09/03/2020  Decreased Interest 0 0 0 0 0  Down, Depressed, Hopeless 0 0 0 0 0  PHQ - 2 Score 0 0 0 0 0  Altered sleeping 0 - 1 - -  Tired, decreased energy 0 - 0 - -  Change in appetite 0 - 0 - -  Feeling bad or failure about yourself  0 - 0 - -  Trouble concentrating 0 - 0 - -  Moving slowly or fidgety/restless 0 - 0 - -  Suicidal  thoughts 0 - 0 - -  PHQ-9 Score 0 - 1 - -  Difficult doing work/chores - - Not difficult at all - -  Some recent data might be hidden    phq 9 is negative   Fall Risk: Fall Risk  06/10/2021 05/01/2021 03/09/2021 12/08/2020 09/03/2020  Falls in the past year? 0 0 0 0 0  Number falls in past yr: 0 0 0 0 0  Injury with Fall? 0 0 0 0 0     Functional Status Survey: Is the patient deaf or have difficulty hearing?: No Does the patient have difficulty seeing, even when wearing glasses/contacts?: No Does the patient have difficulty concentrating, remembering, or making decisions?: No Does the patient have difficulty walking or climbing stairs?: No Does the patient have difficulty dressing or bathing?: No Does the patient have difficulty doing errands alone such as visiting a doctor's office or shopping?: No   Assessment & Plan  1. OSA (obstructive sleep apnea)  - modafinil (PROVIGIL) 200 MG tablet; Take 1 tablet (200 mg total) by mouth daily.  Dispense: 90 tablet; Refill: 0  2. Hypertension goal BP (blood pressure) < 130/80  - nebivolol 20 MG TABS; Take 1 tablet (20 mg total) by mouth daily. In place of metoprolol  Dispense: 90 tablet; Refill: 1  3. Shift work sleep disorder  - modafinil (PROVIGIL) 200 MG tablet; Take 1 tablet (200 mg total) by mouth daily.  Dispense: 90 tablet; Refill: 0  4. Obesity, Class III, BMI 40-49.9 (morbid obesity) ([place])  Discussed with the patient the risk posed by an increased BMI. Discussed importance of portion control, calorie counting and at least 150 minutes of physical activity weekly. Avoid sweet beverages and drink more water. Eat at least 6 servings of fruit and vegetables daily    5. Hyperlipidemia LDL goal <70  - rosuvastatin (CRESTOR) 40 MG tablet; Take 1 tablet (40 mg total) by mouth daily.  Dispense: 90 tablet; Refill: 1  6. Obstructive sleep apnea syndrome   7. Diabetes mellitus type 2 in obese ([place])   8. Dyslipidemia due to type 2  diabetes mellitus ([place])   9. Hypertension associated with type 2 diabetes mellitus ([place])

## 2021-06-10 ENCOUNTER — Ambulatory Visit: Payer: BC Managed Care – PPO | Admitting: Family Medicine

## 2021-06-10 ENCOUNTER — Other Ambulatory Visit: Payer: Self-pay | Admitting: Family Medicine

## 2021-06-10 ENCOUNTER — Other Ambulatory Visit: Payer: Self-pay

## 2021-06-10 ENCOUNTER — Encounter: Payer: Self-pay | Admitting: Family Medicine

## 2021-06-10 VITALS — BP 134/80 | HR 93 | Temp 97.9°F | Resp 16 | Ht 70.0 in | Wt 294.0 lb

## 2021-06-10 DIAGNOSIS — I10 Essential (primary) hypertension: Secondary | ICD-10-CM | POA: Diagnosis not present

## 2021-06-10 DIAGNOSIS — G4726 Circadian rhythm sleep disorder, shift work type: Secondary | ICD-10-CM

## 2021-06-10 DIAGNOSIS — B351 Tinea unguium: Secondary | ICD-10-CM

## 2021-06-10 DIAGNOSIS — G4733 Obstructive sleep apnea (adult) (pediatric): Secondary | ICD-10-CM | POA: Diagnosis not present

## 2021-06-10 DIAGNOSIS — E785 Hyperlipidemia, unspecified: Secondary | ICD-10-CM | POA: Insufficient documentation

## 2021-06-10 DIAGNOSIS — I152 Hypertension secondary to endocrine disorders: Secondary | ICD-10-CM

## 2021-06-10 DIAGNOSIS — E669 Obesity, unspecified: Secondary | ICD-10-CM | POA: Insufficient documentation

## 2021-06-10 DIAGNOSIS — E1169 Type 2 diabetes mellitus with other specified complication: Secondary | ICD-10-CM | POA: Insufficient documentation

## 2021-06-10 DIAGNOSIS — E1159 Type 2 diabetes mellitus with other circulatory complications: Secondary | ICD-10-CM | POA: Insufficient documentation

## 2021-06-10 MED ORDER — ROSUVASTATIN CALCIUM 40 MG PO TABS: 40.0000 mg | ORAL_TABLET | Freq: Every day | ORAL | 1 refills | Status: DC

## 2021-06-10 MED ORDER — MODAFINIL 200 MG PO TABS: 200.0000 mg | ORAL_TABLET | Freq: Every day | ORAL | 0 refills | Status: DC

## 2021-06-10 MED ORDER — NEBIVOLOL HCL 20 MG PO TABS: 20.0000 mg | ORAL_TABLET | Freq: Every day | ORAL | 1 refills | Status: DC

## 2021-06-10 NOTE — Telephone Encounter (Signed)
Requested medication (s) are due for refill today:  yes  Requested medication (s) are on the active medication list: yes  Last refill: 03/09/2021  Future visit scheduled: yes  Notes to clinic: Medication not assigned to a protocol, review manually   Requested Prescriptions  Pending Prescriptions Disp Refills   terbinafine (LAMISIL) 250 MG tablet [Pharmacy Med Name: TERBINAFINE HCL 250 MG TABLET] 90 tablet 0    Sig: TAKE 1 TABLET BY MOUTH EVERY DAY      Off-Protocol Failed - 06/10/2021 12:36 PM      Failed - Medication not assigned to a protocol, review manually.      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, unspecified laterality, unspecified retinopathy severity [facility])   [facility] Steele Sizer, MD   6 months ago Hyperlipidemia LDL goal <70   [facility] Steele Sizer, MD   9 months ago Need for immunization against influenza   [facility] Steele Sizer, MD   1 year ago Well adult exam   [facility] Steele Sizer, MD       Future Appointments             Today Steele Sizer, MD [facility], [place]   In 11 months Steele Sizer, MD [facility] [facility]

## 2021-06-24 ENCOUNTER — Other Ambulatory Visit: Payer: Self-pay

## 2021-06-24 ENCOUNTER — Encounter: Payer: Self-pay | Admitting: Podiatry

## 2021-06-24 ENCOUNTER — Ambulatory Visit (INDEPENDENT_AMBULATORY_CARE_PROVIDER_SITE_OTHER): Payer: BC Managed Care – PPO | Admitting: Podiatry

## 2021-06-24 DIAGNOSIS — Q828 Other specified congenital malformations of skin: Secondary | ICD-10-CM

## 2021-06-24 DIAGNOSIS — M7752 Other enthesopathy of left foot: Secondary | ICD-10-CM

## 2021-06-24 MED ORDER — DEXAMETHASONE SODIUM PHOSPHATE 120 MG/30ML IJ SOLN
2.0000 mg | Freq: Once | INTRAMUSCULAR | Status: AC
  Administered 2021-06-24: 2 mg via INTRA_ARTICULAR

## 2021-06-24 NOTE — Progress Notes (Signed)
Subjective:  Patient ID: Clarence Black, male    DOB: 1966-02-07,  MRN: [id number] HPI Chief Complaint  Patient presents with   Diabetes    Diabetic Foot Exam - rough, callused area sub 5th met left and feeling some numbness in feet, last a1c was 7.0    55 y.o. male presents with the above complaint.   ROS: Denies fever chills nausea vomiting muscle aches pains calf pain back pain chest pain shortness of breath.  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   Hyperlipidemia LDL goal <100 06/13/2015   Hypertension    Hypertension goal BP (blood pressure) < 130/80 06/13/2015   Hypogonadism, male    Obesity, Class III, BMI 40-49.9 (morbid obesity) ([place]) 02/20/2015   Sleep apnea 09/29/2018   Type 2 diabetes mellitus ([place]) 10/01/2015   Uncontrolled type 2 diabetes mellitus with peripheral circulatory disorder ([place]) 06/13/2015   Past Surgical History:  Procedure Laterality Date   COLONOSCOPY  2013   COLONOSCOPY WITH PROPOFOL N/A 06/02/2018   Procedure: COLONOSCOPY WITH PROPOFOL;  Surgeon: Jonathon Bellows, MD;  Location: [facility] ENDOSCOPY;  Service: Gastroenterology;  Laterality: N/A;    Current Outpatient Medications:    BESIVANCE 0.6 % SUSP, INSTILL ONE DROP INTO BOTH EYES 4 TIMES A DAY FOR 2 DAYS AFTER EACH MONTHLY EYE INJECTION, Disp: , Rfl:    brimonidine (ALPHAGAN) 0.15 % ophthalmic solution, SMARTSIG:In Eye(s), Disp: , Rfl:    busPIRone (BUSPAR) 5 MG tablet, TAKE 1 TABLET BY MOUTH TWICE A DAY AS NEEDED, Disp: 60 tablet, Rfl: 0   Dapagliflozin-metFORMIN HCl ER (XIGDUO XR) 04-999 MG TB24, Take 2 tablets by mouth daily., Disp: , Rfl:    glipiZIDE (GLUCOTROL XL) 10 MG 24 hr tablet, Take 1 tablet by mouth daily., Disp: , Rfl:    glucose blood (ONE TOUCH ULTRA TEST) test strip, Use as instructed, Disp: 300 each, Rfl:  3   Insulin Pen Needle (B-D UF III MINI PEN NEEDLES) 31G X 5 MM MISC, FOR USE WITH INJECTABLE MEDICINE, ONCE A DAY, Disp: 90 each, Rfl: 3   modafinil (PROVIGIL) 200 MG tablet, Take 1 tablet (200 mg total) by mouth daily., Disp: 90 tablet, Rfl: 0   nebivolol 20 MG TABS, Take 1 tablet (20 mg total) by mouth daily. In place of metoprolol, Disp: 90 tablet, Rfl: 1   Olmesartan-amLODIPine-HCTZ 40-10-25 MG TABS, Take 1 tablet by mouth daily., Disp: 90 tablet, Rfl: 1   ONETOUCH DELICA LANCETS FINE MISC, 1 Device by Does not apply route 3 (three) times daily., Disp: 300 each, Rfl: 3   OZEMPIC, 1 MG/DOSE, 4 MG/3ML SOPN, Inject 1 mg into the skin daily., Disp: , Rfl:    pioglitazone (ACTOS) 30 MG tablet, Take 30 mg by mouth daily., Disp: , Rfl:    rosuvastatin (CRESTOR) 40 MG tablet, Take 1 tablet (40 mg total) by mouth daily., Disp: 90 tablet, Rfl: 1   tadalafil (CIALIS) 20 MG tablet, Take 1 tablet (20 mg total) by mouth every three (3) days as needed for erectile dysfunction., Disp: 30 tablet, Rfl: 0   terbinafine (LAMISIL) 250 MG tablet, Take 1 tablet (250 mg total) by mouth daily., Disp: 90 tablet, Rfl: 0   Vitamin D, Ergocalciferol, (DRISDOL) 1.25 MG (50000 UNIT) CAPS capsule, Take 1 capsule (50,000 Units total) by mouth every 7 (seven)  days., Disp: 12 capsule, Rfl: 1  No Known Allergies Review of Systems Objective:  There were no vitals filed for this visit.  General: Well developed, nourished, in no acute distress, alert and oriented x3   Dermatological: Skin is warm, dry and supple bilateral. Nails x 10 are well maintained; remaining integument appears unremarkable at this time. There are no open sores, no preulcerative lesions, no rash or signs of infection present.  Vascular: Dorsalis Pedis artery and Posterior Tibial artery pedal pulses are 2/4 bilateral with immedate capillary fill time. Pedal hair growth present. No varicosities and no lower extremity edema present bilateral.   Neruologic:  Grossly intact via light touch bilateral. Vibratory intact via tuning fork bilateral. Protective threshold with Semmes Wienstein monofilament intact to all pedal sites bilateral. Patellar and Achilles deep tendon reflexes 2+ bilateral. No Babinski or clonus noted bilateral.   Musculoskeletal: No gross boney pedal deformities bilateral. No pain, crepitus, or limitation noted with foot and ankle range of motion bilateral. Muscular strength 5/5 in all groups tested bilateral.  Painful palpable bursa beneath the fifth metatarsal head of the left foot with overlying reactive hyperkeratosis.  Gait: Unassisted, Nonantalgic.    Radiographs:  None taken  Assessment & Plan:   Assessment: Diabetes mellitus with no complications.  Injected the bursa today subphase left and debrided the reactive hyperkeratotic tissue  Plan: Bursitis benign skin lesion injected in debrided today.     [name] T. [place], [place]

## 2021-06-28 ENCOUNTER — Other Ambulatory Visit: Payer: Self-pay | Admitting: Family Medicine

## 2021-06-28 DIAGNOSIS — B351 Tinea unguium: Secondary | ICD-10-CM

## 2021-06-28 NOTE — Telephone Encounter (Signed)
Requested medication (s) are due for refill today: yes  Requested medication (s) are on the active medication list: yes  Last refill:  03/09/21  Future visit scheduled: yes  Notes to clinic:  med not assigned to a protocol   Requested Prescriptions  Pending Prescriptions Disp Refills   terbinafine (LAMISIL) 250 MG tablet [Pharmacy Med Name: TERBINAFINE HCL 250 MG TABLET] 90 tablet 0    Sig: TAKE 1 TABLET BY MOUTH EVERY DAY      Off-Protocol Failed - 06/28/2021  2:47 PM      Failed - Medication not assigned to a protocol, review manually.      Passed - Valid encounter within last 12 months    Recent Outpatient Visits           2 weeks ago OSA (obstructive sleep apnea)   [facility] Steele Sizer, MD   1 month ago Well adult exam   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

## 2021-06-30 NOTE — Telephone Encounter (Signed)
Last seen 6.15.2022 next sch'd 9.19.2022

## 2021-07-10 ENCOUNTER — Other Ambulatory Visit: Payer: Self-pay

## 2021-07-10 ENCOUNTER — Encounter (INDEPENDENT_AMBULATORY_CARE_PROVIDER_SITE_OTHER): Payer: BC Managed Care – PPO | Admitting: Ophthalmology

## 2021-07-10 DIAGNOSIS — H35033 Hypertensive retinopathy, bilateral: Secondary | ICD-10-CM | POA: Diagnosis not present

## 2021-07-10 DIAGNOSIS — E113312 Type 2 diabetes mellitus with moderate nonproliferative diabetic retinopathy with macular edema, left eye: Secondary | ICD-10-CM | POA: Diagnosis not present

## 2021-07-10 DIAGNOSIS — E113291 Type 2 diabetes mellitus with mild nonproliferative diabetic retinopathy without macular edema, right eye: Secondary | ICD-10-CM

## 2021-07-10 DIAGNOSIS — I10 Essential (primary) hypertension: Secondary | ICD-10-CM | POA: Diagnosis not present

## 2021-07-10 DIAGNOSIS — H43813 Vitreous degeneration, bilateral: Secondary | ICD-10-CM

## 2021-07-17 ENCOUNTER — Encounter (INDEPENDENT_AMBULATORY_CARE_PROVIDER_SITE_OTHER): Payer: BC Managed Care – PPO | Admitting: Ophthalmology

## 2021-07-22 ENCOUNTER — Other Ambulatory Visit: Payer: Self-pay | Admitting: Family Medicine

## 2021-07-22 DIAGNOSIS — I10 Essential (primary) hypertension: Secondary | ICD-10-CM

## 2021-07-22 NOTE — Telephone Encounter (Signed)
Requested medications are due for refill today.  unknown  Requested medications are on the active medications list.  no  Last refill. 06/10/2021  Future visit scheduled.   yes  Notes to clinic.  On active medication is 20 mg nebivolol. This rx is to refill '10mg'$ . Please advise.

## 2021-07-22 NOTE — Telephone Encounter (Signed)
Pt has an appt on 09/14/21

## 2021-07-27 LAB — HEMOGLOBIN A1C: Hemoglobin A1C: 8

## 2021-08-14 ENCOUNTER — Encounter (INDEPENDENT_AMBULATORY_CARE_PROVIDER_SITE_OTHER): Payer: BC Managed Care – PPO | Admitting: Ophthalmology

## 2021-08-14 ENCOUNTER — Other Ambulatory Visit: Payer: Self-pay

## 2021-08-14 DIAGNOSIS — E113313 Type 2 diabetes mellitus with moderate nonproliferative diabetic retinopathy with macular edema, bilateral: Secondary | ICD-10-CM

## 2021-08-14 DIAGNOSIS — I10 Essential (primary) hypertension: Secondary | ICD-10-CM

## 2021-08-14 DIAGNOSIS — H2513 Age-related nuclear cataract, bilateral: Secondary | ICD-10-CM

## 2021-08-14 DIAGNOSIS — H35033 Hypertensive retinopathy, bilateral: Secondary | ICD-10-CM

## 2021-08-14 DIAGNOSIS — H43813 Vitreous degeneration, bilateral: Secondary | ICD-10-CM

## 2021-08-23 ENCOUNTER — Other Ambulatory Visit: Payer: Self-pay | Admitting: Family Medicine

## 2021-08-23 DIAGNOSIS — E559 Vitamin D deficiency, unspecified: Secondary | ICD-10-CM

## 2021-08-23 NOTE — Telephone Encounter (Signed)
Requested medication (s) are due for refill today: yes  Requested medication (s) are on the active medication list: yes  Last refill:  03/09/21  Future visit scheduled: yes  Notes to clinic:  med not delegated to NT to RF   Requested Prescriptions  Pending Prescriptions Disp Refills   Vitamin D, Ergocalciferol, (DRISDOL) 1.25 MG (50000 UNIT) CAPS capsule [Pharmacy Med Name: VITAMIN D2 1.'25MG'$ (50,000 UNIT)] 12 capsule 1    Sig: Take 1 capsule (50,000 Units total) by mouth every 7 (seven) days.     Endocrinology:  Vitamins - Vitamin D Supplementation Failed - 08/23/2021 10:37 AM      Failed - 50,000 IU strengths are not delegated      Failed - Phosphate in normal range and within 360 days    No results found for: PHOS        Passed - Ca in normal range and within 360 days    Calcium  Date Value Ref Range Status  03/09/2021 9.8 8.6 - 10.3 mg/dL Final          Passed - Vitamin D in normal range and within 360 days    Vit D, 25-Hydroxy  Date Value Ref Range Status  03/09/2021 78 30 - 100 ng/mL Final    Comment:    Vitamin D Status         25-OH Vitamin D: . Deficiency:                    <20 ng/mL Insufficiency:             20 - 29 ng/mL Optimal:                 > or = 30 ng/mL . For 25-OH Vitamin D testing on patients on  D2-supplementation and patients for whom quantitation  of D2 and D3 fractions is required, the [employer](TM) 25-OH VIT D, (D2,D3), LC/MS/MS is recommended: order  code [phone] (patients >30yr). See Note 1 . Note 1 . 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

## 2021-08-24 ENCOUNTER — Encounter (INDEPENDENT_AMBULATORY_CARE_PROVIDER_SITE_OTHER): Payer: BC Managed Care – PPO | Admitting: Ophthalmology

## 2021-08-27 ENCOUNTER — Other Ambulatory Visit: Payer: Self-pay | Admitting: Family Medicine

## 2021-08-27 DIAGNOSIS — B351 Tinea unguium: Secondary | ICD-10-CM

## 2021-08-27 DIAGNOSIS — I10 Essential (primary) hypertension: Secondary | ICD-10-CM

## 2021-09-11 ENCOUNTER — Encounter (INDEPENDENT_AMBULATORY_CARE_PROVIDER_SITE_OTHER): Payer: BC Managed Care – PPO | Admitting: Ophthalmology

## 2021-09-14 ENCOUNTER — Telehealth (INDEPENDENT_AMBULATORY_CARE_PROVIDER_SITE_OTHER): Payer: BC Managed Care – PPO | Admitting: Family Medicine

## 2021-09-14 ENCOUNTER — Encounter: Payer: Self-pay | Admitting: Family Medicine

## 2021-09-14 VITALS — Ht 70.0 in | Wt 294.0 lb

## 2021-09-14 DIAGNOSIS — E559 Vitamin D deficiency, unspecified: Secondary | ICD-10-CM

## 2021-09-14 DIAGNOSIS — G4726 Circadian rhythm sleep disorder, shift work type: Secondary | ICD-10-CM

## 2021-09-14 DIAGNOSIS — G4733 Obstructive sleep apnea (adult) (pediatric): Secondary | ICD-10-CM

## 2021-09-14 DIAGNOSIS — K429 Umbilical hernia without obstruction or gangrene: Secondary | ICD-10-CM | POA: Diagnosis not present

## 2021-09-14 DIAGNOSIS — I10 Essential (primary) hypertension: Secondary | ICD-10-CM | POA: Diagnosis not present

## 2021-09-14 DIAGNOSIS — E11311 Type 2 diabetes mellitus with unspecified diabetic retinopathy with macular edema: Secondary | ICD-10-CM

## 2021-09-14 DIAGNOSIS — E669 Obesity, unspecified: Secondary | ICD-10-CM

## 2021-09-14 DIAGNOSIS — E1169 Type 2 diabetes mellitus with other specified complication: Secondary | ICD-10-CM

## 2021-09-14 DIAGNOSIS — I152 Hypertension secondary to endocrine disorders: Secondary | ICD-10-CM

## 2021-09-14 DIAGNOSIS — E1159 Type 2 diabetes mellitus with other circulatory complications: Secondary | ICD-10-CM

## 2021-09-14 MED ORDER — OLMESARTAN MEDOXOMIL-HCTZ 40-25 MG PO TABS: 1.0000 | ORAL_TABLET | Freq: Every day | ORAL | 0 refills | Status: DC

## 2021-09-14 MED ORDER — OLMESARTAN-AMLODIPINE-HCTZ 40-10-25 MG PO TABS: 1.0000 | ORAL_TABLET | Freq: Every day | ORAL | 1 refills | Status: DC

## 2021-09-14 MED ORDER — MODAFINIL 200 MG PO TABS: 200.0000 mg | ORAL_TABLET | Freq: Every day | ORAL | 0 refills | Status: DC

## 2021-09-14 NOTE — Progress Notes (Signed)
Name: Clarence Black   MRN: [id number]    DOB: 08/29/1966   Date:09/14/2021       Progress Note  Subjective  Chief Complaint  Chief Complaint  Patient presents with   Follow-up   Diabetes    [facility]    I connected with  [name]  on 09/14/21 at  2:20 PM EDT by a video enabled telemedicine application and verified that I am speaking with the correct person using two identifiers.  I discussed the limitations of evaluation and management by telemedicine and the availability of in person appointments. The patient expressed understanding and agreed to proceed with the virtual visit  Staff also discussed with the patient that there may be a patient responsible charge related to this service. Patient Location: at home  Provider Location: at home  Additional Individuals present: alone   HPI  HTN: he has been taking medications , Bystolic, Benicar/hctz, denies chest pain or palpitation. No dizziness. BP at home been [id number]   Umbilical hernia: he states it is getting larger and bothersome, not red or painful, but would like to see a surgeon now.   DMII: he sees Dr. Gabriel Carina, no recent episodes of hypoglycemia. He has retinopathy and sees Dr. West Pugh , last A1C was done 8 % ,he is on ARB, he also has dyslipidemia and is on statin therapy. He denies polyphagia, polydipsia or polyuria. LDL down to 53 and at goal, continue current medications. He is now on Ozempic 2 mg daily on his last visit.   Obesity: BMI above 40, he is a truck driver, reviewed importance of life style modification , he states he has been more physically active,trying to cut portion size. On higher dose of Ozempic and no side effects of medicaiton   Anxiety: he states usually takes Buspar prn  only, gets nervous before DOT and bp spikes otherwise doing well    OSA: he wears his CPAP most nights of the week, he skips at most once per week, he keeps the mask on as long as he sleeps He works from midnight until noon - 5  days a week, he is now taking modafinil but would like to increase dose from 100 mg to 200 mg since he is working from 11:45 pm until 12 pm , we will adjust and monitor   Vitamin D deficiency: taking rx weekly medication .   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) 08/06/2019   Vitamin D deficiency 01/29/2019   Sleep apnea 0000000   Umbilical hernia without obstruction and without gangrene 12/31/2016   Ventral hernia without obstruction or gangrene 12/31/2016   Low testosterone in male 10/22/2016   Hyperlipidemia LDL goal <70 06/13/2015   Hypertension goal BP (blood pressure) < 130/80 06/13/2015   ED (erectile dysfunction) of organic origin 02/20/2015   Obesity, Class III, BMI 40-49.9 (morbid obesity) ([place]) 02/20/2015    Past Surgical History:  Procedure Laterality Date   COLONOSCOPY  2013   COLONOSCOPY WITH PROPOFOL N/A 06/02/2018   Procedure: COLONOSCOPY WITH PROPOFOL;  Surgeon: Jonathon Bellows, MD;  Location: [facility] ENDOSCOPY;  Service: Gastroenterology;  Laterality: N/A;    Family History  Problem Relation Age of Onset   Diabetes Mother    Kidney disease Mother    Diabetes Brother    Diabetes Brother    Diabetes Maternal Grandfather    Hypertension Maternal Grandfather    Stroke Maternal Grandfather     Social History   Socioeconomic History   Marital status: Married    Spouse name: Jeanett Schlein    Number of children: 3   Years of  education: Not on file   Highest education level: Not on file  Occupational History   Occupation: driver   Tobacco Use   Smoking status: Never   Smokeless tobacco: Never  Vaping Use   Vaping Use: Never used  Substance and Sexual Activity   Alcohol use: No    Alcohol/week: 0.0 standard drinks   Drug use: No   Sexual activity: Yes    Partners: Female  Other Topics Concern   Not on file  Social History Narrative   Not on file   Social Determinants of Health   Financial Resource Strain: Low Risk    Difficulty of Paying Living Expenses: Not hard at all  Food Insecurity: No Food Insecurity   Worried About Charity fundraiser in the Last Year: Never true   [place] in the Last Year: Never true  Transportation Needs: No Transportation Needs   Lack of Transportation (Medical): No   Lack of Transportation (Non-Medical): No  Physical Activity: Insufficiently Active   Days of Exercise per Week: 2 days   Minutes of Exercise per Session: 40 min  Stress: No Stress Concern Present   Feeling of Stress : Not at all  Social Connections: Socially Integrated   Frequency of Communication with Friends and Family: More than three times a week   Frequency of Social Gatherings with Friends and Family: Twice a week   Attends Religious Services: More than 4 times per year   Active Member of [employer] or Organizations: Yes   Attends Music therapist: More than 4 times per year   Marital Status: Married  Human resources officer Violence: Not At Risk   Fear of Current or Ex-Partner: No   Emotionally Abused: No   Physically Abused: No   Sexually Abused: No     Current Outpatient Medications:    BESIVANCE 0.6 % SUSP, INSTILL ONE DROP INTO BOTH EYES 4 TIMES A DAY FOR 2 DAYS AFTER EACH MONTHLY EYE INJECTION, Disp: , Rfl:    brimonidine (ALPHAGAN) 0.15 % ophthalmic solution, SMARTSIG:In Eye(s), Disp: , Rfl:    brimonidine (ALPHAGAN) 0.15 % ophthalmic solution, Apply to eye., Disp: , Rfl:     busPIRone (BUSPAR) 5 MG tablet, TAKE 1 TABLET BY MOUTH TWICE A DAY AS NEEDED, Disp: 60 tablet, Rfl: 0   Dapagliflozin-metFORMIN HCl ER (XIGDUO XR) 04-999 MG TB24, Take 2 tablets by mouth daily., Disp: , Rfl:    glipiZIDE (GLUCOTROL XL) 10 MG 24 hr tablet, Take 1 tablet by mouth daily., Disp: , Rfl:    glucose blood (ONE TOUCH ULTRA TEST) test strip, Use as instructed, Disp: 300 each, Rfl: 3   Insulin Pen Needle (B-D UF III MINI PEN NEEDLES) 31G X 5 MM MISC, FOR USE WITH INJECTABLE MEDICINE, ONCE A DAY, Disp: 90 each, Rfl: 3   modafinil (PROVIGIL) 200 MG  tablet, Take 1 tablet (200 mg total) by mouth daily., Disp: 90 tablet, Rfl: 0   nebivolol 20 MG TABS, Take 1 tablet (20 mg total) by mouth daily. In place of metoprolol, Disp: 90 tablet, Rfl: 1   Olmesartan-amLODIPine-HCTZ 40-10-25 MG TABS, Take 1 tablet by mouth daily., Disp: 90 tablet, Rfl: 1   olmesartan-hydrochlorothiazide (BENICAR HCT) 40-25 MG tablet, Take 1 tablet by mouth daily., Disp: , Rfl:    ONETOUCH DELICA LANCETS FINE MISC, 1 Device by Does not apply route 3 (three) times daily., Disp: 300 each, Rfl: 3   OZEMPIC, 1 MG/DOSE, 4 MG/3ML SOPN, Inject 1 mg into the skin daily., Disp: , Rfl:    pioglitazone (ACTOS) 30 MG tablet, Take 30 mg by mouth daily., Disp: , Rfl:    rosuvastatin (CRESTOR) 40 MG tablet, Take 1 tablet (40 mg total) by mouth daily., Disp: 90 tablet, Rfl: 1   tadalafil (CIALIS) 20 MG tablet, Take 1 tablet (20 mg total) by mouth every three (3) days as needed for erectile dysfunction., Disp: 30 tablet, Rfl: 0   terbinafine (LAMISIL) 250 MG tablet, Take 1 tablet (250 mg total) by mouth daily., Disp: 90 tablet, Rfl: 0   Vitamin D, Ergocalciferol, (DRISDOL) 1.25 MG (50000 UNIT) CAPS capsule, TAKE 1 CAPSULE (50,000 UNITS TOTAL) BY MOUTH EVERY 7 (SEVEN) DAYS, Disp: 12 capsule, Rfl: 0  No Known Allergies  I personally reviewed active problem list, medication list, allergies, family history, social history, health maintenance  with the patient/caregiver today.   ROS  Ten systems reviewed and is negative except as mentioned in HPI   Objective  Virtual encounter, vitals not obtained.  Today's Vitals   09/14/21 1142  Weight: 294 lb (133.4 kg)  Height: '5\' 10"'$  (1.778 m)  PainSc: 0-No pain   Body mass index is 42.18 kg/m.   Physical Exam  Awake, alert and oriented   PHQ2/9: Depression screen [facility] 2/9 09/14/2021 06/10/2021 05/01/2021 03/09/2021 12/08/2020  Decreased Interest 0 0 0 0 0  Down, Depressed, Hopeless 0 0 0 0 0  PHQ - 2 Score 0 0 0 0 0  Altered sleeping - 0 - 1 -  Tired, decreased energy - 0 - 0 -  Change in appetite - 0 - 0 -  Feeling bad or failure about yourself  - 0 - 0 -  Trouble concentrating - 0 - 0 -  Moving slowly or fidgety/restless - 0 - 0 -  Suicidal thoughts - 0 - 0 -  PHQ-9 Score - 0 - 1 -  Difficult doing work/chores - - - Not difficult at all -  Some recent data might be hidden   PHQ-2/9 Result is negative.    Fall Risk: Fall Risk  09/14/2021 06/10/2021 05/01/2021 03/09/2021 12/08/2020  Falls in the past year? 0 0 0 0 0  Number falls in past yr: - 0 0 0 0  Injury with Fall? - 0 0 0 0     Assessment & Plan  1. Umbilical hernia without obstruction and without gangrene  - Ambulatory referral to General Surgery  2. Obesity, Class III, BMI 40-49.9 (morbid obesity) ([place])  Discussed with the patient the risk posed by an increased BMI. Discussed importance of portion control, calorie counting and at least 150 minutes of physical activity weekly. Avoid sweet beverages and drink more water. Eat at least 6 servings of fruit and vegetables daily    3. OSA (obstructive sleep apnea)  - modafinil (PROVIGIL) 200 MG tablet; Take 1 tablet (200  mg total) by mouth daily.  Dispense: 90 tablet; Refill: 0  4. Hypertension goal BP (blood pressure) < 130/80   5. Diabetes mellitus type 2 in obese ([place])   6. Hypertension associated with type 2 diabetes mellitus ([place])   7. Diabetic  retinopathy with macular edema associated with type 2 diabetes mellitus, unspecified laterality, unspecified retinopathy severity ([place])    8. Vitamin D deficiency   9. Shift work sleep disorder  - modafinil (PROVIGIL) 200 MG tablet; Take 1 tablet (200 mg total) by mouth daily.  Dispense: 90 tablet; Refill: 0   I discussed the assessment and treatment plan with the patient. The patient was provided an opportunity to ask questions and all were answered. The patient agreed with the plan and demonstrated an understanding of the instructions.  The patient was advised to call back or seek an in-person evaluation if the symptoms worsen or if the condition fails to improve as anticipated.  I provided 25 minutes of non-face-to-face time during this encounter.

## 2021-09-24 ENCOUNTER — Ambulatory Visit: Payer: BC Managed Care – PPO | Admitting: General Surgery

## 2021-09-29 ENCOUNTER — Other Ambulatory Visit: Payer: Self-pay

## 2021-09-29 ENCOUNTER — Ambulatory Visit (INDEPENDENT_AMBULATORY_CARE_PROVIDER_SITE_OTHER): Payer: BC Managed Care – PPO | Admitting: Surgery

## 2021-09-29 ENCOUNTER — Encounter: Payer: Self-pay | Admitting: Surgery

## 2021-09-29 VITALS — BP 145/91 | HR 75 | Temp 98.4°F | Ht 70.0 in | Wt 301.0 lb

## 2021-09-29 DIAGNOSIS — M6208 Separation of muscle (nontraumatic), other site: Secondary | ICD-10-CM | POA: Diagnosis not present

## 2021-09-29 MED ORDER — ABDOMINAL BINDER/ELASTIC 3XL MISC: 1.0000 [IU] | Freq: Every day | 2 refills | Status: DC

## 2021-09-29 NOTE — Progress Notes (Signed)
Patient ID: Clarence Black, male   DOB: Feb 24, 1966, 55 y.o.   MRN: [id number]  Chief Complaint: Previously told to have an umbilical/ventral hernia.  History of Present Illness Clarence Black is a 55 y.o. male with reports the umbilical/ventral hernia has been growing over time.  Mild pain and discomfort associated with stress and strain with lifting and pushing/pulling on his work at [employer] with moving dollies about.  He reports a bulging at the navel that extends cephalad.  He denies any history of nausea, vomiting, fevers and chills.  He reports regular bowel activity.  No other available abdominal wall imaging at this time.  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   Hyperlipidemia LDL goal <100 06/13/2015   Hypertension    Hypertension goal BP (blood pressure) < 130/80 06/13/2015   Hypogonadism, male    Obesity, Class III, BMI 40-49.9 (morbid obesity) ([place]) 02/20/2015   Sleep apnea 09/29/2018   Type 2 diabetes mellitus ([place]) 10/01/2015   Uncontrolled type 2 diabetes mellitus with peripheral circulatory disorder 06/13/2015      Past Surgical History:  Procedure Laterality Date   COLONOSCOPY  2013   COLONOSCOPY WITH PROPOFOL N/A 06/02/2018   Procedure: COLONOSCOPY WITH PROPOFOL;  Surgeon: Jonathon Bellows, MD;  Location: [facility] ENDOSCOPY;  Service: Gastroenterology;  Laterality: N/A;    No Known Allergies  Current Outpatient Medications  Medication Sig Dispense Refill   BESIVANCE 0.6 % SUSP INSTILL ONE DROP INTO BOTH EYES 4 TIMES A DAY FOR 2 DAYS AFTER EACH MONTHLY EYE INJECTION     brimonidine (ALPHAGAN) 0.15 % ophthalmic solution Apply to eye.     busPIRone (BUSPAR) 5 MG tablet TAKE 1 TABLET BY MOUTH TWICE A DAY AS NEEDED 60 tablet 0   Dapagliflozin-metFORMIN HCl ER (XIGDUO XR) 04-999 MG  TB24 Take 2 tablets by mouth daily.     glipiZIDE (GLUCOTROL XL) 10 MG 24 hr tablet Take 1 tablet by mouth daily.     glucose blood (ONE TOUCH ULTRA TEST) test strip Use as instructed 300 each 3   Insulin Pen Needle (B-D UF III MINI PEN NEEDLES) 31G X 5 MM MISC FOR USE WITH INJECTABLE MEDICINE, ONCE A DAY 90 each 3   modafinil (PROVIGIL) 200 MG tablet Take 1 tablet (200 mg total) by mouth daily. 90 tablet 0   nebivolol 20 MG TABS Take 1 tablet (20 mg total) by mouth daily. In place of metoprolol 90 tablet 1   olmesartan-hydrochlorothiazide (BENICAR HCT) 40-25 MG tablet Take 1 tablet by mouth daily. 90 tablet 0   ONETOUCH DELICA LANCETS FINE MISC 1 Device by Does not apply route 3 (three) times daily. 300 each 3   rosuvastatin (CRESTOR) 40 MG tablet Take 1 tablet (40 mg total) by mouth daily. 90 tablet 1   Semaglutide, 2 MG/DOSE, 8 MG/3ML SOPN Inject 2 mg into the skin once a week.     tadalafil (CIALIS) 20 MG tablet Take 1 tablet (20 mg total) by mouth every three (3) days as needed for erectile dysfunction. 30 tablet 0   Vitamin D, Ergocalciferol, (DRISDOL) 1.25 MG (50000 UNIT) CAPS capsule TAKE 1 CAPSULE (50,000 UNITS TOTAL) BY MOUTH EVERY 7 (SEVEN) DAYS 12 capsule 0   No current facility-administered medications for this visit.    Family History Family History  Problem Relation Age of Onset   Diabetes Mother    Kidney disease Mother    Diabetes Brother    Diabetes Brother    Diabetes Maternal Grandfather    Hypertension Maternal Grandfather    Stroke Maternal Grandfather       Social History Social History   Tobacco Use   Smoking status: Never   Smokeless tobacco: Never  Vaping Use   Vaping Use: Never used  Substance Use Topics   Alcohol use: No    Alcohol/week: 0.0 standard drinks   Drug use: No        Review of Systems  Constitutional: Negative.   HENT: Negative.    Eyes: Negative.   Respiratory: Negative.    Cardiovascular: Negative.   Gastrointestinal:  Negative.   Genitourinary: Negative.   Skin: Negative.   Neurological: Negative.   Psychiatric/Behavioral: Negative.       Physical Exam Blood pressure (!) 145/91, pulse 75, temperature 98.4 F (36.9 C), temperature source Oral, height 5\' 10"  (1.778 m), weight (!) 301 lb (136.5 kg), SpO2 97 %. Last Weight  Most recent update: 09/29/2021  1:48 PM    Weight  136.5 kg (301 lb)               CONSTITUTIONAL: Well developed, and nourished, appropriately responsive and aware without distress.   EYES: Sclera non-icteric.   EARS, NOSE, MOUTH AND THROAT: Mask worn.   Hearing is intact to voice.  NECK: Trachea is midline, and there is no jugular venous distension.  LYMPH NODES:  Lymph nodes in the neck are not enlarged. RESPIRATORY:  Lungs are clear, and breath sounds are equal bilaterally. Normal respiratory effort without pathologic use of accessory muscles. CARDIOVASCULAR: Heart is regular in rate and rhythm. GI: The abdomen is remarkable for a wide-based diastases, and a flattened umbilicus.  I do not appreciate any fascial defect.  Otherwise soft, nontender, and nondistended. There were no palpable masses. I did not appreciate hepatosplenomegaly. MUSCULOSKELETAL:  Symmetrical muscle tone appreciated in all four extremities.    SKIN: Skin turgor is normal. No pathologic skin lesions appreciated.  NEUROLOGIC:  Motor and sensation appear grossly normal.  Cranial nerves are grossly without defect. PSYCH:  Alert and oriented to person, place and time. Affect is appropriate for situation.  Data Reviewed I have personally reviewed what is currently available of the patient's imaging, recent labs and medical records.   Labs:  CBC Latest Ref Rng & Units 06/23/2018 09/23/2017 10/22/2016  WBC 3.8 - 10.8 Thousand/uL 7.7 8.3 7.2  Hemoglobin 13.2 - 17.1 g/dL 15.7 15.7 15.1  Hematocrit 38.5 - 50.0 % 46.5 46.6 44.3  Platelets 140 - 400 Thousand/uL 176 189 156   CMP Latest Ref Rng & Units 03/09/2021  02/06/2020 01/29/2019  Glucose 65 - 99 mg/dL 122(H) 94 143(H)  BUN 7 - 25 mg/dL 17 16 14   Creatinine 0.70 - 1.33 mg/dL 1.04 1.06 1.09  Sodium 135 - 146 mmol/L 141 138 139  Potassium 3.5 - 5.3 mmol/L 4.3 3.8 3.8  Chloride 98 - 110 mmol/L 103 104 100  CO2 20 - 32 mmol/L 29 27 29   Calcium 8.6 - 10.3 mg/dL 9.8 9.5 9.5  Total Protein 6.1 - 8.1 g/dL 7.0 7.0 6.9  Total Bilirubin 0.2 - 1.2 mg/dL 0.6 1.0 1.0  Alkaline Phos 38 - 126 U/L - - -  AST 10 - 35 U/L 19 21 23   ALT 9 - 46 U/L 16 14 17       Imaging:  Within last 24 hrs: No results found.  Assessment    Diastases recti, without umbilical hernia present. 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  I believe there are a number of options out there to assist him.  We discussed the difference between a diastases and hernia.  We discussed the degree of disability associated with a widening of the linea alba.  There may be options of adding abdominal support as well. There is no role for surgical intervention, though there is some movements toward aggressive repairs of the abdominal wall to bring about a complete  muscular tube.  At present I believe the risk of these procedures is worse than the benefit. Pursuing weight loss with lifestyle changes was also discussed. Face-to-face time spent with the patient and accompanying care providers(if present) was 30 minutes, with more than 50% of the time spent counseling, educating, and coordinating care of the patient.    These notes generated with voice recognition software. I apologize for typographical errors.  Ronny Bacon M.D., FACS 09/29/2021, 2:14 PM

## 2021-09-29 NOTE — Patient Instructions (Addendum)
Here is a website for excises to strengthen the abdominal muscles:  [email or web address].[email or web address]   If you have any concerns or questions, please feel free to call our office. Follow up as needed.   Diastasis Recti Diastasis recti is a condition in which the muscles of the abdomen (rectus abdominis muscles) become thin and separate. The result is a wider space between the muscles of the right and left abdomen (abdominal muscles). This wider space between the muscles may cause a bulge in the middle of the abdomen. This bulge may be noticed when a person is straining or when he or she sits up after lying down. Diastasis recti can affect men and women. It is most common among pregnant women, babies, people with obesity, and people who have had abdominal surgery. Exercise or surgery may help correct this condition. What are the causes? Common causes of this condition include: Pregnancy. As the uterus grows in size, it puts pressure on the abdominal muscles, causing the muscles to separate. Obesity. Excess fat puts pressure on abdominal muscles. Weight lifting. Some exercises of the abdomen. Advanced age. Genetics. Having had surgery on the abdomen before. What increases the risk? This condition is more likely to develop in: Women. Newborns, especially newborns who are born early (prematurely). What are the signs or symptoms? Common symptoms of this condition include: A bulge in the middle of your abdomen. You will notice it most when you sit up or strain. Pain in your low back, hips, or the area between your hip bones (pelvis). Constipation. Being unable to control when you urinate (urinary incontinence). Bloating. Poor posture. How is this diagnosed? This condition is diagnosed with a physical exam. During the exam, your health care provider will ask you to lie flat on your back and do a crunch or half sit-up. If you have  diastasis recti, a bulge will appear lengthwise between your abdominal muscles in the center of your abdomen. Your health care provider will measure the gap between your muscles with one of the following: A medical device used to measure the space between two objects (caliper). A tape measure. CT scan. Ultrasound. Finger spaces. Your health care provider will measure the space using his or her fingers. How is this treated? If your muscle separation is not too large, you may not need treatment. However, if you are a woman who plans to become pregnant again, you should treat this condition before your next pregnancy. Treatment may include: Physical therapy exercises to strengthen and tighten your abdominal muscles. Lifestyle changes such as weight loss and exercise. Over-the-counter pain medicines as needed. Surgery to correct the separation. Follow these instructions at home: Activity Return to your normal activities as told by your health care provider. Ask your health care provider what activities are safe for you. Do exercises as told by your health care provider. Make sure you are doing your exercises and movements correctly when lifting weights or doing exercises using your abdominal muscles or the muscles in the center of your body that give stability (core muscles). Proper form can help to prevent this condition from happening again. General instructions If you are overweight, ask your health care provider for help with weight loss. Losing even a small amount of weight can help to improve your diastasis recti. Take over-the-counter or prescription medicines only as told by your health care provider. Do not strain. Straining can make the separation worse. Examples of straining include: Pushing hard to have a bowel movement, such as when you have  constipation. Lifting heavy objects or lifting children. Standing up and sitting down. You may need to take these actions to prevent or treat  constipation: Drink enough fluid to keep your urine pale yellow. Take over-the-counter or prescription medicines. Eat foods that are high in fiber, such as beans, whole grains, and fresh fruits and vegetables. Limit foods that are high in fat and processed sugars, such as fried or sweet foods. Keep all follow-up visits. This is important. Contact a health care provider if: You notice a new bulge in your abdomen. Get help right away if: You experience severe discomfort in your abdomen. You develop severe abdominal pain along with nausea, vomiting, or a fever. Summary Diastasis recti is a condition in which the muscles of the abdomen (rectus abdominismuscles) become thin and separate. You may notice a bulge in your abdomen because the space has widened between the muscles of the right and left abdomen. The most common symptom is a bulge in the middle of your abdomen. You will notice it most when you sit up or strain. This condition is diagnosed with a physical exam. If the muscle separation is not too big, you may not need treatment. Otherwise, you may need to do physical therapy or have surgery. This information is not intended to replace advice given to you by your health care provider. Make sure you discuss any questions you have with your health care provider. Document Revised: 08/15/2020 Document Reviewed: 08/15/2020 Elsevier Patient Education  [place].

## 2021-09-30 ENCOUNTER — Telehealth: Payer: Self-pay | Admitting: Surgery

## 2021-09-30 NOTE — Telephone Encounter (Signed)
Spoke with [facility]-abdominal binder is non insurable item- call patient to let him know they can fit him for one but it would not be filled under insurance.

## 2021-09-30 NOTE — Telephone Encounter (Signed)
Patient requesting an abdominal binder- he was instructed to go to [facility] and be fitted and if we need to send order we can-patient will call back to let us know what size and we can fax the order.

## 2021-09-30 NOTE — Telephone Encounter (Signed)
Patient is calling said Dr. Christian Mate had mention a brace he could try patient is asking if  he can get an rx for the brace. Please call patient and advise.

## 2021-10-02 ENCOUNTER — Encounter (INDEPENDENT_AMBULATORY_CARE_PROVIDER_SITE_OTHER): Payer: BC Managed Care – PPO | Admitting: Ophthalmology

## 2021-10-02 ENCOUNTER — Other Ambulatory Visit: Payer: Self-pay

## 2021-10-02 DIAGNOSIS — H43813 Vitreous degeneration, bilateral: Secondary | ICD-10-CM | POA: Diagnosis not present

## 2021-10-02 DIAGNOSIS — I10 Essential (primary) hypertension: Secondary | ICD-10-CM | POA: Diagnosis not present

## 2021-10-02 DIAGNOSIS — H35033 Hypertensive retinopathy, bilateral: Secondary | ICD-10-CM

## 2021-10-02 DIAGNOSIS — E113393 Type 2 diabetes mellitus with moderate nonproliferative diabetic retinopathy without macular edema, bilateral: Secondary | ICD-10-CM | POA: Diagnosis not present

## 2021-10-30 LAB — HEMOGLOBIN A1C: Hemoglobin A1C: 8.1

## 2021-10-31 ENCOUNTER — Other Ambulatory Visit: Payer: Self-pay | Admitting: Family Medicine

## 2021-10-31 DIAGNOSIS — I10 Essential (primary) hypertension: Secondary | ICD-10-CM

## 2021-10-31 NOTE — Telephone Encounter (Signed)
Last RF 06/10/21 #90 1 RF

## 2021-11-13 ENCOUNTER — Encounter (INDEPENDENT_AMBULATORY_CARE_PROVIDER_SITE_OTHER): Payer: BC Managed Care – PPO | Admitting: Ophthalmology

## 2021-11-13 ENCOUNTER — Other Ambulatory Visit: Payer: Self-pay

## 2021-11-13 DIAGNOSIS — H43813 Vitreous degeneration, bilateral: Secondary | ICD-10-CM

## 2021-11-13 DIAGNOSIS — E113312 Type 2 diabetes mellitus with moderate nonproliferative diabetic retinopathy with macular edema, left eye: Secondary | ICD-10-CM | POA: Diagnosis not present

## 2021-11-13 DIAGNOSIS — E113391 Type 2 diabetes mellitus with moderate nonproliferative diabetic retinopathy without macular edema, right eye: Secondary | ICD-10-CM | POA: Diagnosis not present

## 2021-11-13 DIAGNOSIS — I10 Essential (primary) hypertension: Secondary | ICD-10-CM | POA: Diagnosis not present

## 2021-11-13 DIAGNOSIS — H35033 Hypertensive retinopathy, bilateral: Secondary | ICD-10-CM

## 2021-12-11 NOTE — Progress Notes (Signed)
Name: Clarence Black   MRN: [id number]    DOB: 1966/01/30   Date:12/14/2021       Progress Note  Subjective  Chief Complaint  Follow Up  HPI  HTN: he has been taking medications , Bystolic, Benicar/hctz, denies chest pain or palpitation. No dizziness. BP has been under control lately   Umbilical hernia: he saw surgeon and was advised to lose weight first   DMII: he sees Dr. Gabriel Carina, no recent episodes of hypoglycemia. He has retinopathy and sees Dr. West Pugh , last A1C was done 8 % and stable, ,he is on ARB, he also has dyslipidemia and is on statin therapy. He denies polyphagia, polydipsia or polyuria. LDL down to 53 and at goal, continue current medications. Recently switched from [place] to [facility] but has not started medication yet   Obesity: BMI above 40, he is a truck driver, reviewed importance of life style modification , he will start Mounjarno soon and will hopefully control his appetite   Anxiety: he states usually takes Buspar prn  only, gets nervous before DOT and bp spikes otherwise doing well Unchanged    OSA: he wears his CPAP most nights of the week, however unable to wear it over the past two weeks due to an URI and still having some nasal congestion and clear rhinorrhea. Modafinil helps him stay awake. 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   Vitamin D deficiency: he finished rx vitami D , advised to take 2000 units otc from now on    Dyslipidemia: we adjusted from Atorvastatin to Rosuvastatin 40 , LDL went from 72 to 53, continue medication . 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 ([place]) 08/06/2019   Vitamin D deficiency 01/29/2019   Sleep apnea 09/29/2018   Diastasis recti 12/31/2016   Low testosterone in male 10/22/2016   Hyperlipidemia LDL goal <70 06/13/2015   Hypertension goal BP (blood pressure) < 130/80 06/13/2015   ED (erectile dysfunction) of organic origin 02/20/2015   Obesity, Class III, BMI 40-49.9 (morbid obesity) ([place]) 02/20/2015    Past Surgical History:  Procedure Laterality Date   COLONOSCOPY  2013   COLONOSCOPY WITH PROPOFOL N/A 06/02/2018   Procedure: COLONOSCOPY WITH PROPOFOL;  Surgeon: Jonathon Bellows, MD;  Location: [facility] ENDOSCOPY;  Service: Gastroenterology;  Laterality: N/A;    Family History  Problem Relation Age of Onset   Diabetes Mother    Kidney disease Mother    Diabetes Brother    Diabetes Brother    Diabetes Maternal Grandfather    Hypertension Maternal Grandfather    Stroke Maternal Grandfather     Social History   Tobacco Use   Smoking status: Never   Smokeless tobacco: Never  Substance Use Topics   Alcohol use: No    Alcohol/week: 0.0 standard drinks     Current Outpatient Medications:    BESIVANCE 0.6 % SUSP, INSTILL ONE DROP INTO BOTH EYES 4 TIMES A  DAY FOR 2 DAYS AFTER EACH MONTHLY EYE INJECTION, Disp: , Rfl:    brimonidine (ALPHAGAN) 0.15 % ophthalmic solution, Apply to eye., Disp: , Rfl:    busPIRone (BUSPAR) 5 MG tablet, TAKE 1 TABLET BY MOUTH TWICE A DAY AS NEEDED, Disp: 60 tablet, Rfl: 0   Dapagliflozin-metFORMIN HCl ER (XIGDUO XR) 04-999 MG TB24, Take 2 tablets by mouth daily., Disp: , Rfl:    Elastic Bandages & Supports (ABDOMINAL BINDER/ELASTIC 3XL) MISC, 1 Units by Does not apply route daily., Disp: 2 each, Rfl: 2   glipiZIDE (GLUCOTROL XL) 10 MG 24 hr tablet, Take 1 tablet by mouth daily., Disp: , Rfl:    glucose blood (ONE TOUCH  ULTRA TEST) test strip, Use as instructed, Disp: 300 each, Rfl: 3   Insulin Pen Needle (B-D UF III MINI PEN NEEDLES) 31G X 5 MM MISC, FOR USE WITH INJECTABLE MEDICINE, ONCE A DAY, Disp: 90 each, Rfl: 3   modafinil (PROVIGIL) 200 MG tablet, Take 1 tablet (200 mg total) by mouth daily., Disp: 90 tablet, Rfl: 0   nebivolol 20 MG TABS, Take 1 tablet (20 mg total) by mouth daily. In place of metoprolol, Disp: 90 tablet, Rfl: 1   olmesartan-hydrochlorothiazide (BENICAR HCT) 40-25 MG tablet, Take 1 tablet by mouth daily., Disp: 90 tablet, Rfl: 0   ONETOUCH DELICA LANCETS FINE MISC, 1 Device by Does not apply route 3 (three) times daily., Disp: 300 each, Rfl: 3   rosuvastatin (CRESTOR) 40 MG tablet, Take 1 tablet (40 mg total) by mouth daily., Disp: 90 tablet, Rfl: 1   Semaglutide, 2 MG/DOSE, 8 MG/3ML SOPN, Inject 2 mg into the skin once a week., Disp: , Rfl:    tadalafil (CIALIS) 20 MG tablet, Take 1 tablet (20 mg total) by mouth every three (3) days as needed for erectile dysfunction., Disp: 30 tablet, Rfl: 0   Vitamin D, Ergocalciferol, (DRISDOL) 1.25 MG (50000 UNIT) CAPS capsule, TAKE 1 CAPSULE (50,000 UNITS TOTAL) BY MOUTH EVERY 7 (SEVEN) DAYS (Patient not taking: Reported on 12/14/2021), Disp: 12 capsule, Rfl: 0  No Known Allergies  I personally reviewed active problem list, medication list, allergies, family history, social history, health maintenance with the patient/caregiver today.   ROS  Ten systems reviewed and is negative except as mentioned in HPI   Objective  Vitals:   12/14/21 1514  BP: 134/86  Pulse: 82  Resp: 16  Temp: 98.1 F (36.7 C)  SpO2: 97%  Weight: 299 lb (135.6 kg)  Height: 5\' 10"  (1.778 m)    Body mass index is 42.9 kg/m.  Physical Exam  Constitutional: Patient appears well-developed and well-nourished. Obese  No distress.  HEENT: head atraumatic, normocephalic, pupils equal and reactive to light,  neck supple Cardiovascular: Normal rate, regular rhythm and  normal heart sounds.  No murmur heard. No BLE edema. Pulmonary/Chest: Effort normal and breath sounds normal. No respiratory distress. Abdominal: Soft.  There is no tenderness. Skin: soft mass, rolls under skin on nuchal area, no redness or increase in warmth, present for a while. Explained to patient likely lipoma but if changes to let us know  Psychiatric: Patient has a normal mood and affect. behavior is normal. Judgment and thought content normal.   Recent Results (from the past 2160 hour(s))  Hemoglobin A1c     Status: None   Collection Time: 10/30/21 12:00 AM  Result Value Ref Range   Hemoglobin A1C 8.1      PHQ2/9: Depression screen [facility] 2/9 12/14/2021 09/14/2021 06/10/2021 05/01/2021  03/09/2021  Decreased Interest 0 0 0 0 0  Down, Depressed, Hopeless 0 0 0 0 0  PHQ - 2 Score 0 0 0 0 0  Altered sleeping 0 - 0 - 1  Tired, decreased energy 0 - 0 - 0  Change in appetite 0 - 0 - 0  Feeling bad or failure about yourself  0 - 0 - 0  Trouble concentrating 0 - 0 - 0  Moving slowly or fidgety/restless 0 - 0 - 0  Suicidal thoughts 0 - 0 - 0  PHQ-9 Score 0 - 0 - 1  Difficult doing work/chores - - - - Not difficult at all  Some recent data might be hidden    phq 9 is negative   Fall Risk: Fall Risk  12/14/2021 09/14/2021 06/10/2021 05/01/2021 03/09/2021  Falls in the past year? 0 0 0 0 0  Number falls in past yr: 0 - 0 0 0  Injury with Fall? 0 - 0 0 0  Risk for fall due to : No Fall Risks - - - -  Follow up Falls prevention discussed - - - -      Functional Status Survey: Is the patient deaf or have difficulty hearing?: No Does the patient have difficulty seeing, even when wearing glasses/contacts?: No Does the patient have difficulty concentrating, remembering, or making decisions?: No Does the patient have difficulty walking or climbing stairs?: No Does the patient have difficulty dressing or bathing?: No Does the patient have difficulty doing errands alone such as visiting a  doctor's office or shopping?: No    Assessment & Plan  1. OSA (obstructive sleep apnea)  - modafinil (PROVIGIL) 200 MG tablet; Take 1 tablet (200 mg total) by mouth daily.  Dispense: 90 tablet; Refill: 0  2. Shift work sleep disorder  - modafinil (PROVIGIL) 200 MG tablet; Take 1 tablet (200 mg total) by mouth daily.  Dispense: 90 tablet; Refill: 0  3. Other male erectile dysfunction  - tadalafil (CIALIS) 20 MG tablet; Take 1 tablet (20 mg total) by mouth every three (3) days as needed for erectile dysfunction.  Dispense: 30 tablet; Refill: 0  4. Hyperlipidemia LDL goal <70  - rosuvastatin (CRESTOR) 40 MG tablet; Take 1 tablet (40 mg total) by mouth daily.  Dispense: 90 tablet; Refill: 1  5. Hypertension goal BP (blood pressure) < 130/80  - olmesartan-hydrochlorothiazide (BENICAR HCT) 40-25 MG tablet; Take 1 tablet by mouth daily.  Dispense: 90 tablet; Refill: 1 - Nebivolol HCl 20 MG TABS; Take 1 tablet (20 mg total) by mouth daily. In place of metoprolol  Dispense: 90 tablet; Refill: 1 - hydrALAZINE (APRESOLINE) 10 MG tablet; Take 1 tablet (10 mg total) by mouth daily as needed. Bp above 145/90  Dispense: 90 tablet; Refill: 0  6. Need for shingles vaccine  - Varicella-zoster vaccine IM  7. Vitamin D deficiency  - Cholecalciferol (VITAMIN D3) 50 MCG (2000 UT) capsule; Take 1 capsule (2,000 Units total) by mouth daily.  Dispense: 100 capsule; Refill: 1  8. Dyslipidemia due to type 2 diabetes mellitus ([place])   9. Rhinorrhea  - azelastine (ASTELIN) 0.1 % nasal spray; Place 2 sprays into both nostrils 2 (two) times daily. Use in each nostril as directed  Dispense: 30 mL; Refill: 1

## 2021-12-14 ENCOUNTER — Encounter: Payer: Self-pay | Admitting: Family Medicine

## 2021-12-14 ENCOUNTER — Ambulatory Visit (INDEPENDENT_AMBULATORY_CARE_PROVIDER_SITE_OTHER): Payer: BC Managed Care – PPO | Admitting: Family Medicine

## 2021-12-14 VITALS — BP 134/86 | HR 82 | Temp 98.1°F | Resp 16 | Ht 70.0 in | Wt 299.0 lb

## 2021-12-14 DIAGNOSIS — Z23 Encounter for immunization: Secondary | ICD-10-CM

## 2021-12-14 DIAGNOSIS — E559 Vitamin D deficiency, unspecified: Secondary | ICD-10-CM

## 2021-12-14 DIAGNOSIS — E1169 Type 2 diabetes mellitus with other specified complication: Secondary | ICD-10-CM | POA: Diagnosis not present

## 2021-12-14 DIAGNOSIS — J3489 Other specified disorders of nose and nasal sinuses: Secondary | ICD-10-CM

## 2021-12-14 DIAGNOSIS — G4726 Circadian rhythm sleep disorder, shift work type: Secondary | ICD-10-CM | POA: Diagnosis not present

## 2021-12-14 DIAGNOSIS — I10 Essential (primary) hypertension: Secondary | ICD-10-CM

## 2021-12-14 DIAGNOSIS — G4733 Obstructive sleep apnea (adult) (pediatric): Secondary | ICD-10-CM | POA: Diagnosis not present

## 2021-12-14 DIAGNOSIS — N528 Other male erectile dysfunction: Secondary | ICD-10-CM

## 2021-12-14 DIAGNOSIS — E785 Hyperlipidemia, unspecified: Secondary | ICD-10-CM

## 2021-12-14 MED ORDER — NEBIVOLOL HCL 20 MG PO TABS: 20.0000 mg | ORAL_TABLET | Freq: Every day | ORAL | 1 refills | Status: DC

## 2021-12-14 MED ORDER — TADALAFIL 20 MG PO TABS: 20.0000 mg | ORAL_TABLET | ORAL | 0 refills | Status: DC | PRN

## 2021-12-14 MED ORDER — VITAMIN D3 50 MCG (2000 UT) PO CAPS: 2000.0000 [IU] | ORAL_CAPSULE | Freq: Every day | ORAL | 1 refills | Status: DC

## 2021-12-14 MED ORDER — MODAFINIL 200 MG PO TABS: 200.0000 mg | ORAL_TABLET | Freq: Every day | ORAL | 0 refills | Status: DC

## 2021-12-14 MED ORDER — OLMESARTAN MEDOXOMIL-HCTZ 40-25 MG PO TABS: 1.0000 | ORAL_TABLET | Freq: Every day | ORAL | 1 refills | Status: DC

## 2021-12-14 MED ORDER — ROSUVASTATIN CALCIUM 40 MG PO TABS
40.0000 mg | ORAL_TABLET | Freq: Every day | ORAL | 1 refills | Status: DC
Start: 2021-12-14 — End: 2022-05-04

## 2021-12-14 MED ORDER — HYDRALAZINE HCL 10 MG PO TABS: 10.0000 mg | ORAL_TABLET | Freq: Every day | ORAL | 0 refills | Status: DC | PRN

## 2021-12-14 MED ORDER — AZELASTINE HCL 0.1 % NA SOLN: 2.0000 | Freq: Two times a day (BID) | NASAL | 1 refills | Status: DC

## 2022-01-01 ENCOUNTER — Other Ambulatory Visit: Payer: Self-pay

## 2022-01-01 ENCOUNTER — Encounter (INDEPENDENT_AMBULATORY_CARE_PROVIDER_SITE_OTHER): Payer: BC Managed Care – PPO | Admitting: Ophthalmology

## 2022-01-01 DIAGNOSIS — E113391 Type 2 diabetes mellitus with moderate nonproliferative diabetic retinopathy without macular edema, right eye: Secondary | ICD-10-CM | POA: Diagnosis not present

## 2022-01-01 DIAGNOSIS — H35033 Hypertensive retinopathy, bilateral: Secondary | ICD-10-CM | POA: Diagnosis not present

## 2022-01-01 DIAGNOSIS — E113312 Type 2 diabetes mellitus with moderate nonproliferative diabetic retinopathy with macular edema, left eye: Secondary | ICD-10-CM

## 2022-01-01 DIAGNOSIS — H43813 Vitreous degeneration, bilateral: Secondary | ICD-10-CM

## 2022-01-01 DIAGNOSIS — I10 Essential (primary) hypertension: Secondary | ICD-10-CM

## 2022-01-06 ENCOUNTER — Other Ambulatory Visit: Payer: Self-pay | Admitting: Family Medicine

## 2022-01-06 DIAGNOSIS — J3489 Other specified disorders of nose and nasal sinuses: Secondary | ICD-10-CM

## 2022-01-06 NOTE — Telephone Encounter (Signed)
Requested Prescriptions  Pending Prescriptions Disp Refills   Azelastine HCl 137 MCG/SPRAY SOLN [Pharmacy Med Name: AZELASTINE 0.1% (137 MCG) SPRY]  1    Sig: PLACE 2 SPRAYS INTO BOTH NOSTRILS 2 (TWO) TIMES DAILY. USE IN EACH NOSTRIL AS DIRECTED     Ear, Nose, and Throat: Nasal Preparations - Antiallergy Passed - 01/06/2022 12:31 PM      Passed - Valid encounter within last 12 months    Recent Outpatient Visits          3 weeks ago Dyslipidemia due to type 2 diabetes mellitus [facility])   [facility], Drue Stager, MD   3 months ago Umbilical hernia without obstruction and without gangrene   [facility], Drue Stager, MD   7 months ago OSA (obstructive sleep apnea)   [facility] Steele Sizer, MD   8 months ago Well adult exam   [facility] Steele Sizer, MD   10 months ago 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

## 2022-02-05 LAB — HEMOGLOBIN A1C: Hemoglobin A1C: 8.8

## 2022-02-19 ENCOUNTER — Other Ambulatory Visit: Payer: Self-pay

## 2022-02-19 ENCOUNTER — Encounter (INDEPENDENT_AMBULATORY_CARE_PROVIDER_SITE_OTHER): Payer: BC Managed Care – PPO | Admitting: Ophthalmology

## 2022-02-19 DIAGNOSIS — E113391 Type 2 diabetes mellitus with moderate nonproliferative diabetic retinopathy without macular edema, right eye: Secondary | ICD-10-CM

## 2022-02-19 DIAGNOSIS — H35033 Hypertensive retinopathy, bilateral: Secondary | ICD-10-CM

## 2022-02-19 DIAGNOSIS — E113312 Type 2 diabetes mellitus with moderate nonproliferative diabetic retinopathy with macular edema, left eye: Secondary | ICD-10-CM

## 2022-02-19 DIAGNOSIS — I10 Essential (primary) hypertension: Secondary | ICD-10-CM | POA: Diagnosis not present

## 2022-02-19 DIAGNOSIS — H43813 Vitreous degeneration, bilateral: Secondary | ICD-10-CM

## 2022-02-19 DIAGNOSIS — H2513 Age-related nuclear cataract, bilateral: Secondary | ICD-10-CM

## 2022-03-12 NOTE — Progress Notes (Deleted)
Name: Clarence Black   MRN: [id number]    DOB: April 02, 1966   Date:03/12/2022 ? ?     Progress Note ? ?Subjective ? ?Chief Complaint ? ?Follow Up ? ?HPI ? ?HTN: he has been taking medications , Bystolic, Benicar/hctz, denies chest pain or palpitation. No dizziness. BP has been under control lately  ? ?Umbilical hernia: he saw surgeon and was advised to lose weight first  ? ?DMII: he sees Dr. Gabriel Carina, no recent episodes of hypoglycemia. He has retinopathy and sees Dr. West Pugh , last A1C was done 8 % and stable, ,he is on ARB, he also has dyslipidemia and is on statin therapy. He denies polyphagia, polydipsia or polyuria. LDL down to 53 and at goal, continue current medications. Recently switched from [place] to [facility] but has not started medication yet  ? 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  ?OSA: he wears his CPAP most nights of the week, however unable to wear it over the past two weeks due to an URI and still having some nasal congestion and clear rhinorrhea. Modafinil helps him stay awake. 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  ? ?Vitamin D deficiency: he finished rx vitami D , advised to take 2000 units otc from now on   ? ?Dyslipidemia: we adjusted from Atorvastatin to Rosuvastatin 40 , LDL went from 72 to 53, continue medication . Discussed ways to improve good cholesterol  ? ?Rhinorrhea: going on for a couple of weeks, he took some otc diabetic medication and flonase , we will try astelin and nasal saline  ? ?Patient Active Problem List  ? Diagnosis Date Noted  ? Hypertension associated with type 2 diabetes mellitus ([place]) 06/10/2021  ? Dyslipidemia due to type 2 diabetes mellitus ([place]) 06/10/2021  ? Diabetes mellitus type  2 in obese ([place]) 06/10/2021  ? BPH with obstruction/lower urinary tract symptoms 04/29/2020  ? 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 Vitamin D deficiency 01/29/2019  ? Sleep apnea 09/29/2018  ? Diastasis recti 12/31/2016  ? 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 Hyperlipidemia LDL goal <70 06/13/2015  ? Hypertension goal BP (blood pressure) < 130/80 06/13/2015  ? ED (erectile dysfunction) of organic origin 02/20/2015  ? Obesity, Class III, BMI 40-49.9 (morbid obesity) ([place]) 02/20/2015  ? ? ?Past Surgical History:  ?Procedure Laterality Date  ? COLONOSCOPY  2013  ? COLONOSCOPY WITH PROPOFOL N/A 06/02/2018  ? Procedure: COLONOSCOPY WITH PROPOFOL;  Surgeon: Jonathon Bellows, MD;  Location: [facility] ENDOSCOPY;  Service: Gastroenterology;  Laterality: N/A;  ? ? ?Family History  ?Problem Relation Age of Onset  ? Diabetes Mother   ? Kidney disease Mother   ? Diabetes Brother   ? Diabetes Brother   ? Diabetes Maternal Grandfather   ? Hypertension Maternal Grandfather   ? Stroke Maternal Grandfather   ? ? ?Social History  ? ?Tobacco Use  ? Smoking status: Never  ? Smokeless tobacco: Never  ?Substance Use Topics  ? Alcohol use: No  ?  Alcohol/week: 0.0 standard drinks  ? ? ? ?Current Outpatient Medications:  ?  Azelastine HCl 137 MCG/SPRAY SOLN, PLACE 2 SPRAYS INTO BOTH NOSTRILS 2 (TWO)  TIMES DAILY. USE IN EACH NOSTRIL AS DIRECTED, Disp: 30 mL, Rfl: 1 ?  BESIVANCE 0.6 % SUSP, INSTILL ONE DROP INTO BOTH EYES 4 TIMES A DAY FOR 2 DAYS AFTER EACH MONTHLY EYE INJECTION, Disp: , Rfl:  ?  brimonidine (ALPHAGAN) 0.15 % ophthalmic solution, Apply to eye., Disp: , Rfl:  ?  busPIRone (BUSPAR) 5 MG tablet, TAKE 1 TABLET BY MOUTH TWICE A DAY AS NEEDED, Disp: 60 tablet, Rfl: 0 ?  Cholecalciferol (VITAMIN D3) 50 MCG (2000 UT) capsule, Take 1 capsule (2,000 Units total) by mouth daily., Disp: 100 capsule, Rfl: 1 ?  Dapagliflozin-metFORMIN HCl ER (XIGDUO XR) 04-999 MG TB24, Take 2 tablets by  mouth daily., Disp: , Rfl:  ?  Elastic Bandages & Supports (ABDOMINAL BINDER/ELASTIC 3XL) MISC, 1 Units by Does not apply route daily., Disp: 2 each, Rfl: 2 ?  glipiZIDE (GLUCOTROL XL) 10 MG 24 hr tablet, Take 1 tablet by mouth daily., Disp: , Rfl:  ?  glucose blood (ONE TOUCH ULTRA TEST) test strip, Use as instructed, Disp: 300 each, Rfl: 3 ?  hydrALAZINE (APRESOLINE) 10 MG tablet, Take 1 tablet (10 mg total) by mouth daily as needed. Bp above 145/90, Disp: 90 tablet, Rfl: 0 ?  Insulin Pen Needle (B-D UF III MINI PEN NEEDLES) 31G X 5 MM MISC, FOR USE WITH INJECTABLE MEDICINE, ONCE A DAY, Disp: 90 each, Rfl: 3 ?  modafinil (PROVIGIL) 200 MG tablet, Take 1 tablet (200 mg total) by mouth daily., Disp: 90 tablet, Rfl: 0 ?  Nebivolol HCl 20 MG TABS, Take 1 tablet (20 mg total) by mouth daily. In place of metoprolol, Disp: 90 tablet, Rfl: 1 ?  olmesartan-hydrochlorothiazide (BENICAR HCT) 40-25 MG tablet, Take 1 tablet by mouth daily., Disp: 90 tablet, Rfl: 1 ?  ONETOUCH DELICA LANCETS FINE MISC, 1 Device by Does not apply route 3 (three) times daily., Disp: 300 each, Rfl: 3 ?  rosuvastatin (CRESTOR) 40 MG tablet, Take 1 tablet (40 mg total) by mouth daily., Disp: 90 tablet, Rfl: 1 ?  tadalafil (CIALIS) 20 MG tablet, Take 1 tablet (20 mg total) by mouth every three (3) days as needed for erectile dysfunction., Disp: 30 tablet, Rfl: 0 ?  tirzepatide (MOUNJARO) 10 MG/0.5ML Pen, Inject 10 mg into the skin once a week., Disp: , Rfl:  ? ?No Known Allergies ? ?I personally reviewed active problem list, medication list, allergies, family history, social history, health maintenance with the patient/caregiver today. ? ? ?ROS ? ?*** ? ?Objective ? ?There were no vitals filed for this visit. ? ?There is no height or weight on file to calculate BMI. ? ?Physical Exam ?*** ? ?No results found for this or any previous visit (from the past 2160 hour(s)). ? ?Diabetic Foot Exam: ?Diabetic Foot Exam - Simple   ?No data filed ?   ? ?*** ? ?PHQ2/9: ?Depression screen [facility] 2/9 12/14/2021 09/14/2021 06/10/2021 05/01/2021 03/09/2021  ?Decreased Interest 0 0 0 0 0  ?Down, Depressed, Hopeless 0 0 0 0 0  ?PHQ - 2 Score 0 0 0 0 0  ?Altered sleeping 0 - 0 - 1  ?Tired, decreased energy 0 - 0 - 0  ?Change in appetite 0 - 0 - 0  ?Feeling bad or failure about yourself  0 - 0 - 0  ?Trouble concentrating 0 - 0 - 0  ?Moving slowly or fidgety/restless 0 - 0 - 0  ?Suicidal thoughts 0 - 0 - 0  ?PHQ-9 Score 0 - 0 - 1  ?Difficult  doing work/chores - - - - Not difficult at all  ?Some recent data might be hidden  ?  ?phq 9 is {gen pos neg:[id number]} ? ? ?Fall Risk: ?Fall Risk  12/14/2021 09/14/2021 06/10/2021 05/01/2021 03/09/2021  ?Falls in the past year? 0 0 0 0 0  ?Number falls in past yr: 0 - 0 0 0  ?Injury with Fall? 0 - 0 0 0  ?Risk for fall due to : No Fall Risks - - - -  ?Follow up Falls prevention discussed - - - -  ? ? ? ? ?Functional Status Survey: ?  ? ? ? ?Assessment & Plan ? ?*** ?There are no diagnoses linked to this encounter.  ?

## 2022-03-15 ENCOUNTER — Ambulatory Visit: Payer: BC Managed Care – PPO | Admitting: Family Medicine

## 2022-03-15 DIAGNOSIS — E1169 Type 2 diabetes mellitus with other specified complication: Secondary | ICD-10-CM

## 2022-04-02 ENCOUNTER — Encounter (INDEPENDENT_AMBULATORY_CARE_PROVIDER_SITE_OTHER): Payer: BC Managed Care – PPO | Admitting: Ophthalmology

## 2022-04-02 DIAGNOSIS — E113393 Type 2 diabetes mellitus with moderate nonproliferative diabetic retinopathy without macular edema, bilateral: Secondary | ICD-10-CM

## 2022-04-02 DIAGNOSIS — I10 Essential (primary) hypertension: Secondary | ICD-10-CM | POA: Diagnosis not present

## 2022-04-02 DIAGNOSIS — H43813 Vitreous degeneration, bilateral: Secondary | ICD-10-CM | POA: Diagnosis not present

## 2022-04-02 DIAGNOSIS — H35033 Hypertensive retinopathy, bilateral: Secondary | ICD-10-CM

## 2022-05-03 NOTE — Progress Notes (Signed)
Name: Clarence Black   MRN: [id number]    DOB: 05-15-1966   Date:05/04/2022 ? ?     Progress Note ? ?Subjective ? ?Chief Complaint ? ?Follow Up ? ?HPI ? ?HTN: he has been taking medications , Bystolic, Benicar/hctz and has been taking Hydralazine before at work ( around 3 am) even though we had discussed to take it prn. Denies chest pain or palpitation. No dizziness. BP has been under control lately  ? ?Umbilical hernia: he saw surgeon and was advised to lose weight first Unchanged  ? ?DMII: he sees Dr. Gabriel Carina, no recent episodes of hypoglycemia. He has retinopathy and sees Dr. West Pugh , last A1C was done at her office and it was 8.8 % he states at that time had a gap on GLP-1 agonist since insurance did not covered Mounjarno , he is now back on Ozempic 2 mg and hopefully his level will improve.  He is on ARB for microalbuminuria,takes  statin therapy for hyperlipidemia. Marland Kitchen He denies polyphagia, polydipsia or polyuria. LDL is under control last level at 41 . Up to date with eye exams an no need for injections in about 6 months  ? ?Obesity: BMI above 40, he is a truck driver, reviewed importance of life style modification , he I son Ozempic and weight is down 9 lbs since last visit  ? 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  ?OSA: he wears his CPAP most nights of the week. Modafinil helps him stay awake. 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 ?Vitamin D deficiency: he finished rx vitami D , advised to take 2000 units otc from now on   ? ?Dyslipidemia: he is on Rosuvastatin 40 , LDL is at goal. No side effects  ? ?BPH: he has some dribbling at the end of micturition, advised PSA today and regular CPE ? ?Patient Active Problem List  ? Diagnosis Date Noted  ? Hypertension associated with type 2 diabetes mellitus ([place]) 06/10/2021  ? Dyslipidemia due to type 2  diabetes mellitus ([place]) 06/10/2021  ? Diabetes mellitus type 2 in obese ([place]) 06/10/2021  ? BPH with obstruction/lower urinary tract symptoms 04/29/2020  ? 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 Vitamin D deficiency 01/29/2019  ? Sleep apnea 09/29/2018  ? Diastasis recti 12/31/2016  ? 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 Hyperlipidemia LDL goal <70 06/13/2015  ? Hypertension goal BP (blood pressure) < 130/80 06/13/2015  ? ED (erectile dysfunction) of organic origin 02/20/2015  ? Obesity, Class III, BMI 40-49.9 (morbid obesity) ([place]) 02/20/2015  ? ? ?Past Surgical History:  ?Procedure Laterality Date  ? COLONOSCOPY  2013  ? COLONOSCOPY WITH PROPOFOL N/A 06/02/2018  ? Procedure: COLONOSCOPY WITH PROPOFOL;  Surgeon: Jonathon Bellows, MD;  Location: [facility] ENDOSCOPY;  Service: Gastroenterology;  Laterality: N/A;  ? ? ?Family History  ?Problem Relation Age of Onset  ? Diabetes Mother   ? Kidney disease Mother   ? Diabetes Brother   ? Diabetes Brother   ? Diabetes Maternal Grandfather   ? Hypertension Maternal Grandfather   ? Stroke Maternal Grandfather   ? ? ?Social History  ? ?Tobacco Use  ? Smoking status: Never  ? Smokeless tobacco: Never  ?Substance Use Topics  ? Alcohol use: No  ?  Alcohol/week: 0.0 standard drinks  ? ? ? ?Current Outpatient Medications:  ?  Azelastine HCl 137 MCG/SPRAY SOLN, PLACE 2 SPRAYS INTO BOTH NOSTRILS 2 (TWO) TIMES DAILY. USE IN EACH NOSTRIL AS DIRECTED, Disp: 30 mL, Rfl: 1 ?  BESIVANCE 0.6 % SUSP, INSTILL ONE DROP INTO BOTH EYES 4 TIMES A DAY FOR 2 DAYS AFTER EACH MONTHLY EYE INJECTION, Disp: , Rfl:  ?  brimonidine (ALPHAGAN) 0.15 % ophthalmic solution, Apply to eye., Disp: , Rfl:  ?  Dapagliflozin-metFORMIN HCl ER (XIGDUO XR) 04-999 MG TB24, Take 2 tablets by mouth daily., Disp: , Rfl:  ?  glipiZIDE (GLUCOTROL XL) 10 MG 24 hr tablet, Take 1 tablet by mouth daily., Disp: , Rfl:  ?  glucose blood (ONE TOUCH ULTRA TEST) test strip, Use as  instructed, Disp: 300 each, Rfl: 3 ?  hydrALAZINE (APRESOLINE) 10 MG tablet, Take 1 tablet (10 mg total) by mouth daily as needed. Bp above 145/90, Disp: 90 tablet, Rfl: 0 ?  Insulin Pen Needle (B-D UF III MINI PEN NEEDLES) 31G X 5 MM MISC, FOR USE WITH INJECTABLE MEDICINE, ONCE A DAY, Disp: 90 each, Rfl: 3 ?  olmesartan-hydrochlorothiazide (BENICAR HCT) 40-25 MG tablet, Take 1 tablet by mouth daily., Disp: 90 tablet, Rfl: 1 ?  ONETOUCH DELICA LANCETS FINE MISC, 1 Device by Does not apply route 3 (three) times daily., Disp: 300 each, Rfl: 3 ?  Semaglutide, 2 MG/DOSE, 8 MG/3ML SOPN, Inject 2 mg into the skin once a week., Disp: , Rfl:  ?  busPIRone (BUSPAR) 5 MG tablet, TAKE 1 TABLET BY MOUTH TWICE A DAY AS NEEDED (Patient not taking: Reported on 05/04/2022), Disp: 60 tablet, Rfl: 0 ?  modafinil (PROVIGIL) 200 MG tablet, Take 1 tablet (200 mg total) by mouth daily., Disp: 90 tablet, Rfl: 0 ?  Nebivolol HCl 20 MG TABS, Take 1 tablet (20 mg total) by mouth daily. In place of metoprolol, Disp: 90 tablet, Rfl: 1 ?  rosuvastatin (CRESTOR) 40 MG tablet, Take 1 tablet (40 mg total) by mouth daily., Disp: 90 tablet, Rfl: 1 ?  tadalafil (CIALIS) 20 MG tablet, Take 1 tablet (20 mg total) by mouth every three (3) days as needed for erectile dysfunction., Disp: 30 tablet, Rfl: 0 ? ?No Known Allergies ? ?I personally reviewed active problem list, medication list, allergies, family history, social history, health maintenance with the patient/caregiver today. ? ? ?ROS ? ?Constitutional: Negative for fever , positive for weight change.  ?Respiratory: Negative for cough and shortness of breath.   ?Cardiovascular: Negative for chest pain or palpitations.  ?Gastrointestinal: Negative for abdominal pain, no bowel changes.  ?Musculoskeletal: Negative for gait problem or joint swelling.  ?Skin: Negative for rash.  ?Neurological: Negative for dizziness or headache.  ?No other specific complaints in a complete review of systems (except as  listed in HPI above).  ? ?Objective ? ?Vitals:  ? 05/04/22 1346  ?BP: 130/82  ?Pulse: 89  ?Resp: 16  ?SpO2: 97%  ?Weight: 290 lb (131.5 kg)  ?Height: '5\' 10"'$  (1.778 m)  ? ? ?Body mass index is 41.61 kg/m?. ? ?Physical Exam ? ?Constitutional: Patient appears well-developed and well-nourished. Obese  No distress.  ?HEENT: head atraumatic, normocephalic, pupils equal and reactive to light, neck supple, ?Cardiovascular: Normal rate, regular rhythm and normal heart sounds.  No murmur heard. No BLE edema. ?Pulmonary/Chest: Effort normal and breath sounds normal. No respiratory distress. ?Abdominal: Soft.  There is no tenderness. ?Psychiatric: Patient has a normal mood and affect. behavior is normal.  Judgment and thought content normal.  ? ?Recent Results (from the past 2160 hour(s))  ?Hemoglobin A1c     Status: None  ? Collection Time: 02/05/22 12:00 AM  ?Result Value Ref Range  ? Hemoglobin A1C 8.8   ? ? ?PHQ2/9: ? ?  05/04/2022  ?  1:46 PM 12/14/2021  ?  3:14 PM 09/14/2021  ? 11:45 AM 06/10/2021  ?  2:14 PM 05/01/2021  ?  3:00 PM  ?Depression screen PHQ 2/9  ?Decreased Interest 0 0 0 0 0  ?Down, Depressed, Hopeless 0 0 0 0 0  ?PHQ - 2 Score 0 0 0 0 0  ?Altered sleeping 0 0  0   ?Tired, decreased energy 0 0  0   ?Change in appetite 0 0  0   ?Feeling bad or failure about yourself  0 0  0   ?Trouble concentrating 0 0  0   ?Moving slowly or fidgety/restless 0 0  0   ?Suicidal thoughts 0 0  0   ?PHQ-9 Score 0 0  0   ?  ?phq 9 is negative ? ? ?Fall Risk: ? ?  05/04/2022  ?  1:46 PM 12/14/2021  ?  3:14 PM 09/14/2021  ? 11:45 AM 06/10/2021  ?  2:14 PM 05/01/2021  ?  3:00 PM  ?Fall Risk   ?Falls in the past year? 0 0 0 0 0  ?Number falls in past yr: 0 0  0 0  ?Injury with Fall? 0 0  0 0  ?Risk for fall due to : No Fall Risks No Fall Risks     ?Follow up Falls prevention discussed Falls prevention discussed     ? ? ? ? ?Functional Status Survey: ?Is the patient deaf or have difficulty hearing?: No ?Does the patient have difficulty seeing,  even when wearing glasses/contacts?: No ?Does the patient have difficulty concentrating, remembering, or making decisions?: No ?Does the patient have difficulty walking or climbing stairs?: No ?Does the patien

## 2022-05-04 ENCOUNTER — Encounter: Payer: Self-pay | Admitting: Family Medicine

## 2022-05-04 ENCOUNTER — Other Ambulatory Visit: Payer: Self-pay | Admitting: Family Medicine

## 2022-05-04 ENCOUNTER — Ambulatory Visit: Payer: BC Managed Care – PPO | Admitting: Family Medicine

## 2022-05-04 VITALS — BP 130/82 | HR 89 | Resp 16 | Ht 70.0 in | Wt 290.0 lb

## 2022-05-04 DIAGNOSIS — E785 Hyperlipidemia, unspecified: Secondary | ICD-10-CM

## 2022-05-04 DIAGNOSIS — E66813 Obesity, class 3: Secondary | ICD-10-CM

## 2022-05-04 DIAGNOSIS — Z125 Encounter for screening for malignant neoplasm of prostate: Secondary | ICD-10-CM

## 2022-05-04 DIAGNOSIS — G4733 Obstructive sleep apnea (adult) (pediatric): Secondary | ICD-10-CM

## 2022-05-04 DIAGNOSIS — N528 Other male erectile dysfunction: Secondary | ICD-10-CM

## 2022-05-04 DIAGNOSIS — I152 Hypertension secondary to endocrine disorders: Secondary | ICD-10-CM

## 2022-05-04 DIAGNOSIS — I10 Essential (primary) hypertension: Secondary | ICD-10-CM

## 2022-05-04 DIAGNOSIS — E1159 Type 2 diabetes mellitus with other circulatory complications: Secondary | ICD-10-CM | POA: Diagnosis not present

## 2022-05-04 DIAGNOSIS — N400 Enlarged prostate without lower urinary tract symptoms: Secondary | ICD-10-CM

## 2022-05-04 DIAGNOSIS — G4726 Circadian rhythm sleep disorder, shift work type: Secondary | ICD-10-CM | POA: Diagnosis not present

## 2022-05-04 DIAGNOSIS — E11311 Type 2 diabetes mellitus with unspecified diabetic retinopathy with macular edema: Secondary | ICD-10-CM

## 2022-05-04 DIAGNOSIS — E1169 Type 2 diabetes mellitus with other specified complication: Secondary | ICD-10-CM

## 2022-05-04 DIAGNOSIS — E559 Vitamin D deficiency, unspecified: Secondary | ICD-10-CM

## 2022-05-04 DIAGNOSIS — N529 Male erectile dysfunction, unspecified: Secondary | ICD-10-CM

## 2022-05-04 MED ORDER — NEBIVOLOL HCL 20 MG PO TABS: 20.0000 mg | ORAL_TABLET | Freq: Every day | ORAL | 1 refills | Status: DC

## 2022-05-04 MED ORDER — ROSUVASTATIN CALCIUM 40 MG PO TABS: 40.0000 mg | ORAL_TABLET | Freq: Every day | ORAL | 1 refills | Status: DC

## 2022-05-04 MED ORDER — MODAFINIL 200 MG PO TABS: 200.0000 mg | ORAL_TABLET | Freq: Every day | ORAL | 0 refills | Status: DC

## 2022-05-04 MED ORDER — TADALAFIL 20 MG PO TABS: 20.0000 mg | ORAL_TABLET | ORAL | 0 refills | Status: DC | PRN

## 2022-05-04 NOTE — Assessment & Plan Note (Signed)
Well controlled he is always afraid of DOT physicals, advised to take a hdyralazine 30 minutes prior to visit and hold modafinil that day  ?

## 2022-05-04 NOTE — Assessment & Plan Note (Signed)
Doing on cialis, refills sent to [facility]  ?

## 2022-05-04 NOTE — Assessment & Plan Note (Signed)
Doing well on Ozempic lost 9 lbs since Dec 2022 ?

## 2022-05-04 NOTE — Assessment & Plan Note (Signed)
Compliant with CPAP and takes modafinil to stay alert on work days  ?

## 2022-05-04 NOTE — Assessment & Plan Note (Signed)
Continue statin therapy and follow up with Dr. Gabriel Carina, A1C was 8.8 % but is now taking Ozempic weekly, he had a gap for about 2 months when waiting for [facility] prescription ?

## 2022-05-05 LAB — COMPLETE METABOLIC PANEL WITHOUT GFR
AG Ratio: 1.8 (calc) (ref 1.0–2.5)
ALT: 21 U/L (ref 9–46)
AST: 22 U/L (ref 10–35)
Albumin: 4.5 g/dL (ref 3.6–5.1)
Alkaline phosphatase (APISO): 53 U/L (ref 35–144)
BUN: 17 mg/dL (ref 7–25)
CO2: 28 mmol/L (ref 20–32)
Calcium: 9.8 mg/dL (ref 8.6–10.3)
Chloride: 101 mmol/L (ref 98–110)
Creat: 1.23 mg/dL (ref 0.70–1.30)
Globulin: 2.5 g/dL (ref 1.9–3.7)
Glucose, Bld: 185 mg/dL — ABNORMAL HIGH (ref 65–99)
Potassium: 4 mmol/L (ref 3.5–5.3)
Sodium: 140 mmol/L (ref 135–146)
Total Bilirubin: 0.7 mg/dL (ref 0.2–1.2)
Total Protein: 7 g/dL (ref 6.1–8.1)
eGFR: 70 mL/min/1.73m2

## 2022-05-05 LAB — PSA: PSA: 0.35 ng/mL (ref <=4.00)

## 2022-05-06 NOTE — Progress Notes (Signed)
Name: Clarence Black   MRN: [id number]    DOB: 1966-10-30   Date:05/07/2022 ? ?     Progress Note ? ?Subjective ? ?Chief Complaint ? ?Annual Exam ? ?HPI ? ?Patient presents for annual CPE. ? ?IPSS Questionnaire (AUA-7): ?Over the past month?   ?1)  How often have you had a sensation of not emptying your bladder completely after you finish urinating?  0 - Not at all  ?2)  How often have you had to urinate again less than two hours after you finished urinating? 1 - Less than 1 time in 5  ?3)  How often have you found you stopped and started again several times when you urinated?  1 - Less than 1 time in 5  ?4) How difficult have you found it to postpone urination?  0 - Not at all  ?5) How often have you had a weak urinary stream?  0 - Not at all  ?6) How often have you had to push or strain to begin urination?  0 - Not at all  ?7) How many times did you most typically get up to urinate from the time you went to bed until the time you got up in the morning?  1 - 1 time  ?Total score:  0-7 mildly symptomatic  ? 8-19 moderately symptomatic  ? 20-35 severely symptomatic  ?  ?Mild and he does not want medications at this time ? ?Diet: trying to follow a diabetic diet  ?Exercise: walks his dog a few days a week. For about 30 minutes  ? ?Depression: phq 9 is negative ? ?  05/04/2022  ?  1:46 PM 12/14/2021  ?  3:14 PM 09/14/2021  ? 11:45 AM 06/10/2021  ?  2:14 PM 05/01/2021  ?  3:00 PM  ?Depression screen PHQ 2/9  ?Decreased Interest 0 0 0 0 0  ?Down, Depressed, Hopeless 0 0 0 0 0  ?PHQ - 2 Score 0 0 0 0 0  ?Altered sleeping 0 0  0   ?Tired, decreased energy 0 0  0   ?Change in appetite 0 0  0   ?Feeling bad or failure about yourself  0 0  0   ?Trouble concentrating 0 0  0   ?Moving slowly or fidgety/restless 0 0  0   ?Suicidal thoughts 0 0  0   ?PHQ-9 Score 0 0  0   ? ? ?Hypertension:  ?BP Readings from Last 3 Encounters:  ?05/07/22 126/82  ?05/04/22 130/82  ?12/14/21 134/86  ? ? ?Obesity: ?Wt Readings from Last 3 Encounters:   ?05/07/22 290 lb (131.5 kg)  ?05/04/22 290 lb (131.5 kg)  ?12/14/21 299 lb (135.6 kg)  ? ?BMI Readings from Last 3 Encounters:  ?05/07/22 41.61 kg/m?  ?05/04/22 41.61 kg/m?  ?12/14/21 42.90 kg/m?  ?  ? ?Lipids:  ?Lab Results  ?Component Value Date  ? CHOL 112 03/09/2021  ? CHOL 132 08/04/2020  ? CHOL 139 02/06/2020  ? ?Lab Results  ?Component Value Date  ? HDL 39 (L) 03/09/2021  ? HDL 39 08/04/2020  ? HDL 39 (L) 02/06/2020  ? ?Lab Results  ?Component Value Date  ? LDLCALC 53 03/09/2021  ? [place] 72 08/04/2020  ? [place] 81 02/06/2020  ? ?Lab Results  ?Component Value Date  ? TRIG 114 03/09/2021  ? TRIG 106 08/04/2020  ? TRIG 95 02/06/2020  ? ?Lab Results  ?Component Value Date  ? CHOLHDL 2.9 03/09/2021  ? CHOLHDL 3.6 02/06/2020  ? CHOLHDL  3.1 01/29/2019  ? ?No results found for: LDLDIRECT ?Glucose:  ?Glucose, Bld  ?Date Value Ref Range Status  ?05/04/2022 185 (H) 65 - 99 mg/dL Final  ?  Comment:  ?  . ?           Fasting reference interval ?. ?For someone without known diabetes, a glucose ?value >125 mg/dL indicates that they may have ?diabetes and this should be confirmed with a ?follow-up test. ?. ?  ?03/09/2021 122 (H) 65 - 99 mg/dL Final  ?  Comment:  ?  . ?           Fasting reference interval ?. ?For someone without known diabetes, a glucose value ?between 100 and 125 mg/dL is consistent with ?prediabetes and should be confirmed with a ?follow-up test. ?. ?  ?02/06/2020 94 65 - 99 mg/dL Final  ?  Comment:  ?  . ?           Fasting reference interval ?. ?  ? ?Glucose-Capillary  ?Date Value Ref Range Status  ?06/02/2018 173 (H) 65 - 99 mg/dL Final  ? ? ?[place] Office Visit from 05/07/2022 in [facility]  ?AUDIT-C Score 0  ? ?  ? ? ?Married ?STD testing and prevention (HIV/chl/gon/syphilis): 03/09/21 ?Sexual history: married one partner  ?Hep C Screening: 04/29/20 ?Skin cancer: Discussed monitoring for atypical lesions ?Colorectal cancer: 06/02/18 ?Prostate cancer:   ? ?Lab Results   ?Component Value Date  ? PSA 0.35 05/04/2022  ? PSA 0.28 05/01/2021  ? PSA 0.3 04/29/2020  ? ? ? ?Lung cancer:  Low Dose CT Chest recommended if Age 57-80 years, 30 pack-year currently smoking OR have quit w/in 15years. Patient  no a candidate for screening   ?AAA: The USPSTF recommends one-time screening with ultrasonography in men ages 27 to 77 years who have ever smoked. Patient  is not  a candidate for screening  ?ECG:  05/05/20 ? ?Vaccines:  ? ?Tdap: up to date ?Shingrix: up to date  ?Pneumonia: up to date  ?Flu: up to date ?COVID-19:discussed booster  ? ?Advanced Care Planning: A voluntary discussion about advance care planning including the explanation and discussion of advance directives.  Discussed health care proxy and Living will, and the patient was able to identify a health care proxy as wife.  Patient does not have a living will and power of attorney of health care  ? ?Patient Active Problem List  ? Diagnosis Date Noted  ? Hypertension associated with type 2 diabetes mellitus ([place]) 06/10/2021  ? Dyslipidemia due to type 2 diabetes mellitus ([place]) 06/10/2021  ? Diabetes mellitus type 2 in obese ([place]) 06/10/2021  ? BPH with obstruction/lower urinary tract symptoms 04/29/2020  ? 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 Vitamin D deficiency 01/29/2019  ? OSA on CPAP 09/29/2018  ? Diastasis recti 12/31/2016  ? 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 Hyperlipidemia LDL goal <70 06/13/2015  ? Hypertension goal BP (blood pressure) < 130/80 06/13/2015  ? ED (erectile dysfunction) of organic origin 02/20/2015  ? Obesity, Class III, BMI 40-49.9 (morbid obesity) ([place]) 02/20/2015  ? ? ?Past Surgical History:  ?Procedure Laterality Date  ? COLONOSCOPY  2013  ? COLONOSCOPY WITH PROPOFOL N/A 06/02/2018  ? Procedure: COLONOSCOPY WITH PROPOFOL;  Surgeon: Jonathon Bellows, MD;  Location: [facility] ENDOSCOPY;  Service: Gastroenterology;  Laterality: N/A;  ? ? ?Family History   ?Problem Relation Age of Onset  ? Diabetes Mother   ?  Kidney disease Mother   ? Diabetes Brother   ? Diabetes Brother   ? Diabetes Maternal Grandfather   ? Hypertension Maternal Grandfather   ? Stroke Maternal Grandfather   ? ? ?Social History  ? ?Socioeconomic History  ? Marital status: Married  ?  Spouse name: Jeanett Schlein   ? Number of children: 3  ? Years of education: Not on file  ? Highest education level: Not on file  ?Occupational History  ? Occupation: driver   ?Tobacco Use  ? Smoking status: Never  ? Smokeless tobacco: Never  ?Vaping Use  ? Vaping Use: Never used  ?Substance and Sexual Activity  ? Alcohol use: No  ?  Alcohol/week: 0.0 standard drinks  ? Drug use: No  ? Sexual activity: Yes  ?  Partners: Female  ?Other Topics Concern  ? Not on file  ?Social History Narrative  ? Not on file  ? ?Social Determinants of Health  ? ?Financial Resource Strain: Low Risk   ? Difficulty of Paying Living Expenses: Not hard at all  ?Food Insecurity: No Food Insecurity  ? Worried About Charity fundraiser in the Last Year: Never true  ? Ran Out of Food in the Last Year: Never true  ?Transportation Needs: No Transportation Needs  ? Lack of Transportation (Medical): No  ? Lack of Transportation (Non-Medical): No  ?Physical Activity: Insufficiently Active  ? Days of Exercise per Week: 3 days  ? Minutes of Exercise per Session: 30 min  ?Stress: No Stress Concern Present  ? Feeling of Stress : Only a little  ?Social Connections: Socially Integrated  ? Frequency of Communication with Friends and Family: More than three times a week  ? Frequency of Social Gatherings with Friends and Family: Three times a week  ? Attends Religious Services: More than 4 times per year  ? Active Member of Clubs or Organizations: Yes  ? Attends Archivist Meetings: More than 4 times per year  ? Marital Status: Married  ?Intimate Partner Violence: Not At Risk  ? Fear of Current or Ex-Partner: No  ? Emotionally Abused: No  ? Physically  Abused: No  ? Sexually Abused: No  ? ? ? ?Current Outpatient Medications:  ?  Azelastine HCl 137 MCG/SPRAY SOLN, PLACE 2 SPRAYS INTO BOTH NOSTRILS 2 (TWO) TIMES DAILY. USE IN EACH NOSTRIL AS DIRECTED, Disp: 30 mL, Rfl: 1

## 2022-05-06 NOTE — Patient Instructions (Signed)

## 2022-05-07 ENCOUNTER — Encounter: Payer: Self-pay | Admitting: Family Medicine

## 2022-05-07 ENCOUNTER — Ambulatory Visit (INDEPENDENT_AMBULATORY_CARE_PROVIDER_SITE_OTHER): Payer: BC Managed Care – PPO | Admitting: Family Medicine

## 2022-05-07 VITALS — BP 126/82 | HR 94 | Resp 16 | Ht 70.0 in | Wt 290.0 lb

## 2022-05-07 DIAGNOSIS — Z9989 Dependence on other enabling machines and devices: Secondary | ICD-10-CM | POA: Diagnosis not present

## 2022-05-07 DIAGNOSIS — G4733 Obstructive sleep apnea (adult) (pediatric): Secondary | ICD-10-CM

## 2022-05-07 DIAGNOSIS — Z0000 Encounter for general adult medical examination without abnormal findings: Secondary | ICD-10-CM

## 2022-05-25 ENCOUNTER — Telehealth: Payer: Self-pay | Admitting: Family Medicine

## 2022-05-25 NOTE — Telephone Encounter (Signed)
Lattie Haw calling from [facility] is calling to ask what kind of sleep study will be preformed for the patient?  Cb- [phone]

## 2022-05-28 ENCOUNTER — Encounter (INDEPENDENT_AMBULATORY_CARE_PROVIDER_SITE_OTHER): Payer: BC Managed Care – PPO | Admitting: Ophthalmology

## 2022-05-28 DIAGNOSIS — H35033 Hypertensive retinopathy, bilateral: Secondary | ICD-10-CM | POA: Diagnosis not present

## 2022-05-28 DIAGNOSIS — H43813 Vitreous degeneration, bilateral: Secondary | ICD-10-CM | POA: Diagnosis not present

## 2022-05-28 DIAGNOSIS — E113393 Type 2 diabetes mellitus with moderate nonproliferative diabetic retinopathy without macular edema, bilateral: Secondary | ICD-10-CM | POA: Diagnosis not present

## 2022-05-28 DIAGNOSIS — I10 Essential (primary) hypertension: Secondary | ICD-10-CM | POA: Diagnosis not present

## 2022-06-02 NOTE — Telephone Encounter (Signed)
Called and verified order. They will fax form over for [place] to update so they can do the sleep study.

## 2022-06-07 LAB — PROTEIN / CREATININE RATIO, URINE
Albumin, U: 27
Creatinine, Urine: 78.7

## 2022-06-07 LAB — MICROALBUMIN / CREATININE URINE RATIO: Microalb Creat Ratio: 34.3

## 2022-07-23 ENCOUNTER — Encounter (INDEPENDENT_AMBULATORY_CARE_PROVIDER_SITE_OTHER): Payer: BC Managed Care – PPO | Admitting: Ophthalmology

## 2022-07-23 DIAGNOSIS — E113393 Type 2 diabetes mellitus with moderate nonproliferative diabetic retinopathy without macular edema, bilateral: Secondary | ICD-10-CM | POA: Diagnosis not present

## 2022-07-23 DIAGNOSIS — I10 Essential (primary) hypertension: Secondary | ICD-10-CM

## 2022-07-23 DIAGNOSIS — H35033 Hypertensive retinopathy, bilateral: Secondary | ICD-10-CM | POA: Diagnosis not present

## 2022-07-23 DIAGNOSIS — H43813 Vitreous degeneration, bilateral: Secondary | ICD-10-CM | POA: Diagnosis not present

## 2022-07-29 ENCOUNTER — Other Ambulatory Visit: Payer: Self-pay | Admitting: Family Medicine

## 2022-07-29 DIAGNOSIS — I10 Essential (primary) hypertension: Secondary | ICD-10-CM

## 2022-08-02 ENCOUNTER — Other Ambulatory Visit: Payer: Self-pay | Admitting: Family Medicine

## 2022-08-02 DIAGNOSIS — I10 Essential (primary) hypertension: Secondary | ICD-10-CM

## 2022-08-05 ENCOUNTER — Ambulatory Visit: Payer: BC Managed Care – PPO | Attending: Neurology

## 2022-08-05 DIAGNOSIS — G4733 Obstructive sleep apnea (adult) (pediatric): Secondary | ICD-10-CM | POA: Diagnosis not present

## 2022-08-05 DIAGNOSIS — R0683 Snoring: Secondary | ICD-10-CM | POA: Diagnosis not present

## 2022-08-24 ENCOUNTER — Encounter (INDEPENDENT_AMBULATORY_CARE_PROVIDER_SITE_OTHER): Payer: BC Managed Care – PPO | Admitting: Ophthalmology

## 2022-08-26 ENCOUNTER — Other Ambulatory Visit: Payer: Self-pay | Admitting: Family Medicine

## 2022-08-26 DIAGNOSIS — G4733 Obstructive sleep apnea (adult) (pediatric): Secondary | ICD-10-CM

## 2022-08-26 DIAGNOSIS — G4726 Circadian rhythm sleep disorder, shift work type: Secondary | ICD-10-CM

## 2022-08-26 DIAGNOSIS — I10 Essential (primary) hypertension: Secondary | ICD-10-CM

## 2022-08-27 ENCOUNTER — Other Ambulatory Visit: Payer: Self-pay

## 2022-08-27 ENCOUNTER — Telehealth: Payer: Self-pay | Admitting: Family Medicine

## 2022-08-27 DIAGNOSIS — G4733 Obstructive sleep apnea (adult) (pediatric): Secondary | ICD-10-CM

## 2022-08-27 NOTE — Telephone Encounter (Signed)
Copied from [place] [phone]. Topic: General - Inquiry >> Aug 27, 2022 11:15 AM Clarence Black wrote: Reason for CRM: Pt is requesting a call back to discuss sleep study results.   Please advise.

## 2022-08-27 NOTE — Telephone Encounter (Signed)
Called patient and discussed sleep study results. I let him know their recommendations and that we placed the order for a PAP titration. Answered all questions, patient gave verbal understanding.

## 2022-09-22 ENCOUNTER — Other Ambulatory Visit: Payer: Self-pay | Admitting: Family Medicine

## 2022-09-22 DIAGNOSIS — G4726 Circadian rhythm sleep disorder, shift work type: Secondary | ICD-10-CM

## 2022-09-22 DIAGNOSIS — G4733 Obstructive sleep apnea (adult) (pediatric): Secondary | ICD-10-CM

## 2022-09-22 NOTE — Telephone Encounter (Signed)
Requested medication (s) are due for refill today: yes  Requested medication (s) are on the active medication list: yes    Last refill: 05/04/22  #90  0 refills  Future visit scheduled yes 11/08/22  Notes to clinic:Off protocol, please review. Thank you.  Requested Prescriptions  Pending Prescriptions Disp Refills   modafinil (PROVIGIL) 200 MG tablet [Pharmacy Med Name: MODAFINIL 200 MG TABLET] 90 tablet 0    Sig: TAKE 1 TABLET BY MOUTH DAILY     Off-Protocol Failed - 09/22/2022 12:06 PM      Failed - Medication not assigned to a protocol, review manually.      Passed - Valid encounter within last 12 months    Recent Outpatient Visits           4 months ago Well adult exam   [facility], Drue Stager, MD   4 months ago Hypertension associated with type 2 diabetes mellitus [facility])   [facility], Drue Stager, MD   9 months ago Dyslipidemia due to type 2 diabetes mellitus [facility])   [facility] Steele Sizer, MD   1 year ago Umbilical hernia without obstruction and without gangrene   [facility] Steele Sizer, MD   1 year ago OSA (obstructive sleep apnea)   [facility] Steele Sizer, MD       Future Appointments             In 1 month Steele Sizer, MD [facility], [place]   In 7 months Steele Sizer, MD [facility], [facility]

## 2022-09-24 ENCOUNTER — Encounter (INDEPENDENT_AMBULATORY_CARE_PROVIDER_SITE_OTHER): Payer: BC Managed Care – PPO | Admitting: Ophthalmology

## 2022-09-24 DIAGNOSIS — H35033 Hypertensive retinopathy, bilateral: Secondary | ICD-10-CM

## 2022-09-24 DIAGNOSIS — E113393 Type 2 diabetes mellitus with moderate nonproliferative diabetic retinopathy without macular edema, bilateral: Secondary | ICD-10-CM

## 2022-09-24 DIAGNOSIS — H43813 Vitreous degeneration, bilateral: Secondary | ICD-10-CM

## 2022-09-24 DIAGNOSIS — I10 Essential (primary) hypertension: Secondary | ICD-10-CM

## 2022-10-18 LAB — BASIC METABOLIC PANEL
BUN: 18 (ref 4–21)
CO2: 29 — AB (ref 13–22)
Chloride: 103 (ref 99–108)
Creatinine: 1.1 (ref 0.6–1.3)
Glucose: 136
Potassium: 3.9 mEq/L (ref 3.5–5.1)
Sodium: 142 (ref 137–147)

## 2022-10-18 LAB — COMPREHENSIVE METABOLIC PANEL
Calcium: 9.3 (ref 8.7–10.7)
eGFR: 80

## 2022-10-18 LAB — HEMOGLOBIN A1C: Hemoglobin A1C: 7.1

## 2022-11-05 NOTE — Progress Notes (Unsigned)
Name: Clarence Black   MRN: [id number]    DOB: 04-Jan-1966   Date:11/08/2022       Progress Note  Subjective  Chief Complaint  Follow Up  HPI  HTN: he has been taking medications , Bystolic, Benicar/hctz and Hydralazine before his work shift around 10 pm .  Denies chest pain or palpitation. No dizziness. BP has been under control lately   Umbilical hernia: he saw surgeon and was advised to lose weight first Unchanged    DMII: he sees Dr. Gabriel Carina, no recent episodes of hypoglycemia. He has retinopathy and sees Dr. West Pugh , last A1C was done 09/2022 was 7.1 %  he is out of  Ozempic 2 mg due to [employer], he has a rx of Trulicity to be picked up at the local pharmacy.   He is on ARB for microalbuminuria,takes  statin therapy for hyperlipidemia. Marland Kitchen He denies polyphagia, polydipsia or polyuria. LDL is under control . Up to date with eye exams .   Obesity: BMI above 40, he is a truck driver, reviewed importance of life style modification , he was on Ozempic , now off medication but will get Trulicity filled.   Anxiety: he states usually takes Buspar prn  only, gets nervous before DOT and bp spikes otherwise doing well . He still has medication at pharmacy    OSA: he wears his CPAP most days  Modafinil helps him stay awake. 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   Vitamin D deficiency: he is taking vitamin D otc    Dyslipidemia: he is on Rosuvastatin 40 , LDL is at goal. No side effects Continue medications  Family history of heart disease: he also has DM, HTN and obesity, Mother, grandmother , grandfather, one brother died at age 59 from heart attack and has a living brother that has heart disease   BPH: he has some dribbling at the end of micturition, he also has ED and is takign cialis   Patient Active Problem List   Diagnosis Date Noted   Hypertension associated with type 2 diabetes mellitus ([place]) 06/10/2021   Dyslipidemia due to type 2  diabetes mellitus ([place]) 06/10/2021   Diabetes mellitus type 2 in obese ([place]) 06/10/2021   BPH with obstruction/lower urinary tract symptoms 04/29/2020   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 ([place]) 08/06/2019   Vitamin D deficiency 01/29/2019   OSA on CPAP 09/29/2018   Diastasis recti 12/31/2016   Low testosterone in male 10/22/2016   Hyperlipidemia LDL goal <70 06/13/2015   Hypertension goal BP (blood pressure) < 130/80 06/13/2015   ED (erectile dysfunction) of organic origin 02/20/2015   Obesity, Class III, BMI 40-49.9 (morbid obesity) ([place]) 02/20/2015    Past Surgical History:  Procedure Laterality Date   COLONOSCOPY  2013   COLONOSCOPY WITH PROPOFOL N/A 06/02/2018   Procedure: COLONOSCOPY WITH PROPOFOL;  Surgeon: Jonathon Bellows, MD;  [facility];  Service: Gastroenterology;  Laterality: N/A;    Family History  Problem Relation Age of Onset   Diabetes Mother    Kidney disease Mother    Diabetes Brother    Diabetes Brother    Diabetes Maternal Grandfather    Hypertension Maternal Grandfather    Stroke Maternal Grandfather     Social History   Tobacco Use   Smoking status: Never   Smokeless tobacco: Never  Substance Use Topics   Alcohol use: No  Alcohol/week: 0.0 standard drinks of alcohol     Current Outpatient Medications:    Azelastine HCl 137 MCG/SPRAY SOLN, PLACE 2 SPRAYS INTO BOTH NOSTRILS 2 (TWO) TIMES DAILY. USE IN EACH NOSTRIL AS DIRECTED, Disp: 30 mL, Rfl: 1   BESIVANCE 0.6 % SUSP, INSTILL ONE DROP INTO BOTH EYES 4 TIMES A DAY FOR 2 DAYS AFTER EACH MONTHLY EYE INJECTION, Disp: , Rfl:    brimonidine (ALPHAGAN) 0.15 % ophthalmic solution, Apply to eye., Disp: , Rfl:    busPIRone (BUSPAR) 5 MG tablet, TAKE 1 TABLET BY MOUTH TWICE A DAY AS NEEDED, Disp: 60 tablet, Rfl: 0   Continuous Blood Gluc Sensor (FREESTYLE LIBRE 3 SENSOR) MISC, USE 1 EACH EVERY 14 (FOURTEEN) DAYS, Disp: , Rfl:    Dapagliflozin-metFORMIN  HCl ER (XIGDUO XR) 04-999 MG TB24, Take 2 tablets by mouth daily., Disp: , Rfl:    glipiZIDE (GLUCOTROL XL) 10 MG 24 hr tablet, Take 1 tablet by mouth daily., Disp: , Rfl:    glucose blood (ONE TOUCH ULTRA TEST) test strip, Use as instructed, Disp: 300 each, Rfl: 3   hydrALAZINE (APRESOLINE) 10 MG tablet, TAKE 1 TABLET (10 MG TOTAL) BY MOUTH DAILY AS NEEDED WHEN BLOOD PRESSURE ABOVE 145/90, Disp: 90 tablet, Rfl: 0   Insulin Pen Needle (B-D UF III MINI PEN NEEDLES) 31G X 5 MM MISC, FOR USE WITH INJECTABLE MEDICINE, ONCE A DAY, Disp: 90 each, Rfl: 3   modafinil (PROVIGIL) 200 MG tablet, TAKE 1 TABLET BY MOUTH DAILY, Disp: 90 tablet, Rfl: 0   olmesartan-hydrochlorothiazide (BENICAR HCT) 40-25 MG tablet, TAKE 1 TABLET BY MOUTH EVERY DAY, Disp: 90 tablet, Rfl: 1   ONETOUCH DELICA LANCETS FINE MISC, 1 Device by Does not apply route 3 (three) times daily., Disp: 300 each, Rfl: 3   TRULICITY 4.5 WF/0.9NA SOPN, Inject 4.5 mg into the skin once a week., Disp: , Rfl:    Nebivolol HCl 20 MG TABS, Take 1 tablet (20 mg total) by mouth daily. In place of metoprolol, Disp: 90 tablet, Rfl: 1   rosuvastatin (CRESTOR) 40 MG tablet, Take 1 tablet (40 mg total) by mouth daily., Disp: 90 tablet, Rfl: 1   tadalafil (CIALIS) 20 MG tablet, Take 1 tablet (20 mg total) by mouth every three (3) days as needed for erectile dysfunction., Disp: 90 tablet, Rfl: 0  No Known Allergies  I personally reviewed active problem list, medication list, allergies, family history, social history, health maintenance with the patient/caregiver today.   ROS  Constitutional: Negative for fever or weight change.  Respiratory: Negative for cough and shortness of breath.   Cardiovascular: Negative for chest pain or palpitations.  Gastrointestinal: Negative for abdominal pain, no bowel changes.  Musculoskeletal: Negative for gait problem or joint swelling.  Skin: Negative for rash.  Neurological: Negative for dizziness or headache.  No  other specific complaints in a complete review of systems (except as listed in HPI above).   Objective  Vitals:   11/08/22 1501  BP: 128/72  Pulse: 82  Resp: 16  SpO2: 98%  Weight: 292 lb (132.5 kg)  Height: _0  (1.778 m)    Body mass index is 41.9 kg/m.  Physical Exam  Constitutional: Patient appears well-developed and well-nourished. No distress.  HENT: Head: Normocephalic and atraumatic. Ears: B TMs ok, no erythema or effusion; Nose: Nose normal. Mouth/Throat: Oropharynx is clear and moist. No oropharyngeal exudate.  Eyes: Conjunctivae and EOM are normal. Pupils are equal, round, and reactive to light. No scleral icterus.  Neck: Normal range of motion. Neck supple. No JVD present. No thyromegaly present.  Cardiovascular: Normal rate, regular rhythm and normal heart sounds.  No murmur heard. No BLE edema. Pulmonary/Chest: Effort normal and breath sounds normal. No respiratory distress. Abdominal: Soft. Bowel sounds are normal, no distension. There is no tenderness. no masses Musculoskeletal: Normal range of motion, no joint effusions. No gross deformities Neurological: he is alert and oriented to person, place, and time. No cranial nerve deficit. Coordination, balance, strength, speech and gait are normal.  Skin: Skin is warm and dry. No rash noted. No erythema.  Psychiatric: Patient has a normal mood and affect. behavior is normal. Judgment and thought content normal.   Recent Results (from the past 2160 hour(s))  Basic metabolic panel     Status: Abnormal   Collection Time: 10/18/22 12:00 AM  Result Value Ref Range   Glucose 136    BUN 18 4 - 21   CO2 29 (A) 13 - 22   Creatinine 1.1 0.6 - 1.3   Potassium 3.9 3.5 - 5.1 mEq/L   Sodium 142 137 - 147   Chloride 103 99 - 108  Comprehensive metabolic panel     Status: None   Collection Time: 10/18/22 12:00 AM  Result Value Ref Range   eGFR 80    Calcium 9.3 8.7 - 10.7  Hemoglobin A1c     Status: None   Collection  Time: 10/18/22 12:00 AM  Result Value Ref Range   Hemoglobin A1C 7.1     Diabetic Foot Exam: Diabetic Foot Exam - Simple   Simple Foot Form Visual Inspection No deformities, no ulcerations, no other skin breakdown bilaterally: Yes Sensation Testing Intact to touch and monofilament testing bilaterally: Yes Pulse Check Posterior Tibialis and Dorsalis pulse intact bilaterally: Yes Comments      PHQ2/9:    11/08/2022    3:01 PM 05/04/2022    1:46 PM 12/14/2021    3:14 PM 09/14/2021   11:45 AM 06/10/2021    2:14 PM  Depression screen PHQ 2/9  Decreased Interest 0 0 0 0 0  Down, Depressed, Hopeless 0 0 0 0 0  PHQ - 2 Score 0 0 0 0 0  Altered sleeping 0 0 0  0  Tired, decreased energy 0 0 0  0  Change in appetite 0 0 0  0  Feeling bad or failure about yourself  0 0 0  0  Trouble concentrating 0 0 0  0  Moving slowly or fidgety/restless 0 0 0  0  Suicidal thoughts 0 0 0  0  PHQ-9 Score 0 0 0  0    phq 9 is negative   Fall Risk:    11/08/2022    3:01 PM 05/04/2022    1:46 PM 12/14/2021    3:14 PM 09/14/2021   11:45 AM 06/10/2021    2:14 PM  Fall Risk   Falls in the past year? 0 0 0 0 0  Number falls in past yr: 0 0 0  0  Injury with Fall? 0 0 0  0  Risk for fall due to : No Fall Risks No Fall Risks No Fall Risks    Follow up Falls prevention discussed Falls prevention discussed Falls prevention discussed        Functional Status Survey: Is the patient deaf or have difficulty hearing?: No Does the patient have difficulty seeing, even when wearing glasses/contacts?: No Does the patient have difficulty concentrating, remembering, or making decisions?: No  Does the patient have difficulty walking or climbing stairs?: No Does the patient have difficulty dressing or bathing?: No Does the patient have difficulty doing errands alone such as visiting a doctor's office or shopping?: No    Assessment & Plan  1. Hypertension associated with type 2 diabetes mellitus  ([place])  - HM Diabetes Foot Exam - CT CARDIAC SCORING; Future  2. Need for immunization against influenza  - Flu Vaccine QUAD 6+ mos PF IM (Fluarix Quad PF)  3. Hypertension goal BP (blood pressure) < 130/80  - Nebivolol HCl 20 MG TABS; Take 1 tablet (20 mg total) by mouth daily. In place of metoprolol  Dispense: 90 tablet; Refill: 1  4. Hyperlipidemia LDL goal <70  - rosuvastatin (CRESTOR) 40 MG tablet; Take 1 tablet (40 mg total) by mouth daily.  Dispense: 90 tablet; Refill: 1  5. Family history of heart disease  - CT CARDIAC SCORING; Future  6. OSA on CPAP  Compliant   7. Shift work sleep disorder   8. BPH without urinary obstruction   9. Other male erectile dysfunction  - tadalafil (CIALIS) 20 MG tablet; Take 1 tablet (20 mg total) by mouth every three (3) days as needed for erectile dysfunction.  Dispense: 90 tablet; Refill: 0  10. Morbid obesity ([place])  - CT CARDIAC SCORING; Future .

## 2022-11-08 ENCOUNTER — Encounter: Payer: Self-pay | Admitting: Family Medicine

## 2022-11-08 ENCOUNTER — Ambulatory Visit: Payer: BC Managed Care – PPO | Admitting: Family Medicine

## 2022-11-08 VITALS — BP 128/72 | HR 82 | Resp 16 | Ht 70.0 in | Wt 292.0 lb

## 2022-11-08 DIAGNOSIS — E1159 Type 2 diabetes mellitus with other circulatory complications: Secondary | ICD-10-CM

## 2022-11-08 DIAGNOSIS — Z23 Encounter for immunization: Secondary | ICD-10-CM

## 2022-11-08 DIAGNOSIS — E785 Hyperlipidemia, unspecified: Secondary | ICD-10-CM

## 2022-11-08 DIAGNOSIS — Z8249 Family history of ischemic heart disease and other diseases of the circulatory system: Secondary | ICD-10-CM

## 2022-11-08 DIAGNOSIS — G4733 Obstructive sleep apnea (adult) (pediatric): Secondary | ICD-10-CM

## 2022-11-08 DIAGNOSIS — I10 Essential (primary) hypertension: Secondary | ICD-10-CM | POA: Diagnosis not present

## 2022-11-08 DIAGNOSIS — N528 Other male erectile dysfunction: Secondary | ICD-10-CM

## 2022-11-08 DIAGNOSIS — I152 Hypertension secondary to endocrine disorders: Secondary | ICD-10-CM

## 2022-11-08 DIAGNOSIS — N400 Enlarged prostate without lower urinary tract symptoms: Secondary | ICD-10-CM

## 2022-11-08 DIAGNOSIS — G4726 Circadian rhythm sleep disorder, shift work type: Secondary | ICD-10-CM

## 2022-11-08 MED ORDER — ROSUVASTATIN CALCIUM 40 MG PO TABS: 40.0000 mg | ORAL_TABLET | Freq: Every day | ORAL | 1 refills | Status: DC

## 2022-11-08 MED ORDER — TADALAFIL 20 MG PO TABS: 20.0000 mg | ORAL_TABLET | ORAL | 0 refills | Status: DC | PRN

## 2022-11-08 MED ORDER — NEBIVOLOL HCL 20 MG PO TABS: 20.0000 mg | ORAL_TABLET | Freq: Every day | ORAL | 1 refills | Status: DC

## 2022-11-26 ENCOUNTER — Encounter (INDEPENDENT_AMBULATORY_CARE_PROVIDER_SITE_OTHER): Payer: BC Managed Care – PPO | Admitting: Ophthalmology

## 2022-11-26 DIAGNOSIS — E113393 Type 2 diabetes mellitus with moderate nonproliferative diabetic retinopathy without macular edema, bilateral: Secondary | ICD-10-CM | POA: Diagnosis not present

## 2022-11-26 DIAGNOSIS — I10 Essential (primary) hypertension: Secondary | ICD-10-CM

## 2022-11-26 DIAGNOSIS — H35033 Hypertensive retinopathy, bilateral: Secondary | ICD-10-CM | POA: Diagnosis not present

## 2022-11-26 DIAGNOSIS — H43813 Vitreous degeneration, bilateral: Secondary | ICD-10-CM | POA: Diagnosis not present

## 2022-12-14 ENCOUNTER — Ambulatory Visit
Admission: RE | Admit: 2022-12-14 | Discharge: 2022-12-14 | Disposition: A | Discharge status: Home / Self Care | Payer: BC Managed Care – PPO | Source: Ambulatory Visit | Attending: Family Medicine | Admitting: Family Medicine

## 2022-12-14 DIAGNOSIS — Z8249 Family history of ischemic heart disease and other diseases of the circulatory system: Secondary | ICD-10-CM | POA: Insufficient documentation

## 2022-12-14 DIAGNOSIS — I152 Hypertension secondary to endocrine disorders: Secondary | ICD-10-CM | POA: Insufficient documentation

## 2022-12-14 DIAGNOSIS — E1159 Type 2 diabetes mellitus with other circulatory complications: Secondary | ICD-10-CM | POA: Insufficient documentation

## 2022-12-15 ENCOUNTER — Other Ambulatory Visit: Payer: Self-pay

## 2022-12-15 DIAGNOSIS — I152 Hypertension secondary to endocrine disorders: Secondary | ICD-10-CM

## 2022-12-15 DIAGNOSIS — E669 Obesity, unspecified: Secondary | ICD-10-CM

## 2022-12-27 ENCOUNTER — Other Ambulatory Visit: Payer: Self-pay | Admitting: Family Medicine

## 2022-12-27 DIAGNOSIS — I10 Essential (primary) hypertension: Secondary | ICD-10-CM

## 2023-01-05 ENCOUNTER — Ambulatory Visit: Payer: BC Managed Care – PPO | Admitting: Physician Assistant

## 2023-01-05 ENCOUNTER — Encounter: Payer: Self-pay | Admitting: Physician Assistant

## 2023-01-05 NOTE — Progress Notes (Deleted)
Cardiology Office Note    Date:  01/05/2023   ID:  Clarence Black, DOB 04-20-1966, MRN [id number]  PCP:  Clarence Sizer, MD  Cardiologist:  Clarence Rogue, MD  Electrophysiologist:  None   Chief Complaint: Abnormal calcium score  History of Present Illness:   Clarence Black is a 57 y.o. male with history of coronary artery calcification noted on CT imaging, DM2 with retinopathy, HTN, HLD, obesity with OSA, family history of premature CAD with a brother passing from an MI at age 76, BPH, and anxiety who presents for evaluation of coronary artery calcification.   He was initially and last seen in the office in 04/2020 for evaluation of abnormal EKG with concern of inferior changes, felt to be related to lead placement.  At that time, he was without symptoms of angina or decompensation.  No further testing was pursued.   More recently, he underwent calcium score by PCP in 11/2022, which showed a score of 334 which was the 94th percentile.  Coronary calcium was noted in the LAD and RCA.  In this setting, cardiology follow-up was recommended.  ***   Labs independently reviewed: 09/2022 - A1c 7.1, potassium 3.9, BUN 18, SCr 1.1 04/2022 - albumin 4.5, AST/ALT normal 01/2022 - TC 108, TG 163, HDL 34, LDL 41 05/2018 - Hgb 15.7, PLT 176, TSH normal  Past Medical History:  Diagnosis Date   CAD (coronary artery disease)    a. calcium score 334 in 12/23 involving the LAD & RCA   Diabetic retinopathy ([place])    Diastasis recti 12/31/2016   Erectile dysfunction associated with type 2 diabetes mellitus ([place]) 02/20/2015   Family history of premature CAD    a. 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   Hyperlipidemia LDL goal <70 06/13/2015   Hypertension    Hypogonadism, male    Obesity, Class III, BMI 40-49.9 (morbid obesity) ([place]) 02/20/2015   Sleep apnea 09/29/2018   Uncontrolled type 2 diabetes mellitus with peripheral circulatory disorder 06/13/2015    Past  Surgical History:  Procedure Laterality Date   COLONOSCOPY  2013   COLONOSCOPY WITH PROPOFOL N/A 06/02/2018   Procedure: COLONOSCOPY WITH PROPOFOL;  Surgeon: Clarence Bellows, MD;  [facility] END[facility]COPY;  Service: Gastroenterology;  Laterality: N/A;    Current Medications: No outpatient medications have been marked as taking for the 01/05/23 encounter (Appointment) with Clarence Mu, PA-C.    Allergies:   Patient has no known allergies.   Social History   Socioeconomic History   Marital status: Married    Spouse name: Clarence Black    Number of children: 3   Years of education: Not on file   Highest education level: Not on file  Occupational History   Occupation: driver   Tobacco Use   Smoking status: Never   Smokeless tobacco: Never  Vaping Use   Vaping Use: Never used  Substance and Sexual Activity   Alcohol use: No    Alcohol/week: 0.0 standard drinks of alcohol   Drug use: No   Sexual activity: Yes    Partners: Female  Other Topics Concern   Not on file  Social History Narrative   Not on file   Social Determinants of Health   Financial Resource Strain: Low Risk  (05/07/2022)   Overall Financial Resource Strain (CARDIA)    Difficulty of Paying Living Expenses: Not hard at all  Food Insecurity: No Food Insecurity (05/07/2022)   Hunger Vital Sign  Worried About Charity fundraiser in the Last Year: Never true    [place] in the Last Year: Never true  Transportation Needs: No Transportation Needs (05/07/2022)   PRAPARE - Hydrologist (Medical): No    Lack of Transportation (Non-Medical): No  Physical Activity: Insufficiently Active (05/07/2022)   Exercise Vital Sign    Days of Exercise per Week: 3 days    Minutes of Exercise per Session: 30 min  Stress: No Stress Concern Present (05/07/2022)   [place]    Feeling of Stress : Only a little  Social Connections: Socially  Integrated (05/07/2022)   Social Connection and Isolation Panel [NHANES]    Frequency of Communication with Friends and Family: More than three times a week    Frequency of Social Gatherings with Friends and Family: Three times a week    Attends Religious Services: More than 4 times per year    Active Member of Clubs or Organizations: Yes    Attends Music therapist: More than 4 times per year    Marital Status: Married     Family History:  The patient's family history includes Diabetes in his brother, brother, maternal grandfather, and mother; Hypertension in his maternal grandfather; Kidney disease in his mother; Stroke in his maternal grandfather.  ROS:   ROS   EKGs/Labs/Other Studies Reviewed:    Studies reviewed were summarized above. The additional studies were reviewed today:  Calcium score 12/14/2022: Coronary arteries: Normal origin of left and right coronary arteries. Distribution of arterial calcifications if present, as noted below;   LM 0   LAD 129   LCx 0   RCA 205   Total 334   IMPRESSION AND RECOMMENDATION: 1. Coronary calcium score of 334. This was 94th percentile for age and sex matched control. 2. CAC >300 in LAD, RCA. CAC-DRS A3/N2. 3. Recommend aspirin and statin if no contraindication. 4. Consider cardiology consultation. 5. Continue heart healthy lifestyle and risk factor modification.   EKG:  EKG is ordered today.  The EKG ordered today demonstrates ***  Recent Labs: 05/04/2022: ALT 21 10/18/2022: BUN 18; Creatinine 1.1; Potassium 3.9; Sodium 142  Recent Lipid Panel    Component Value Date/Time   CHOL 112 03/09/2021 1020   CHOL 205 (H) 12/16/2015 1038   TRIG 114 03/09/2021 1020   HDL 39 (L) 03/09/2021 1020   HDL 36 (L) 12/16/2015 1038   CHOLHDL 2.9 03/09/2021 1020   VLDL 26 10/22/2016 1716   LDLCALC 53 03/09/2021 1020    PHYSICAL EXAM:    VS:  There were no vitals taken for this visit.  BMI: There is no height or  weight on file to calculate BMI.  Physical Exam  Wt Readings from Last 3 Encounters:  11/08/22 292 lb (132.5 kg)  05/07/22 290 lb (131.5 kg)  05/04/22 290 lb (131.5 kg)     ASSESSMENT & PLAN:   Coronary calcification with family history of premature CAD: ***.  Recommend aggressive risk factor modification.  HTN: Blood pressure ***  HLD: LDL 41 with a triglyceride of 163 in 01/2022, normal AST/ALT in 04/2022.   DM2: A1c 7.1.  Followed by endocrinology.  Obesity with OSA:   {Are you ordering a CV Procedure (e.g. stress test, cath, DCCV, TEE, etc)?   Press F2        :[id number]     Disposition: F/u with Dr. Rockey Situ or an APP  in ***.   Medication Adjustments/Labs and Tests Ordered: Current medicines are reviewed at length with the patient today.  Concerns regarding medicines are outlined above. Medication changes, Labs and Tests ordered today are summarized above and listed in the Patient Instructions accessible in Encounters.   Signed, Christell Faith, PA-C 01/05/2023 12:11 PM     [facility] [address] [place], [place] [address] [phone]

## 2023-01-06 ENCOUNTER — Encounter: Payer: Self-pay | Admitting: Nurse Practitioner

## 2023-01-06 ENCOUNTER — Ambulatory Visit: Payer: BC Managed Care – PPO | Attending: Physician Assistant | Admitting: Nurse Practitioner

## 2023-01-06 VITALS — BP 136/86 | HR 75 | Ht 70.0 in | Wt 293.6 lb

## 2023-01-06 DIAGNOSIS — I2510 Atherosclerotic heart disease of native coronary artery without angina pectoris: Secondary | ICD-10-CM

## 2023-01-06 DIAGNOSIS — E66813 Obesity, class 3: Secondary | ICD-10-CM

## 2023-01-06 DIAGNOSIS — I2584 Coronary atherosclerosis due to calcified coronary lesion: Secondary | ICD-10-CM

## 2023-01-06 DIAGNOSIS — E1159 Type 2 diabetes mellitus with other circulatory complications: Secondary | ICD-10-CM

## 2023-01-06 DIAGNOSIS — G4733 Obstructive sleep apnea (adult) (pediatric): Secondary | ICD-10-CM

## 2023-01-06 DIAGNOSIS — I152 Hypertension secondary to endocrine disorders: Secondary | ICD-10-CM

## 2023-01-06 DIAGNOSIS — E669 Obesity, unspecified: Secondary | ICD-10-CM

## 2023-01-06 DIAGNOSIS — E1169 Type 2 diabetes mellitus with other specified complication: Secondary | ICD-10-CM

## 2023-01-06 DIAGNOSIS — E119 Type 2 diabetes mellitus without complications: Secondary | ICD-10-CM

## 2023-01-06 DIAGNOSIS — E785 Hyperlipidemia, unspecified: Secondary | ICD-10-CM

## 2023-01-06 NOTE — Patient Instructions (Addendum)
Medication Instructions:  No changes at this time.   *If you need a refill on your cardiac medications before your next appointment, please call your pharmacy*   Lab Work: None  If you have labs (blood work) drawn today and your tests are completely normal, you will receive your results only by: MyChart Message (if you have MyChart) OR A paper copy in the mail If you have any lab test that is abnormal or we need to change your treatment, we will call you to review the results.   Testing/Procedures: None   Follow-Up: At [facility], you and your health needs are our priority.  As part of our continuing mission to provide you with exceptional heart care, we have created designated Provider Care Teams.  These Care Teams include your primary Cardiologist (physician) and Advanced Practice Providers (APPs -  Physician Assistants and Nurse Practitioners) who all work together to provide you with the care you need, when you need it.  Your next appointment:   3 month(s)  Provider:   Timothy Gollan, MD or Christopher Berge, NP     

## 2023-01-06 NOTE — Progress Notes (Signed)
Office Visit    Patient Name: Clarence Black Date of Encounter: 01/06/2023  Primary Care Provider:  Steele Sizer, MD Primary Cardiologist:  Ida Rogue, MD  Chief Complaint    57 y/o ? w/ a h/o HTN, DMII, HL, obesity, OSA, and ED, who presents for f/u after recent chest CT showing elevated cor Ca2+.  Past Medical History    Past Medical History:  Diagnosis Date   CAD (coronary artery disease)    a. calcium score 334 in 12/23 involving the LAD & RCA. 94th %'ile.   Diabetic retinopathy ([place])    Diastasis recti 12/31/2016   Erectile dysfunction associated with type 2 diabetes mellitus ([place]) 02/20/2015   Family history of premature CAD    a. 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   Hyperlipidemia LDL goal <70 06/13/2015   Hypertension    Hypogonadism, male    Obesity, Class III, BMI 40-49.9 (morbid obesity) ([place]) 02/20/2015   Sleep apnea 09/29/2018   Uncontrolled type 2 diabetes mellitus with peripheral circulatory disorder 06/13/2015   Past Surgical History:  Procedure Laterality Date   COLONOSCOPY  2013   COLONOSCOPY WITH PROPOFOL N/A 06/02/2018   Procedure: COLONOSCOPY WITH PROPOFOL;  Surgeon: Jonathon Bellows, MD;  [facility] END[facility]COPY;  Service: Gastroenterology;  Laterality: N/A;    Allergies  No Known Allergies  History of Present Illness    57 year old male with a history of hypertension, diabetes, hyperlipidemia, obesity, sleep apnea, and erectile dysfunction.  He was previously evaluated by Dr. Rockey Situ in May 2021 secondary to abnormal EKG which was felt to be secondary to lead placement.  He has since been followed by primary care.  In the setting of ongoing risk factors for coronary artery disease, he recently underwent coronary calcium scoring in late December, showing a score of 334 predominantly involving the LAD and RCA, placing him in the 94th percentile for age/sex.  He was subsequently referred back to cardiology.  Mr.  Berkovich presents with his significant other today.  He works as a Administrator.  He does not routinely exercise.  He does not pay particular attention to his diet either.  Fortunately, he does not experience chest pain or dyspnea.  He has to walk about a quarter of a mile into work every morning and is able to do this without symptoms or limitations.  He is interested in beginning an exercise program and we discussed this today as well as other lifestyle modifications from which he would benefit.  He denies PND, orthopnea, dizziness, syncope, edema, or early satiety.  He is compliant with CPAP.  Home Medications    Current Outpatient Medications  Medication Sig Dispense Refill   brimonidine (ALPHAGAN) 0.15 % ophthalmic solution Place 1 drop into the right eye daily.     busPIRone (BUSPAR) 5 MG tablet TAKE 1 TABLET BY MOUTH TWICE A DAY AS NEEDED 60 tablet 0   Continuous Blood Gluc Sensor (FREESTYLE LIBRE 3 SENSOR) MISC USE 1 EACH EVERY 14 (FOURTEEN) DAYS     Dapagliflozin-metFORMIN HCl ER (XIGDUO XR) 04-999 MG TB24 Take 2 tablets by mouth daily.     glipiZIDE (GLUCOTROL XL) 10 MG 24 hr tablet Take 1 tablet by mouth daily.     hydrALAZINE (APRESOLINE) 10 MG tablet TAKE 1 TABLET (10 MG TOTAL) BY MOUTH DAILY AS NEEDED WHEN BLOOD PRESSURE ABOVE 145/90 90 tablet 0   modafinil (PROVIGIL) 200 MG tablet TAKE 1 TABLET BY MOUTH DAILY 90  tablet 0   Nebivolol HCl 20 MG TABS Take 1 tablet (20 mg total) by mouth daily. In place of metoprolol 90 tablet 1   olmesartan-hydrochlorothiazide (BENICAR HCT) 40-25 MG tablet TAKE 1 TABLET BY MOUTH EVERY DAY 90 tablet 1   ONETOUCH DELICA LANCETS FINE MISC 1 Device by Does not apply route 3 (three) times daily. 300 each 3   rosuvastatin (CRESTOR) 40 MG tablet Take 1 tablet (40 mg total) by mouth daily. 90 tablet 1   tadalafil (CIALIS) 20 MG tablet Take 1 tablet (20 mg total) by mouth every three (3) days as needed for erectile dysfunction. 90 tablet 0   TRULICITY 4.5  YC/1.4GY SOPN Inject 4.5 mg into the skin once a week.     Azelastine HCl 137 MCG/SPRAY SOLN PLACE 2 SPRAYS INTO BOTH NOSTRILS 2 (TWO) TIMES DAILY. USE IN EACH NOSTRIL AS DIRECTED (Patient not taking: Reported on 01/06/2023) 30 mL 1   BESIVANCE 0.6 % SUSP INSTILL ONE DROP INTO BOTH EYES 4 TIMES A DAY FOR 2 DAYS AFTER EACH MONTHLY EYE INJECTION (Patient not taking: Reported on 01/06/2023)     glucose blood (ONE TOUCH ULTRA TEST) test strip Use as instructed (Patient not taking: Reported on 01/06/2023) 300 each 3   Insulin Pen Needle (B-D UF III MINI PEN NEEDLES) 31G X 5 MM MISC FOR USE WITH INJECTABLE MEDICINE, ONCE A DAY (Patient not taking: Reported on 01/06/2023) 90 each 3   No current facility-administered medications for this visit.     Review of Systems    He denies chest pain, palpitations, dyspnea, pnd, orthopnea, n, v, dizziness, syncope, edema, weight gain, or early satiety.  All other systems reviewed and are otherwise negative except as noted above.    Physical Exam    VS:  BP 136/86 (BP Location: Left Arm, Patient Position: Sitting, Cuff Size: Large)   Pulse 75   Ht '5\' 10"'$  (1.778 m)   Wt 293 lb 9.6 oz (133.2 kg)   SpO2 99%   BMI 42.13 kg/m  , BMI Body mass index is 42.13 kg/m.     GEN: Obese,, in no acute distress. HEENT: normal. Neck: Supple, obese, difficult to gauge JVP.  No bruits or masses.  Cardiac: RRR, no murmurs, rubs, or gallops. No clubbing, cyanosis, edema.  Radials 2+/PT 2+ and equal bilaterally.  Respiratory:  Respirations regular and unlabored, clear to auscultation bilaterally. GI: Soft, nontender, nondistended, BS + x 4. MS: no deformity or atrophy. Skin: warm and dry, no rash. Neuro:  Strength and sensation are intact. Psych: Normal affect.  Accessory Clinical Findings    ECG personally reviewed by me today -regular sinus rhythm, nonspecific T changes- no acute changes.  Lab Results  Component Value Date   WBC 7.7 06/23/2018   HGB 15.7 06/23/2018    HCT 46.5 06/23/2018   MCV 83.9 06/23/2018   PLT 176 06/23/2018   Lab Results  Component Value Date   CREATININE 1.1 10/18/2022   BUN 18 10/18/2022   NA 142 10/18/2022   K 3.9 10/18/2022   CL 103 10/18/2022   CO2 29 (A) 10/18/2022   Lab Results  Component Value Date   ALT 21 05/04/2022   AST 22 05/04/2022   ALKPHOS 66 10/22/2016   BILITOT 0.7 05/04/2022   Lab Results  Component Value Date   CHOL 112 03/09/2021   HDL 39 (L) 03/09/2021   LDLCALC 53 03/09/2021   TRIG 114 03/09/2021   CHOLHDL 2.9 03/09/2021    Lab  Results  Component Value Date   HGBA1C 7.1 10/18/2022    Assessment & Plan    1.  Calcification of coronary arteries: In the setting of risk factors of coronary artery disease including family history, hypertension, diabetes, hyperlipidemia, and obesity patient underwent cardiac CT for calcium scoring which revealed an elevated calcium of 334, placing him in the 94th percentile for age/sex.  We discussed these findings today and the potential risk that they entail.  Fortunately, he does not experience chest pain or dyspnea, though his activity is fairly limited.  He reports that his mother developed heart failure in her mid 38s.  He is interested in starting an exercise program.  I calculated his 10-year risk of a cardiac event based on the Framingham calculator, and it came out to 19.3%.  I did offer an exercise Myoview for risk stratification prior to starting his exercise program however, he wishes to defer at this time.  We agreed to follow-up in approximately 3 months to reevaluate his progress and symptoms and whether or not testing is appropriate.  2.  Essential hypertension: Reasonably controlled on beta-blocker, ARB, diuretic therapy.  3.  Hyperlipidemia: LDL of 53 in March 2022.  He remains on rosuvastatin therapy.  He will plan to have follow-up lipids at annual physical with his primary care provider.  4.  Type 2 diabetes mellitus: Followed closely by primary  care.  A1c 7.1.  5.  Morbid obesity: BMI of 42.13.  Patient is not currently exercising and does not pay particular attention to his caloric intake.  We discussed the importance of lifestyle modifications, at least 30 minutes of aerobic activity/moderate exercise daily, and consideration for a whole food plant-based diet.  Lifestyle medicine information provided.  If not otherwise contraindicated, patient would likely benefit from use of a GLP-1 agonist-defer to his primary care provider.  6.  Obstructive sleep apnea: Compliant with CPAP.  7.  Disposition:  45 mins spent w/ pt and wife today.  Patient will follow-up in 3 months or sooner if necessary.  Murray Hodgkins, NP 01/06/2023, 4:07 PM

## 2023-01-26 ENCOUNTER — Other Ambulatory Visit: Payer: Self-pay | Admitting: Family Medicine

## 2023-01-26 DIAGNOSIS — G4726 Circadian rhythm sleep disorder, shift work type: Secondary | ICD-10-CM

## 2023-01-26 DIAGNOSIS — G4733 Obstructive sleep apnea (adult) (pediatric): Secondary | ICD-10-CM

## 2023-01-26 DIAGNOSIS — I10 Essential (primary) hypertension: Secondary | ICD-10-CM

## 2023-02-04 ENCOUNTER — Encounter (INDEPENDENT_AMBULATORY_CARE_PROVIDER_SITE_OTHER): Payer: BC Managed Care – PPO | Admitting: Ophthalmology

## 2023-02-04 DIAGNOSIS — H35033 Hypertensive retinopathy, bilateral: Secondary | ICD-10-CM | POA: Diagnosis not present

## 2023-02-04 DIAGNOSIS — H43813 Vitreous degeneration, bilateral: Secondary | ICD-10-CM

## 2023-02-04 DIAGNOSIS — E113392 Type 2 diabetes mellitus with moderate nonproliferative diabetic retinopathy without macular edema, left eye: Secondary | ICD-10-CM | POA: Diagnosis not present

## 2023-02-04 DIAGNOSIS — I10 Essential (primary) hypertension: Secondary | ICD-10-CM

## 2023-02-04 DIAGNOSIS — E113291 Type 2 diabetes mellitus with mild nonproliferative diabetic retinopathy without macular edema, right eye: Secondary | ICD-10-CM | POA: Diagnosis not present

## 2023-02-14 ENCOUNTER — Ambulatory Visit: Payer: BC Managed Care – PPO | Admitting: Family Medicine

## 2023-02-14 ENCOUNTER — Encounter: Payer: Self-pay | Admitting: Family Medicine

## 2023-02-14 VITALS — BP 144/82 | HR 90 | Temp 98.1°F | Resp 16 | Ht 70.0 in | Wt 288.7 lb

## 2023-02-14 DIAGNOSIS — H9203 Otalgia, bilateral: Secondary | ICD-10-CM | POA: Diagnosis not present

## 2023-02-14 DIAGNOSIS — I10 Essential (primary) hypertension: Secondary | ICD-10-CM

## 2023-02-14 MED ORDER — LEVOCETIRIZINE DIHYDROCHLORIDE 5 MG PO TABS: 5.0000 mg | ORAL_TABLET | Freq: Every evening | ORAL | 1 refills | Status: DC

## 2023-02-14 NOTE — Patient Instructions (Addendum)
Sudafed - behind the counter from pharmacist  Start antihistamine daily - zyrtec, claritin, xyzal, allegra  And do you steroid nasal spray - 2 sprays each nostril once daily    Eustachian Tube Dysfunction  Eustachian tube dysfunction refers to a condition in which a blockage develops in the narrow passage that connects the middle ear to the back of the nose (eustachian tube). The eustachian tube regulates air pressure in the middle ear by letting air move between the ear and nose. It also helps to drain fluid from the middle ear space. Eustachian tube dysfunction can affect one or both ears. When the eustachian tube does not function properly, air pressure, fluid, or both can build up in the middle ear. What are the causes? This condition occurs when the eustachian tube becomes blocked or cannot open normally. Common causes of this condition include: Ear infections. Colds and other infections that affect the nose, mouth, and throat (upper respiratory tract). Allergies. Irritation from cigarette smoke. Irritation from stomach acid coming up into the esophagus (gastroesophageal reflux). The esophagus is the part of the body that moves food from the mouth to the stomach. Sudden changes in air pressure, such as from descending in an airplane or scuba diving. Abnormal growths in the nose or throat, such as: Growths that line the nose (nasal polyps). Abnormal growth of cells (tumors). Enlarged tissue at the back of the throat (adenoids). What increases the risk? You are more likely to develop this condition if: You smoke. You are overweight. You are a child who has: Certain birth defects of the mouth, such as cleft palate. Large tonsils or adenoids. What are the signs or symptoms? Common symptoms of this condition include: A feeling of fullness in the ear. Ear pain. Clicking or popping noises in the ear. Ringing in the ear (tinnitus). Hearing loss. Loss of balance. Dizziness. Symptoms  may get worse when the air pressure around you changes, such as when you travel to an area of high elevation, fly on an airplane, or go scuba diving. How is this diagnosed? This condition may be diagnosed based on: Your symptoms. A physical exam of your ears, nose, and throat. Tests, such as those that measure: The movement of your eardrum. Your hearing (audiometry). How is this treated? Treatment depends on the cause and severity of your condition. In mild cases, you may relieve your symptoms by moving air into your ears. This is called "popping the ears." In more severe cases, or if you have symptoms of fluid in your ears, treatment may include: Medicines to relieve congestion (decongestants). Medicines that treat allergies (antihistamines). Nasal sprays or ear drops that contain medicines that reduce swelling (steroids). A procedure to drain the fluid in your eardrum. In this procedure, a small tube may be placed in the eardrum to: Drain the fluid. Restore the air in the middle ear space. A procedure to insert a balloon device through the nose to inflate the opening of the eustachian tube (balloon dilation). Follow these instructions at home: Lifestyle Do not do any of the following until your health care provider approves: Travel to high altitudes. Fly in airplanes. Work in a Pension scheme manager or room. Scuba dive. Do not use any products that contain nicotine or tobacco. These products include cigarettes, chewing tobacco, and vaping devices, such as e-cigarettes. If you need help quitting, ask your health care provider. Keep your ears dry. Wear fitted earplugs during showering and bathing. Dry your ears completely after. General instructions Take over-the-counter and  prescription medicines only as told by your health care provider. Use techniques to help pop your ears as recommended by your health care provider. These may include: Chewing gum. Yawning. Frequent, forceful  swallowing. Closing your mouth, holding your nose closed, and gently blowing as if you are trying to blow air out of your nose. Keep all follow-up visits. This is important. Contact a health care provider if: Your symptoms do not go away after treatment. Your symptoms come back after treatment. You are unable to pop your ears. You have: A fever. Pain in your ear. Pain in your head or neck. Fluid draining from your ear. Your hearing suddenly changes. You become very dizzy. You lose your balance. Get help right away if: You have a sudden, severe increase in any of your symptoms. Summary Eustachian tube dysfunction refers to a condition in which a blockage develops in the eustachian tube. It can be caused by ear infections, allergies, inhaled irritants, or abnormal growths in the nose or throat. Symptoms may include ear pain or fullness, hearing loss, or ringing in the ears. Mild cases are treated with techniques to unblock the ears, such as yawning or chewing gum. More severe cases are treated with medicines or procedures. This information is not intended to replace advice given to you by your health care provider. Make sure you discuss any questions you have with your health care provider. Document Revised: 02/23/2021 Document Reviewed: 02/23/2021 Elsevier Patient Education  [place].

## 2023-02-14 NOTE — Progress Notes (Signed)
Patient ID: Clarence Black, male    DOB: 04/14/1966, 57 y.o.   MRN: [id number]  PCP: Steele Sizer, MD  Chief Complaint  Patient presents with   Ear Fullness    Pt states had cold sx for a while feels his ears full    Subjective:   Clarence Black is a 57 y.o. male, presents to clinic with CC of the following:  HPI    Patient Active Problem List   Diagnosis Date Noted   Hypertension associated with type 2 diabetes mellitus ([place]) 06/10/2021   Dyslipidemia due to type 2 diabetes mellitus (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 USE IN EACH NOSTRIL AS DIRECTED, Disp: 30 mL, Rfl: 1   BESIVANCE 0.6 % SUSP, , Disp: , Rfl:    brimonidine (ALPHAGAN) 0.15 % ophthalmic solution, Place 1 drop into the right eye daily., Disp: , Rfl:    busPIRone (BUSPAR) 5 MG tablet, TAKE 1 TABLET BY MOUTH TWICE A DAY AS NEEDED, Disp: 60 tablet, Rfl: 0   Continuous Blood Gluc Sensor (FREESTYLE LIBRE 3 SENSOR) MISC, USE 1 EACH EVERY 14 (FOURTEEN) DAYS, Disp: , Rfl:    Dapagliflozin-metFORMIN HCl ER (XIGDUO XR) 04-999 MG TB24, Take 2 tablets by mouth daily., Disp: , Rfl:    glipiZIDE (GLUCOTROL XL) 10 MG 24 hr tablet, Take 1 tablet by mouth daily., Disp: , Rfl:    glucose blood (ONE TOUCH ULTRA TEST) test strip, Use as instructed,  Disp: 300 each, Rfl: 3   hydrALAZINE (APRESOLINE) 10 MG tablet, TAKE 1 TABLET (10 MG TOTAL) BY MOUTH DAILY AS NEEDED WHEN BLOOD PRESSURE ABOVE 145/90, Disp: 90 tablet, Rfl: 0   Insulin Pen Needle (B-D UF III MINI PEN NEEDLES) 31G X 5 MM MISC, FOR USE WITH INJECTABLE MEDICINE, ONCE A DAY, Disp: 90 each, Rfl: 3   modafinil (PROVIGIL) 200 MG tablet, TAKE 1 TABLET BY MOUTH EVERY DAY, Disp: 90 tablet, Rfl: 0   Nebivolol HCl 20 MG TABS, Take 1 tablet (20 mg total) by mouth daily. In place of metoprolol, Disp: 90 tablet, Rfl: 1   olmesartan-hydrochlorothiazide (BENICAR HCT) 40-25 MG tablet, TAKE 1 TABLET BY MOUTH EVERY DAY, Disp: 90 tablet, Rfl: 1   ONETOUCH DELICA LANCETS FINE MISC, 1 Device by Does not apply route 3 (three) times daily., Disp: 300 each, Rfl: 3   rosuvastatin (CRESTOR) 40 MG tablet, Take 1 tablet (40 mg total) by mouth daily., Disp: 90 tablet, Rfl: 1   tadalafil (CIALIS) 20 MG tablet, Take 1 tablet (20 mg total) by mouth every three (3) days as needed for erectile dysfunction., Disp: 90 tablet, Rfl: 0   TRULICITY 4.5 0000000 SOPN, Inject 4.5 mg into the skin once a week., Disp: , Rfl:    No Known Allergies   Social History   Tobacco Use  Smoking status: Never   Smokeless tobacco: Never  Vaping Use   Vaping Use: Never used  Substance Use Topics   Alcohol use: No    Alcohol/week: 0.0 standard drinks of alcohol   Drug use: No      Chart Review Today: I personally reviewed active problem list, medication list, allergies, family history, social history, health maintenance, notes from last encounter, lab results, imaging with the patient/caregiver today.   Review of Systems  Constitutional: Negative.   HENT: Negative.    Eyes: Negative.   Respiratory: Negative.    Cardiovascular: Negative.   Gastrointestinal: Negative.   Endocrine: Negative.   Genitourinary: Negative.   Musculoskeletal: Negative.   Skin: Negative.   Allergic/Immunologic: Negative.   Neurological:  Negative.   Hematological: Negative.   Psychiatric/Behavioral: Negative.    All other systems reviewed and are negative.      Objective:   Vitals:   02/14/23 1308 02/14/23 1353  BP: (!) 160/92 (!) 144/82  Pulse: 90   Resp: 16   Temp: 98.1 F (36.7 C)   TempSrc: Oral   SpO2: 96%   Weight: 288 lb 11.2 oz (131 kg)   Height: 5' 10"$  (1.778 m)     Body mass index is 41.42 kg/m.  Physical Exam Vitals and nursing note reviewed.  Constitutional:      General: He is not in acute distress.    Appearance: Normal appearance. He is well-developed. He is obese. He is not ill-appearing, toxic-appearing or diaphoretic.  HENT:     Head: Normocephalic and atraumatic.     Right Ear: Tympanic membrane, ear canal and external ear normal. No drainage, swelling or tenderness. No middle ear effusion. There is no impacted cerumen. No mastoid tenderness.     Left Ear: Tympanic membrane, ear canal and external ear normal. No drainage, swelling or tenderness.  No middle ear effusion. There is no impacted cerumen. No mastoid tenderness.     Ears:     Comments: Mildly injected canal and TM B/L otherwise TM b/l translucent, visible landmarks and cone of light    Nose: Mucosal edema and congestion present.     Right Turbinates: Swollen.     Left Turbinates: Swollen.     Right Sinus: No maxillary sinus tenderness or frontal sinus tenderness.     Left Sinus: No maxillary sinus tenderness or frontal sinus tenderness.     Mouth/Throat:     Mouth: Mucous membranes are moist.     Pharynx: Oropharynx is clear. Uvula midline. Posterior oropharyngeal erythema present. No pharyngeal swelling, oropharyngeal exudate or uvula swelling.     Tonsils: No tonsillar exudate.  Eyes:     General: No scleral icterus.       Right eye: No discharge.        Left eye: No discharge.     Conjunctiva/sclera: Conjunctivae normal.  Neck:     Trachea: No tracheal deviation.  Cardiovascular:     Rate and Rhythm: Normal rate and  regular rhythm.  Pulmonary:     Effort: Pulmonary effort is normal. No respiratory distress.     Breath sounds: No stridor.  Musculoskeletal:        General: Normal range of motion.     Cervical back: Normal range of motion.  Lymphadenopathy:     Head:     Right side of head: No submental, submandibular, tonsillar, preauricular or posterior auricular adenopathy.     Left side of head: No submandibular, tonsillar, preauricular or posterior auricular adenopathy.  Cervical: No cervical adenopathy.  Skin:    General: Skin is warm and dry.     Findings: No rash.  Neurological:     Mental Status: He is alert.     Motor: No abnormal muscle tone.     Coordination: Coordination normal.  Psychiatric:        Behavior: Behavior normal.      Results for orders placed or performed in visit on 11/05/22  Microalbumin / creatinine urine ratio  Result Value Ref Range   Microalb Creat Ratio 34.3   Protein / creatinine ratio, urine  Result Value Ref Range   Creatinine, Urine 78.7    Albumin, U 27   Basic metabolic panel  Result Value Ref Range   Glucose 136    BUN 18 4 - 21   CO2 29 (A) 13 - 22   Creatinine 1.1 0.6 - 1.3   Potassium 3.9 3.5 - 5.1 mEq/L   Sodium 142 137 - 147   Chloride 103 99 - 108  Comprehensive metabolic panel  Result Value Ref Range   eGFR 80    Calcium 9.3 8.7 - 10.7  Hemoglobin A1c  Result Value Ref Range   Hemoglobin A1C 7.1        Assessment & Plan:   1. Otalgia of both ears Fullness, mild intermittent discomfort and sensation of blocked ears b/l x 2 weeks after URI/viral illness which he has all of sx improving except his ears Only mild injection on TM otherwise normal ear exam Nose and throat have mild edema and erythema Suspect eustachian tube dysfunction Tx with antihistamines, steroid nose sprays and decongestants (discussed his elevated BP today - he was rushing to get here and a little worried/upset with his sx, normally his BP is well  controlled - last OV BP 128/72 - reviewed with him that with high BP he should avoid sudafed - but if BP is back to his normal and well controlled he will likely tolerate a few days or doses of decongestants) With URI/viral illness 2 weeks ago I would expect sx to improve in the next week or two Currently no indication for abx - no AOM, no ABS Encouraged him to f/up if any worsening or if no improvement in 1-2 weeks after trying tx then let us know if he wants ENT consult.  - levocetirizine (XYZAL ALLERGY 24HR) 5 MG tablet; Take 1 tablet (5 mg total) by mouth every evening.  Dispense: 30 tablet; Refill: 1     2. Hypertension goal BP (blood pressure) < 130/80 BP elevated today however baseline pt is well controlled BP Readings from Last 3 Encounters:  02/14/23 (!) 144/82  01/06/23 136/86  11/08/22 128/72  Pt anxious appearing, states he was rushing and he has been a little sick for the past 2 weeks Monitor at home, recommend not taking sudafed if BP remains above goal   Plan, meds, f/up and suspected dx reviewed with the pt and printed on AVS   Delsa Grana, PA-C 02/14/23 1:25 PM

## 2023-02-17 LAB — LIPID PANEL
Cholesterol: 106 (ref 0–200)
HDL: 35 (ref 35–70)
LDL Cholesterol: 45
Triglycerides: 132 (ref 40–160)

## 2023-02-17 LAB — COMPREHENSIVE METABOLIC PANEL
Calcium: 8.9 (ref 8.7–10.7)
eGFR: 101

## 2023-02-17 LAB — HEMOGLOBIN A1C: Hemoglobin A1C: 7.5

## 2023-02-17 LAB — BASIC METABOLIC PANEL
BUN: 14 (ref 4–21)
CO2: 28 — AB (ref 13–22)
Chloride: 104 (ref 99–108)
Creatinine: 0.9 (ref 0.6–1.3)
Glucose: 136
Potassium: 3.8 mEq/L (ref 3.5–5.1)
Sodium: 139 (ref 137–147)

## 2023-02-17 LAB — PROTEIN / CREATININE RATIO, URINE
Albumin, U: 55
Creatinine, Urine: 91.7

## 2023-02-17 LAB — MICROALBUMIN / CREATININE URINE RATIO: Microalb Creat Ratio: 60

## 2023-03-17 ENCOUNTER — Other Ambulatory Visit: Payer: Self-pay | Admitting: Family Medicine

## 2023-03-17 DIAGNOSIS — H9203 Otalgia, bilateral: Secondary | ICD-10-CM

## 2023-04-08 ENCOUNTER — Ambulatory Visit: Payer: BC Managed Care – PPO | Admitting: Nurse Practitioner

## 2023-04-08 NOTE — Progress Notes (Unsigned)
Name: Clarence Black   MRN: [id number]    DOB: July 25, 1966   Date:04/11/2023       Progress Note  Subjective  Chief Complaint  Follow Up  HPI  HTN: he has been taking medications , Bystolic, Benicar/hctz and Hydralazine  prn for elevated bp.   Denies chest pain or palpitation. No dizziness.  BP is at goal today   Umbilical hernia: he saw surgeon and was advised to lose weight first Unchanged   DMII: he sees Dr. Tedd Sias, no recent episodes of hypoglycemia. He has retinopathy and sees Dr. Anastasio Auerbach , last A1C was done 01/2023 and was 7.5 % previously was 7.1 %   he had been switched from ozempic to Trulicity 4.5 mg due to [employer] but does not seem to be working as well, Dr. Tedd Sias recently sent another rx of Ozempic    He is on ARB and SGL-2 agonist for microalbuminuria , on statin therapy for hyperlipidemia. Marland Kitchen He denies polyphagia, polydipsia or polyuria. LDL is under control . Up to date with eye exams .   Obesity: BMI above 40, he is a truck driver, reviewed importance of life style modification , he will resume Ozempic this week, weight went up while on Trulicity, also had some knee pain after stepping on hole and has not been as active lately.   Anxiety: he states usually takes Buspar prn  only, gets nervous before [employer] and bp spikes otherwise doing well .He does not need refills today    OSA: he wears his CPAP 5-7 days per week,  Modafinil helps him stay awake. 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,   Vitamin D deficiency: he is taking vitamin D otc  Unchanged  Dyslipidemia: he is on Rosuvastatin 40 and aspirin 81 mg  , LDL is at goal, but ASCVD is high and high cardiac calcium score  Family history of heart disease: he also has DM, HTN and obesity, Mother, grandmother , grandfather, one brother died at age 48 from heart attack and has a living brother that has heart disease He will follow up with cardiologist this week   BPH: he has some dribbling at the  end of micturition, he also has ED and is takign cialis Stable   Patient Active Problem List   Diagnosis Date Noted   Hypertension associated with type 2 diabetes mellitus 06/10/2021   Dyslipidemia due to type 2 diabetes mellitus 06/10/2021   Diabetes mellitus type 2 in obese 06/10/2021   BPH with obstruction/lower urinary tract symptoms 04/29/2020   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 08/06/2019   Vitamin D deficiency 01/29/2019   OSA on CPAP 09/29/2018   Diastasis recti 12/31/2016   Low testosterone in male 10/22/2016   Hyperlipidemia LDL goal <70 06/13/2015   Hypertension goal BP (blood pressure) < 130/80 06/13/2015   ED (erectile dysfunction) of organic origin 02/20/2015   Obesity, Class III, BMI 40-49.9 (morbid obesity) (HCC) 02/20/2015    Past Surgical History:  Procedure Laterality Date   COLONOSCOPY  2013   COLONOSCOPY WITH PROPOFOL N/A 06/02/2018   Procedure: COLONOSCOPY WITH PROPOFOL;  Surgeon: Wyline Mood, MD;  Location: [facility] ENDOSCOPY;  Service: Gastroenterology;  Laterality: N/A;    Family History  Problem Relation Age of Onset   Diabetes Mother    Kidney disease Mother    Diabetes Brother    Diabetes Brother    Diabetes Maternal Grandfather    Hypertension Maternal  Grandfather    Stroke Maternal Grandfather     Social History   Tobacco Use   Smoking status: Never   Smokeless tobacco: Never  Substance Use Topics   Alcohol use: No    Alcohol/week: 0.0 standard drinks of alcohol     Current Outpatient Medications:    Azelastine HCl 137 MCG/SPRAY SOLN, PLACE 2 SPRAYS INTO BOTH NOSTRILS 2 (TWO) TIMES DAILY. USE IN EACH NOSTRIL AS DIRECTED, Disp: 30 mL, Rfl: 1   BESIVANCE 0.6 % SUSP, , Disp: , Rfl:    brimonidine (ALPHAGAN) 0.15 % ophthalmic solution, Place 1 drop into the right eye daily., Disp: , Rfl:    busPIRone (BUSPAR) 5 MG tablet, TAKE 1 TABLET BY MOUTH TWICE A DAY AS NEEDED, Disp: 60 tablet, Rfl: 0    Continuous Blood Gluc Sensor (FREESTYLE LIBRE 3 SENSOR) MISC, USE 1 EACH EVERY 14 (FOURTEEN) DAYS, Disp: , Rfl:    Dapagliflozin-metFORMIN HCl ER (XIGDUO XR) 04-999 MG TB24, Take 2 tablets by mouth daily., Disp: , Rfl:    glipiZIDE (GLUCOTROL XL) 10 MG 24 hr tablet, Take 1 tablet by mouth daily., Disp: , Rfl:    glucose blood (ONE TOUCH ULTRA TEST) test strip, Use as instructed, Disp: 300 each, Rfl: 3   hydrALAZINE (APRESOLINE) 10 MG tablet, TAKE 1 TABLET (10 MG TOTAL) BY MOUTH DAILY AS NEEDED WHEN BLOOD PRESSURE ABOVE 145/90, Disp: 90 tablet, Rfl: 0   Insulin Pen Needle (B-D UF III MINI PEN NEEDLES) 31G X 5 MM MISC, FOR USE WITH INJECTABLE MEDICINE, ONCE A DAY, Disp: 90 each, Rfl: 3   levocetirizine (XYZAL) 5 MG tablet, TAKE 1 TABLET BY MOUTH EVERY DAY IN THE EVENING, Disp: 90 tablet, Rfl: 1   modafinil (PROVIGIL) 200 MG tablet, TAKE 1 TABLET BY MOUTH EVERY DAY, Disp: 90 tablet, Rfl: 0   Nebivolol HCl 20 MG TABS, Take 1 tablet (20 mg total) by mouth daily. In place of metoprolol, Disp: 90 tablet, Rfl: 1   olmesartan-hydrochlorothiazide (BENICAR HCT) 40-25 MG tablet, TAKE 1 TABLET BY MOUTH EVERY DAY, Disp: 90 tablet, Rfl: 1   ONETOUCH DELICA LANCETS FINE MISC, 1 Device by Does not apply route 3 (three) times daily., Disp: 300 each, Rfl: 3   rosuvastatin (CRESTOR) 40 MG tablet, Take 1 tablet (40 mg total) by mouth daily., Disp: 90 tablet, Rfl: 1   tadalafil (CIALIS) 20 MG tablet, Take 1 tablet (20 mg total) by mouth every three (3) days as needed for erectile dysfunction., Disp: 90 tablet, Rfl: 0   TRULICITY 4.5 MG/0.5ML SOPN, Inject 4.5 mg into the skin once a week., Disp: , Rfl:   No Known Allergies  I personally reviewed active problem list, medication list, allergies, family history, social history, health maintenance with the patient/caregiver today.   ROS  Ten systems reviewed and is negative except as mentioned in HPI    Objective  Vitals:   04/11/23 1454  BP: 126/72  Pulse: 89   Resp: 16  SpO2: 99%  Weight: 295 lb (133.8 kg)  Height:  (1.778 m)    Body mass index is 42.33 kg/m.  Physical Exam  Constitutional: Patient appears well-developed and well-nourished. Obese  No distress.  HEENT: head atraumatic, normocephalic, pupils equal and reactive to light, neck supple Cardiovascular: Normal rate, regular rhythm and normal heart sounds.  No murmur heard. No BLE edema. Pulmonary/Chest: Effort normal and breath sounds normal. No respiratory distress. Abdominal: Soft.  There is no tenderness. Psychiatric: Patient has a normal mood and  affect. behavior is normal. Judgment and thought content normal.   Recent Results (from the past 2160 hour(s))  Microalbumin / creatinine urine ratio     Status: None   Collection Time: 02/17/23 12:00 AM  Result Value Ref Range   Microalb Creat Ratio 60   Protein / creatinine ratio, urine     Status: None   Collection Time: 02/17/23 12:00 AM  Result Value Ref Range   Creatinine, Urine 91.7    Albumin, U 55   Basic metabolic panel     Status: Abnormal   Collection Time: 02/17/23 12:00 AM  Result Value Ref Range   Glucose 136    BUN 14 4 - 21   CO2 28 (A) 13 - 22   Creatinine 0.9 0.6 - 1.3   Potassium 3.8 3.5 - 5.1 mEq/L   Sodium 139 137 - 147   Chloride 104 99 - 108  Comprehensive metabolic panel     Status: None   Collection Time: 02/17/23 12:00 AM  Result Value Ref Range   eGFR 101    Calcium 8.9 8.7 - 10.7  Lipid panel     Status: None   Collection Time: 02/17/23 12:00 AM  Result Value Ref Range   Triglycerides 132 40 - 160   Cholesterol 106 0 - 200   HDL 35 35 - 70   LDL Cholesterol 45   Hemoglobin A1c     Status: None   Collection Time: 02/17/23 12:00 AM  Result Value Ref Range   Hemoglobin A1C 7.5      PHQ2/9:    04/11/2023    2:51 PM 02/14/2023    1:08 PM 11/08/2022    3:01 PM 05/04/2022    1:46 PM 12/14/2021    3:14 PM  Depression screen PHQ 2/9  Decreased Interest 0 0 0 0 0  Down,  Depressed, Hopeless 0 0 0 0 0  PHQ - 2 Score 0 0 0 0 0  Altered sleeping 0 0 0 0 0  Tired, decreased energy 0 0 0 0 0  Change in appetite 0 0 0 0 0  Feeling bad or failure about yourself  0 0 0 0 0  Trouble concentrating 0 0 0 0 0  Moving slowly or fidgety/restless 0 0 0 0 0  Suicidal thoughts 0 0 0 0 0  PHQ-9 Score 0 0 0 0 0  Difficult doing work/chores  Not difficult at all       phq 9 is negative   Fall Risk:    04/11/2023    2:51 PM 02/14/2023    1:08 PM 11/08/2022    3:01 PM 05/04/2022    1:46 PM 12/14/2021    3:14 PM  Fall Risk   Falls in the past year? 0 0 0 0 0  Number falls in past yr: 0 0 0 0 0  Injury with Fall? 0 0 0 0 0  Risk for fall due to : No Fall Risks No Fall Risks No Fall Risks No Fall Risks No Fall Risks  Follow up Falls prevention discussed Falls prevention discussed;Education provided;Falls evaluation completed Falls prevention discussed Falls prevention discussed Falls prevention discussed      Functional Status Survey: Is the patient deaf or have difficulty hearing?: No Does the patient have difficulty seeing, even when wearing glasses/contacts?: No Does the patient have difficulty concentrating, remembering, or making decisions?: No Does the patient have difficulty walking or climbing stairs?: No Does the patient have difficulty dressing or bathing?:  No Does the patient have difficulty doing errands alone such as visiting a doctor's office or shopping?: No    Assessment & Plan  1. Shift work sleep disorder  - modafinil (PROVIGIL) 200 MG tablet; Take 1 tablet (200 mg total) by mouth daily.  Dispense: 90 tablet; Refill: 0  2. OSA (obstructive sleep apnea)  - modafinil (PROVIGIL) 200 MG tablet; Take 1 tablet (200 mg total) by mouth daily.  Dispense: 90 tablet; Refill: 0  3. Hypertension goal BP (blood pressure) < 130/80  - Nebivolol HCl 20 MG TABS; Take 1 tablet (20 mg total) by mouth daily. In place of metoprolol  Dispense: 90 tablet; Refill:  1  4. Hyperlipidemia LDL goal <70  - rosuvastatin (CRESTOR) 40 MG tablet; Take 1 tablet (40 mg total) by mouth daily.  Dispense: 90 tablet; Refill: 1  5. OSA on CPAP  Complaint   6. Hypertension associated with type 2 diabetes mellitus  Compliant   7. ED (erectile dysfunction) of organic origin  - tadalafil (CIALIS) 20 MG tablet; Take 1 tablet (20 mg total) by mouth every three (3) days as needed for erectile dysfunction.  Dispense: 90 tablet; Refill: 0  8. Obesity, Class III, BMI 40-49.9 (morbid obesity)   Discussed with the patient the risk posed by an increased BMI. Discussed importance of portion control, calorie counting and at least 150 minutes of physical activity weekly. Avoid sweet beverages and drink more water. Eat at least 6 servings of fruit and vegetables daily

## 2023-04-11 ENCOUNTER — Ambulatory Visit: Payer: BC Managed Care – PPO | Admitting: Family Medicine

## 2023-04-11 ENCOUNTER — Encounter: Payer: Self-pay | Admitting: Family Medicine

## 2023-04-11 VITALS — BP 126/72 | HR 89 | Resp 16 | Ht 70.0 in | Wt 295.0 lb

## 2023-04-11 DIAGNOSIS — I10 Essential (primary) hypertension: Secondary | ICD-10-CM

## 2023-04-11 DIAGNOSIS — N529 Male erectile dysfunction, unspecified: Secondary | ICD-10-CM

## 2023-04-11 DIAGNOSIS — G4733 Obstructive sleep apnea (adult) (pediatric): Secondary | ICD-10-CM | POA: Diagnosis not present

## 2023-04-11 DIAGNOSIS — E785 Hyperlipidemia, unspecified: Secondary | ICD-10-CM

## 2023-04-11 DIAGNOSIS — G4726 Circadian rhythm sleep disorder, shift work type: Secondary | ICD-10-CM

## 2023-04-11 DIAGNOSIS — E1159 Type 2 diabetes mellitus with other circulatory complications: Secondary | ICD-10-CM

## 2023-04-11 DIAGNOSIS — I152 Hypertension secondary to endocrine disorders: Secondary | ICD-10-CM

## 2023-04-11 MED ORDER — NEBIVOLOL HCL 20 MG PO TABS: 20.0000 mg | ORAL_TABLET | Freq: Every day | ORAL | 1 refills | Status: DC

## 2023-04-11 MED ORDER — ROSUVASTATIN CALCIUM 40 MG PO TABS: 40.0000 mg | ORAL_TABLET | Freq: Every day | ORAL | 1 refills | Status: DC

## 2023-04-11 MED ORDER — TADALAFIL 20 MG PO TABS
20.0000 mg | ORAL_TABLET | ORAL | 0 refills | Status: DC | PRN
Start: 2023-04-11 — End: 2023-09-12

## 2023-04-11 MED ORDER — MODAFINIL 200 MG PO TABS: 200.0000 mg | ORAL_TABLET | Freq: Every day | ORAL | 0 refills | Status: DC

## 2023-04-15 ENCOUNTER — Ambulatory Visit: Payer: BC Managed Care – PPO | Admitting: Nurse Practitioner

## 2023-04-29 ENCOUNTER — Encounter (INDEPENDENT_AMBULATORY_CARE_PROVIDER_SITE_OTHER): Payer: BC Managed Care – PPO | Admitting: Ophthalmology

## 2023-04-29 DIAGNOSIS — I10 Essential (primary) hypertension: Secondary | ICD-10-CM

## 2023-04-29 DIAGNOSIS — E113291 Type 2 diabetes mellitus with mild nonproliferative diabetic retinopathy without macular edema, right eye: Secondary | ICD-10-CM

## 2023-04-29 DIAGNOSIS — H35033 Hypertensive retinopathy, bilateral: Secondary | ICD-10-CM

## 2023-04-29 DIAGNOSIS — E113392 Type 2 diabetes mellitus with moderate nonproliferative diabetic retinopathy without macular edema, left eye: Secondary | ICD-10-CM

## 2023-04-29 DIAGNOSIS — Z7985 Long-term (current) use of injectable non-insulin antidiabetic drugs: Secondary | ICD-10-CM

## 2023-04-29 DIAGNOSIS — H43813 Vitreous degeneration, bilateral: Secondary | ICD-10-CM

## 2023-05-12 ENCOUNTER — Ambulatory Visit: Payer: BC Managed Care – PPO | Attending: Nurse Practitioner | Admitting: Nurse Practitioner

## 2023-05-12 NOTE — Patient Instructions (Signed)

## 2023-05-12 NOTE — Progress Notes (Deleted)
Office Visit    Patient Name: Clarence Black Date of Encounter: 05/12/2023  Primary Care Provider:  Alba Cory, MD Primary Cardiologist:  Julien Nordmann, MD  Chief Complaint    57 year old male with a history of hypertension, diabetes, hyperlipidemia, obesity, sleep apnea, and erectile dysfunction, who presents for ***  Past Medical History    Past Medical History:  Diagnosis Date   CAD (coronary artery disease)    a. calcium score 334 in 12/23 involving the LAD & RCA. 94th %'ile.   Diabetic retinopathy (HCC)    Diastasis recti 12/31/2016   Erectile dysfunction associated with type 2 diabetes mellitus (HCC) 02/20/2015   Family history of premature CAD    a. 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   Hyperlipidemia LDL goal <70 06/13/2015   Hypertension    Hypogonadism, male    Obesity, Class III, BMI 40-49.9 (morbid obesity) (HCC) 02/20/2015   Sleep apnea 09/29/2018   Uncontrolled type 2 diabetes mellitus with peripheral circulatory disorder 06/13/2015   Past Surgical History:  Procedure Laterality Date   COLONOSCOPY  2013   COLONOSCOPY WITH PROPOFOL N/A 06/02/2018   Procedure: COLONOSCOPY WITH PROPOFOL;  Surgeon: Wyline Mood, MD;  Location: [facility] ENDOSCOPY;  Service: Gastroenterology;  Laterality: N/A;    Allergies  No Known Allergies  History of Present Illness    57 year old male with above past medical history including hypertension, diabetes, hyperlipidemia, obesity, sleep apnea, and erectile dysfunction.  He was previously evaluated May 2021 secondary to abnormal EKG, which was felt to be due to lead placement.  In the setting of ongoing risk factors, he reestablish care in late 2023 after cardiac CT showed a calcium score of 334, predominantly involving the LAD and RCA (94th percentile).  At January 2024 follow-up, he was offered stress testing for risk stratification as he was planning to initiate an exercise program however, he  deferred.  He was maintained on statin therapy.  Home Medications    Current Outpatient Medications  Medication Sig Dispense Refill   brimonidine (ALPHAGAN) 0.15 % ophthalmic solution Place 1 drop into the right eye daily.     busPIRone (BUSPAR) 5 MG tablet TAKE 1 TABLET BY MOUTH TWICE A DAY AS NEEDED 60 tablet 0   Continuous Blood Gluc Sensor (FREESTYLE LIBRE 3 SENSOR) MISC USE 1 EACH EVERY 14 (FOURTEEN) DAYS     Dapagliflozin-metFORMIN HCl ER (XIGDUO XR) 04-999 MG TB24 Take 2 tablets by mouth daily.     glipiZIDE (GLUCOTROL XL) 10 MG 24 hr tablet Take 1 tablet by mouth daily.     glucose blood (ONE TOUCH ULTRA TEST) test strip Use as instructed 300 each 3   hydrALAZINE (APRESOLINE) 10 MG tablet TAKE 1 TABLET (10 MG TOTAL) BY MOUTH DAILY AS NEEDED WHEN BLOOD PRESSURE ABOVE 145/90 90 tablet 0   levocetirizine (XYZAL) 5 MG tablet TAKE 1 TABLET BY MOUTH EVERY DAY IN THE EVENING 90 tablet 1   modafinil (PROVIGIL) 200 MG tablet Take 1 tablet (200 mg total) by mouth daily. 90 tablet 0   Nebivolol HCl 20 MG TABS Take 1 tablet (20 mg total) by mouth daily. In place of metoprolol 90 tablet 1   olmesartan-hydrochlorothiazide (BENICAR HCT) 40-25 MG tablet TAKE 1 TABLET BY MOUTH EVERY DAY 90 tablet 1   ONETOUCH DELICA LANCETS FINE MISC 1 Device by Does not apply route 3 (three) times daily. 300 each 3   OZEMPIC, 2 MG/DOSE, 8 MG/3ML SOPN Inject  2 mg into the skin once a week.     rosuvastatin (CRESTOR) 40 MG tablet Take 1 tablet (40 mg total) by mouth daily. 90 tablet 1   tadalafil (CIALIS) 20 MG tablet Take 1 tablet (20 mg total) by mouth every three (3) days as needed for erectile dysfunction. 90 tablet 0   No current facility-administered medications for this visit.     Review of Systems    ***.  All other systems reviewed and are otherwise negative except as noted above.    Physical Exam    VS:  There were no vitals taken for this visit. , BMI There is no height or weight on file to calculate  BMI.     GEN: Well nourished, well developed, in no acute distress. HEENT: normal. Neck: Supple, no JVD, carotid bruits, or masses. Cardiac: RRR, no murmurs, rubs, or gallops. No clubbing, cyanosis, edema.  Radials 2+/PT 2+ and equal bilaterally.  Respiratory:  Respirations regular and unlabored, clear to auscultation bilaterally. GI: Soft, nontender, nondistended, BS + x 4. MS: no deformity or atrophy. Skin: warm and dry, no rash. Neuro:  Strength and sensation are intact. Psych: Normal affect.  Accessory Clinical Findings    ECG personally reviewed by me today - *** - no acute changes.  Lab Results  Component Value Date   WBC 7.7 06/23/2018   HGB 15.7 06/23/2018   HCT 46.5 06/23/2018   MCV 83.9 06/23/2018   PLT 176 06/23/2018   Lab Results  Component Value Date   CREATININE 0.9 02/17/2023   BUN 14 02/17/2023   NA 139 02/17/2023   K 3.8 02/17/2023   CL 104 02/17/2023   CO2 28 (A) 02/17/2023   Lab Results  Component Value Date   ALT 21 05/04/2022   AST 22 05/04/2022   ALKPHOS 66 10/22/2016   BILITOT 0.7 05/04/2022   Lab Results  Component Value Date   CHOL 106 02/17/2023   HDL 35 02/17/2023   LDLCALC 45 02/17/2023   TRIG 132 02/17/2023   CHOLHDL 2.9 03/09/2021    Lab Results  Component Value Date   HGBA1C 7.5 02/17/2023    Assessment & Plan    1.  ***   Nicolasa Ducking, NP 05/12/2023, 1:35 PM

## 2023-05-12 NOTE — Progress Notes (Unsigned)
Name: Clarence Black   MRN: [id number]    DOB: 1966/05/03   Date:05/13/2023       Progress Note  Subjective  Chief Complaint  Annual Exam  HPI  Patient presents for annual CPE.  IPSS Questionnaire (AUA-7): Over the past month.   1)  How often have you had a sensation of not emptying your bladder completely after you finish urinating?  0 - Not at all  2)  How often have you had to urinate again less than two hours after you finished urinating? 0 - Not at all  3)  How often have you found you stopped and started again several times when you urinated?  1 - Less than 1 time in 5  4) How difficult have you found it to postpone urination?  0 - Not at all  5) How often have you had a weak urinary stream?  0 - Not at all  6) How often have you had to push or strain to begin urination?  0 - Not at all  7) How many times did you most typically get up to urinate from the time you went to bed until the time you got up in the morning?  0 - None  Total score:  0-7 mildly symptomatic   8-19 moderately symptomatic   20-35 severely symptomatic     Diet: trying to follow a more balanced diet  Exercise: walking three times a week for about 30 minutes  Last Dental Exam: up to date  Last Eye Exam: up to date   Depression: phq 9 is negative    05/13/2023    3:29 PM 04/11/2023    2:51 PM 02/14/2023    1:08 PM 11/08/2022    3:01 PM 05/04/2022    1:46 PM  Depression screen PHQ 2/9  Decreased Interest 0 0 0 0 0  Down, Depressed, Hopeless 0 0 0 0 0  PHQ - 2 Score 0 0 0 0 0  Altered sleeping 0 0 0 0 0  Tired, decreased energy 0 0 0 0 0  Change in appetite 0 0 0 0 0  Feeling bad or failure about yourself  0 0 0 0 0  Trouble concentrating 0 0 0 0 0  Moving slowly or fidgety/restless 0 0 0 0 0  Suicidal thoughts 0 0 0 0 0  PHQ-9 Score 0 0 0 0 0  Difficult doing work/chores   Not difficult at all      Hypertension:  BP Readings from Last 3 Encounters:  05/13/23 132/80  04/11/23 126/72  02/14/23  (!) 144/82    Obesity: Wt Readings from Last 3 Encounters:  05/13/23 298 lb (135.2 kg)  04/11/23 295 lb (133.8 kg)  02/14/23 288 lb 11.2 oz (131 kg)   BMI Readings from Last 3 Encounters:  05/13/23 42.76 kg/m  04/11/23 42.33 kg/m  02/14/23 41.42 kg/m     Lipids:  Lab Results  Component Value Date   CHOL 106 02/17/2023   CHOL 112 03/09/2021   CHOL 132 08/04/2020   Lab Results  Component Value Date   HDL 35 02/17/2023   HDL 39 (L) 03/09/2021   HDL 39 08/04/2020   Lab Results  Component Value Date   LDLCALC 45 02/17/2023   LDLCALC 53 03/09/2021   LDLCALC 72 08/04/2020   Lab Results  Component Value Date   TRIG 132 02/17/2023   TRIG 114 03/09/2021   TRIG 106 08/04/2020   Lab Results  Component Value Date  CHOLHDL 2.9 03/09/2021   CHOLHDL 3.6 02/06/2020   CHOLHDL 3.1 01/29/2019   No results found for: "LDLDIRECT" Glucose:  Glucose, Bld  Date Value Ref Range Status  05/04/2022 185 (H) 65 - 99 mg/dL Final    Comment:    .            Fasting reference interval . For someone without known diabetes, a glucose value >125 mg/dL indicates that they may have diabetes and this should be confirmed with a follow-up test. .   03/09/2021 122 (H) 65 - 99 mg/dL Final    Comment:    .            Fasting reference interval . For someone without known diabetes, a glucose value between 100 and 125 mg/dL is consistent with prediabetes and should be confirmed with a follow-up test. .   02/06/2020 94 65 - 99 mg/dL Final    Comment:    .            Fasting reference interval .    Glucose-Capillary  Date Value Ref Range Status  06/02/2018 173 (H) 65 - 99 mg/dL Final    Flowsheet Row Office Visit from 05/07/2022 in [facility]  AUDIT-C Score 0       Married STD testing and prevention (HIV/chl/gon/syphilis): 03/09/21 Sexual history: he has ED controlled with Cialis, normal libido  Hep C Screening: 04/29/20 Skin cancer: Discussed  monitoring for atypical lesions Colorectal cancer: 06/02/18 Prostate cancer:   Lab Results  Component Value Date   PSA 0.35 05/04/2022   PSA 0.28 05/01/2021   PSA 0.3 04/29/2020     Lung cancer:  Low Dose CT Chest recommended if Age 59-80 years, 30 pack-year currently smoking OR have quit w/in 15years. Patient  not a candidate for screening   AAA:NA ECG:  01/06/23  Vaccines:   HPV: N/A Tdap: up to date Shingrix: up to date Pneumonia: up to date  Flu: up to date COVID-19: up to date  Advanced Care Planning: A voluntary discussion about advance care planning including the explanation and discussion of advance directives.  Discussed health care proxy and Living will, and the patient was able to identify a health care proxy as wife.  Patient has a living will and power of attorney of health care   Patient Active Problem List   Diagnosis Date Noted   Hypertension associated with type 2 diabetes mellitus (HCC) 06/10/2021   Dyslipidemia due to type 2 diabetes mellitus (HCC) 06/10/2021   Diabetes mellitus type 2 in obese 06/10/2021   BPH with obstruction/lower urinary tract symptoms 04/29/2020   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) 08/06/2019   Vitamin D deficiency 01/29/2019   OSA on CPAP 09/29/2018   Diastasis recti 12/31/2016   Low testosterone in male 10/22/2016   Hyperlipidemia LDL goal <70 06/13/2015   Hypertension goal BP (blood pressure) < 130/80 06/13/2015   ED (erectile dysfunction) of organic origin 02/20/2015   Obesity, Class III, BMI 40-49.9 (morbid obesity) (HCC) 02/20/2015    Past Surgical History:  Procedure Laterality Date   COLONOSCOPY  2013   COLONOSCOPY WITH PROPOFOL N/A 06/02/2018   Procedure: COLONOSCOPY WITH PROPOFOL;  Surgeon: Wyline Mood, MD;  [facility] END[facility];  Service: Gastroenterology;  Laterality: N/A;    Family History  Problem Relation Age of Onset   Diabetes Mother    Kidney disease  Mother    Diabetes Brother    Diabetes  Brother    Diabetes Maternal Grandfather    Hypertension Maternal Grandfather    Stroke Maternal Grandfather     Social History   Socioeconomic History   Marital status: Married    Spouse name: Para March    Number of children: 3   Years of education: Not on file   Highest education level: Not on file  Occupational History   Occupation: driver   Tobacco Use   Smoking status: Never   Smokeless tobacco: Never  Vaping Use   Vaping Use: Never used  Substance and Sexual Activity   Alcohol use: No    Alcohol/week: 0.0 standard drinks of alcohol   Drug use: No   Sexual activity: Yes    Partners: Female  Other Topics Concern   Not on file  Social History Narrative   Not on file   Social Determinants of Health   Financial Resource Strain: Low Risk  (05/13/2023)   Overall Financial Resource Strain (CARDIA)    Difficulty of Paying Living Expenses: Not hard at all  Food Insecurity: No Food Insecurity (05/13/2023)   Hunger Vital Sign    Worried About Running Out of Food in the Last Year: Never true    Ran Out of Food in the Last Year: Never true  Transportation Needs: No Transportation Needs (05/13/2023)   PRAPARE - Administrator, Civil Service (Medical): No    Lack of Transportation (Non-Medical): No  Physical Activity: Insufficiently Active (05/13/2023)   Exercise Vital Sign    Days of Exercise per Week: 3 days    Minutes of Exercise per Session: 30 min  Stress: No Stress Concern Present (05/13/2023)   [employer] of Occupational Health - Occupational Stress Questionnaire    Feeling of Stress : Not at all  Social Connections: Socially Integrated (05/13/2023)   Social Connection and Isolation Panel [NHANES]    Frequency of Communication with Friends and Family: More than three times a week    Frequency of Social Gatherings with Friends and Family: More than three times a week    Attends Religious Services: More than 4  times per year    Active Member of [employer] or Organizations: Yes    Attends Engineer, structural: More than 4 times per year    Marital Status: Married  Catering manager Violence: Not At Risk (05/13/2023)   Humiliation, Afraid, Rape, and Kick questionnaire    Fear of Current or Ex-Partner: No    Emotionally Abused: No    Physically Abused: No    Sexually Abused: No     Current Outpatient Medications:    brimonidine (ALPHAGAN) 0.15 % ophthalmic solution, Place 1 drop into the right eye daily., Disp: , Rfl:    busPIRone (BUSPAR) 5 MG tablet, TAKE 1 TABLET BY MOUTH TWICE A DAY AS NEEDED, Disp: 60 tablet, Rfl: 0   Continuous Blood Gluc Sensor (FREESTYLE LIBRE 3 SENSOR) MISC, USE 1 EACH EVERY 14 (FOURTEEN) DAYS, Disp: , Rfl:    Dapagliflozin-metFORMIN HCl ER (XIGDUO XR) 04-999 MG TB24, Take 2 tablets by mouth daily., Disp: , Rfl:    glipiZIDE (GLUCOTROL XL) 10 MG 24 hr tablet, Take 1 tablet by mouth daily., Disp: , Rfl:    glucose blood (ONE TOUCH ULTRA TEST) test strip, Use as instructed, Disp: 300 each, Rfl: 3   hydrALAZINE (APRESOLINE) 10 MG tablet, TAKE 1 TABLET (10 MG TOTAL) BY MOUTH DAILY AS NEEDED WHEN BLOOD PRESSURE ABOVE 145/90, Disp: 90 tablet, Rfl: 0   levocetirizine (  XYZAL) 5 MG tablet, TAKE 1 TABLET BY MOUTH EVERY DAY IN THE EVENING, Disp: 90 tablet, Rfl: 1   modafinil (PROVIGIL) 200 MG tablet, Take 1 tablet (200 mg total) by mouth daily., Disp: 90 tablet, Rfl: 0   Nebivolol HCl 20 MG TABS, Take 1 tablet (20 mg total) by mouth daily. In place of metoprolol, Disp: 90 tablet, Rfl: 1   olmesartan-hydrochlorothiazide (BENICAR HCT) 40-25 MG tablet, TAKE 1 TABLET BY MOUTH EVERY DAY, Disp: 90 tablet, Rfl: 1   ONETOUCH DELICA LANCETS FINE MISC, 1 Device by Does not apply route 3 (three) times daily., Disp: 300 each, Rfl: 3   OZEMPIC, 2 MG/DOSE, 8 MG/3ML SOPN, Inject 2 mg into the skin once a week., Disp: , Rfl:    rosuvastatin (CRESTOR) 40 MG tablet, Take 1 tablet (40 mg total) by  mouth daily., Disp: 90 tablet, Rfl: 1   tadalafil (CIALIS) 20 MG tablet, Take 1 tablet (20 mg total) by mouth every three (3) days as needed for erectile dysfunction., Disp: 90 tablet, Rfl: 0  No Known Allergies   ROS  Constitutional: Negative for fever , positive for weight change.  Respiratory: Negative for cough and shortness of breath.   Cardiovascular: Negative for chest pain or palpitations.  Gastrointestinal: Negative for abdominal pain, no bowel changes.  Musculoskeletal: Negative for gait problem or joint swelling.  Skin: Negative for rash.  Neurological: Negative for dizziness or headache.  No other specific complaints in a complete review of systems (except as listed in HPI above).   Objective  Vitals:   05/13/23 1529  BP: 132/80  Pulse: 90  Resp: 16  SpO2: 98%  Weight: 298 lb (135.2 kg)  Height: 5\' 10"  (1.778 m)    Body mass index is 42.76 kg/m.  Physical Exam  Constitutional: Patient appears well-developed and well-nourished. No distress.  HENT: Head: Normocephalic and atraumatic. Ears: B TMs ok, no erythema or effusion; Nose: Nose normal. Mouth/Throat: Oropharynx is clear and moist. No oropharyngeal exudate.  Eyes: Conjunctivae and EOM are normal. Pupils are equal, round, and reactive to light. No scleral icterus.  Neck: Normal range of motion. Neck supple. No JVD present. No thyromegaly present.  Cardiovascular: Normal rate, regular rhythm and normal heart sounds.  No murmur heard. No BLE edema. Pulmonary/Chest: Effort normal and breath sounds normal. No respiratory distress. Abdominal: Soft. Bowel sounds are normal, no distension. There is no tenderness. no masses MALE GENITALIA: Normal descended testes bilaterally, no masses palpated, no hernias, no lesions, no discharge RECTAL: Prostate normal size and consistency, no rectal masses or hemorrhoids *** Musculoskeletal: Normal range of motion, no joint effusions. No gross deformities Neurological: he is  alert and oriented to person, place, and time. No cranial nerve deficit. Coordination, balance, strength, speech and gait are normal.  Skin: Skin is warm and dry. No rash noted. No erythema.  Psychiatric: Patient has a normal mood and affect. behavior is normal. Judgment and thought content normal.   Recent Results (from the past 2160 hour(s))  Microalbumin / creatinine urine ratio     Status: None   Collection Time: 02/17/23 12:00 AM  Result Value Ref Range   Microalb Creat Ratio 60   Protein / creatinine ratio, urine     Status: None   Collection Time: 02/17/23 12:00 AM  Result Value Ref Range   Creatinine, Urine 91.7    Albumin, U 55   Basic metabolic panel     Status: Abnormal   Collection Time: 02/17/23 12:00 AM  Result Value Ref Range   Glucose 136    BUN 14 4 - 21   CO2 28 (A) 13 - 22   Creatinine 0.9 0.6 - 1.3   Potassium 3.8 3.5 - 5.1 mEq/L   Sodium 139 137 - 147   Chloride 104 99 - 108  Comprehensive metabolic panel     Status: None   Collection Time: 02/17/23 12:00 AM  Result Value Ref Range   eGFR 101    Calcium 8.9 8.7 - 10.7  Lipid panel     Status: None   Collection Time: 02/17/23 12:00 AM  Result Value Ref Range   Triglycerides 132 40 - 160   Cholesterol 106 0 - 200   HDL 35 35 - 70   LDL Cholesterol 45   Hemoglobin A1c     Status: None   Collection Time: 02/17/23 12:00 AM  Result Value Ref Range   Hemoglobin A1C 7.5      Fall Risk:    05/13/2023    3:29 PM 04/11/2023    2:51 PM 02/14/2023    1:08 PM 11/08/2022    3:01 PM 05/04/2022    1:46 PM  Fall Risk   Falls in the past year? 0 0 0 0 0  Number falls in past yr: 0 0 0 0 0  Injury with Fall? 0 0 0 0 0  Risk for fall due to : No Fall Risks No Fall Risks No Fall Risks No Fall Risks No Fall Risks  Follow up Falls prevention discussed Falls prevention discussed Falls prevention discussed;Education provided;Falls evaluation completed Falls prevention discussed Falls prevention discussed      Functional Status Survey: Is the patient deaf or have difficulty hearing?: No Does the patient have difficulty seeing, even when wearing glasses/contacts?: No Does the patient have difficulty concentrating, remembering, or making decisions?: No Does the patient have difficulty walking or climbing stairs?: No Does the patient have difficulty dressing or bathing?: No Does the patient have difficulty doing errands alone such as visiting a doctor's office or shopping?: No    Assessment & Plan  1. Well adult exam ***  2. Prostate cancer screening *** - PSA  3. Long-term use of high-risk medication *** - CBC with Differential/Platelet - COMPLETE METABOLIC PANEL WITH GFR - B12 and Folate Panel  4. OSA on CPAP *** - CBC with Differential/Platelet  5. Vitamin D deficiency *** - VITAMIN D 25 Hydroxy (Vit-D Deficiency, Fractures)    -Prostate cancer screening and PSA options (with potential risks and benefits of testing vs not testing) were discussed along with recent recs/guidelines. -USPSTF grade A and B recommendations reviewed with patient; age-appropriate recommendations, preventive care, screening tests, etc discussed and encouraged; healthy living encouraged; see AVS for patient education given to patient -Discussed importance of 150 minutes of physical activity weekly, eat two servings of fish weekly, eat one serving of tree nuts ( cashews, pistachios, pecans, almonds.Marland Kitchen) every other day, eat 6 servings of fruit/vegetables daily and drink plenty of water and avoid sweet beverages.  -Reviewed Health Maintenance: yes

## 2023-05-13 ENCOUNTER — Ambulatory Visit (INDEPENDENT_AMBULATORY_CARE_PROVIDER_SITE_OTHER): Payer: BC Managed Care – PPO | Admitting: Family Medicine

## 2023-05-13 ENCOUNTER — Encounter: Payer: Self-pay | Admitting: Family Medicine

## 2023-05-13 ENCOUNTER — Encounter: Payer: Self-pay | Admitting: Nurse Practitioner

## 2023-05-13 VITALS — BP 132/80 | HR 90 | Resp 16 | Ht 70.0 in | Wt 298.0 lb

## 2023-05-13 DIAGNOSIS — Z125 Encounter for screening for malignant neoplasm of prostate: Secondary | ICD-10-CM

## 2023-05-13 DIAGNOSIS — Z79899 Other long term (current) drug therapy: Secondary | ICD-10-CM

## 2023-05-13 DIAGNOSIS — G4733 Obstructive sleep apnea (adult) (pediatric): Secondary | ICD-10-CM | POA: Diagnosis not present

## 2023-05-13 DIAGNOSIS — E559 Vitamin D deficiency, unspecified: Secondary | ICD-10-CM

## 2023-05-13 DIAGNOSIS — Z0000 Encounter for general adult medical examination without abnormal findings: Secondary | ICD-10-CM | POA: Diagnosis not present

## 2023-05-13 LAB — CBC WITH DIFFERENTIAL/PLATELET
Basophils Relative: 0.5 %
Eosinophils Absolute: 202 cells/uL (ref 15–500)
HCT: 43.8 % (ref 38.5–50.0)
MCHC: 33.1 g/dL (ref 32.0–36.0)
MCV: 84.9 fL (ref 80.0–100.0)
Monocytes Relative: 7.1 %
Neutrophils Relative %: 51 %
Platelets: 163 10*3/uL (ref 140–400)
RBC: 5.16 10*6/uL (ref 4.20–5.80)
WBC: 6.3 10*3/uL (ref 3.8–10.8)

## 2023-05-14 LAB — COMPLETE METABOLIC PANEL WITH GFR
AG Ratio: 2 (calc) (ref 1.0–2.5)
ALT: 23 U/L (ref 9–46)
AST: 22 U/L (ref 10–35)
Albumin: 4.3 g/dL (ref 3.6–5.1)
Alkaline phosphatase (APISO): 55 U/L (ref 35–144)
BUN: 15 mg/dL (ref 7–25)
CO2: 27 mmol/L (ref 20–32)
Calcium: 9.2 mg/dL (ref 8.6–10.3)
Chloride: 103 mmol/L (ref 98–110)
Creat: 1 mg/dL (ref 0.70–1.30)
Globulin: 2.2 g/dL (calc) (ref 1.9–3.7)
Glucose, Bld: 225 mg/dL — ABNORMAL HIGH (ref 65–99)
Potassium: 3.6 mmol/L (ref 3.5–5.3)
Sodium: 139 mmol/L (ref 135–146)
Total Bilirubin: 0.7 mg/dL (ref 0.2–1.2)
Total Protein: 6.5 g/dL (ref 6.1–8.1)
eGFR: 89 mL/min/{1.73_m2} (ref >=60)

## 2023-05-14 LAB — VITAMIN D 25 HYDROXY (VIT D DEFICIENCY, FRACTURES): Vit D, 25-Hydroxy: 29 ng/mL — ABNORMAL LOW (ref 30–100)

## 2023-05-14 LAB — B12 AND FOLATE PANEL
Folate: 8.8 ng/mL
Vitamin B-12: 647 pg/mL (ref 200–1100)

## 2023-05-14 LAB — CBC WITH DIFFERENTIAL/PLATELET
Absolute Monocytes: 447 cells/uL (ref 200–950)
Basophils Absolute: 32 cells/uL (ref 0–200)
Eosinophils Relative: 3.2 %
Hemoglobin: 14.5 g/dL (ref 13.2–17.1)
Lymphs Abs: 2407 cells/uL (ref 850–3900)
MCH: 28.1 pg (ref 27.0–33.0)
MPV: 11.8 fL (ref 7.5–12.5)
Neutro Abs: 3213 cells/uL (ref 1500–7800)
RDW: 14.7 % (ref 11.0–15.0)
Total Lymphocyte: 38.2 %

## 2023-05-14 LAB — PSA: PSA: 0.28 ng/mL (ref <=4.00)

## 2023-05-31 ENCOUNTER — Ambulatory Visit: Payer: BC Managed Care – PPO | Admitting: Family Medicine

## 2023-05-31 ENCOUNTER — Encounter: Payer: Self-pay | Admitting: Family Medicine

## 2023-05-31 VITALS — BP 124/70 | HR 89 | Temp 97.9°F | Resp 18 | Ht 70.5 in | Wt 296.2 lb

## 2023-05-31 DIAGNOSIS — M25562 Pain in left knee: Secondary | ICD-10-CM | POA: Diagnosis not present

## 2023-05-31 MED ORDER — CELECOXIB 100 MG PO CAPS
100.0000 mg | ORAL_CAPSULE | Freq: Two times a day (BID) | ORAL | 1 refills | Status: DC
Start: 2023-05-31 — End: 2023-07-20

## 2023-05-31 NOTE — Progress Notes (Signed)
Name: Clarence Black   MRN: [id number]    DOB: Aug 15, 1966   Date:05/31/2023       Progress Note  Subjective  Chief Complaint  Knee Injury  HPI  Acute left knee pain: he was mowing his yard on May 22 nd and missed a step and stumbled but did not fall. The whole was not big enough for him to fill it up. He had some knee pain but able to walk. He did not noticed redness or effusion. He has been taking Tylenol prn. He has noticed difficulty squatting, going up and down stairs and feels unsteady at times. Knee does not seem to lock.    Patient Active Problem List   Diagnosis Date Noted   Hypertension associated with type 2 diabetes mellitus (HCC) 06/10/2021   Dyslipidemia due to type 2 diabetes mellitus (HCC) 06/10/2021   Diabetes mellitus type 2 in obese 06/10/2021   BPH with obstruction/lower urinary tract symptoms 04/29/2020   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) 08/06/2019   Vitamin D deficiency 01/29/2019   OSA on CPAP 09/29/2018   Diastasis recti 12/31/2016   Low testosterone in male 10/22/2016   Hyperlipidemia LDL goal <70 06/13/2015   Hypertension goal BP (blood pressure) < 130/80 06/13/2015   ED (erectile dysfunction) of organic origin 02/20/2015   Obesity, Class III, BMI 40-49.9 (morbid obesity) (HCC) 02/20/2015    Past Surgical History:  Procedure Laterality Date   COLONOSCOPY  2013   COLONOSCOPY WITH PROPOFOL N/A 06/02/2018   Procedure: COLONOSCOPY WITH PROPOFOL;  Surgeon: Wyline Mood, MD;  Location: [facility] ENDOSCOPY;  Service: Gastroenterology;  Laterality: N/A;    Family History  Problem Relation Age of Onset   Diabetes Mother    Kidney disease Mother    Diabetes Brother    Diabetes Brother    Diabetes Maternal Grandfather    Hypertension Maternal Grandfather    Stroke Maternal Grandfather     Social History   Tobacco Use   Smoking status: Never   Smokeless tobacco: Never  Substance Use Topics   Alcohol use:  No    Alcohol/week: 0.0 standard drinks of alcohol     Current Outpatient Medications:    brimonidine (ALPHAGAN) 0.15 % ophthalmic solution, Place 1 drop into the right eye daily., Disp: , Rfl:    busPIRone (BUSPAR) 5 MG tablet, TAKE 1 TABLET BY MOUTH TWICE A DAY AS NEEDED, Disp: 60 tablet, Rfl: 0   Continuous Blood Gluc Sensor (FREESTYLE LIBRE 3 SENSOR) MISC, USE 1 EACH EVERY 14 (FOURTEEN) DAYS, Disp: , Rfl:    Dapagliflozin-metFORMIN HCl ER (XIGDUO XR) 04-999 MG TB24, Take 2 tablets by mouth daily., Disp: , Rfl:    glipiZIDE (GLUCOTROL XL) 10 MG 24 hr tablet, Take 1 tablet by mouth daily., Disp: , Rfl:    glucose blood (ONE TOUCH ULTRA TEST) test strip, Use as instructed, Disp: 300 each, Rfl: 3   hydrALAZINE (APRESOLINE) 10 MG tablet, TAKE 1 TABLET (10 MG TOTAL) BY MOUTH DAILY AS NEEDED WHEN BLOOD PRESSURE ABOVE 145/90, Disp: 90 tablet, Rfl: 0   levocetirizine (XYZAL) 5 MG tablet, TAKE 1 TABLET BY MOUTH EVERY DAY IN THE EVENING, Disp: 90 tablet, Rfl: 1   modafinil (PROVIGIL) 200 MG tablet, Take 1 tablet (200 mg total) by mouth daily., Disp: 90 tablet, Rfl: 0   Nebivolol HCl 20 MG TABS, Take 1 tablet (20 mg total) by mouth daily. In place of metoprolol, Disp: 90 tablet, Rfl: 1   olmesartan-hydrochlorothiazide (  BENICAR HCT) 40-25 MG tablet, TAKE 1 TABLET BY MOUTH EVERY DAY, Disp: 90 tablet, Rfl: 1   ONETOUCH DELICA LANCETS FINE MISC, 1 Device by Does not apply route 3 (three) times daily., Disp: 300 each, Rfl: 3   OZEMPIC, 2 MG/DOSE, 8 MG/3ML SOPN, Inject 2 mg into the skin once a week., Disp: , Rfl:    rosuvastatin (CRESTOR) 40 MG tablet, Take 1 tablet (40 mg total) by mouth daily., Disp: 90 tablet, Rfl: 1   tadalafil (CIALIS) 20 MG tablet, Take 1 tablet (20 mg total) by mouth every three (3) days as needed for erectile dysfunction., Disp: 90 tablet, Rfl: 0  No Known Allergies  I personally reviewed active problem list, medication list, allergies, family history, social history, health  maintenance with the patient/caregiver today.   ROS  Ten systems reviewed and is negative except as mentioned in HPI   Objective  Vitals:   05/31/23 1416  BP: 124/70  Pulse: 89  Resp: 18  Temp: 97.9 F (36.6 C)  TempSrc: Oral  SpO2: 96%  Weight: 296 lb 3.2 oz (134.4 kg)  Height: 5' 10.5" (1.791 m)    Body mass index is 41.9 kg/m.  Physical Exam  Constitutional: Patient appears well-developed and well-nourished. Obese  No distress.  HEENT: head atraumatic, normocephalic, pupils equal and reactive to light, neck supple Cardiovascular: Normal rate, regular rhythm and normal heart sounds.  No murmur heard. No BLE edema. Pulmonary/Chest: Effort normal and breath sounds normal. No respiratory distress. Abdominal: Soft.  There is no tenderness. Muscular skeletal: no effusion or redness, no increase in warmth, he has medial knee pain during palpation and with McMurray's maneuver  Psychiatric: Patient has a normal mood and affect. behavior is normal. Judgment and thought content normal.    PHQ2/9:    05/13/2023    3:29 PM 04/11/2023    2:51 PM 02/14/2023    1:08 PM 11/08/2022    3:01 PM 05/04/2022    1:46 PM  Depression screen PHQ 2/9  Decreased Interest 0 0 0 0 0  Down, Depressed, Hopeless 0 0 0 0 0  PHQ - 2 Score 0 0 0 0 0  Altered sleeping 0 0 0 0 0  Tired, decreased energy 0 0 0 0 0  Change in appetite 0 0 0 0 0  Feeling bad or failure about yourself  0 0 0 0 0  Trouble concentrating 0 0 0 0 0  Moving slowly or fidgety/restless 0 0 0 0 0  Suicidal thoughts 0 0 0 0 0  PHQ-9 Score 0 0 0 0 0  Difficult doing work/chores   Not difficult at all      phq 9 is negative   Fall Risk:    05/31/2023    2:18 PM 05/13/2023    3:29 PM 04/11/2023    2:51 PM 02/14/2023    1:08 PM 11/08/2022    3:01 PM  Fall Risk   Falls in the past year? 0 0 0 0 0  Number falls in past yr:  0 0 0 0  Injury with Fall?  0 0 0 0  Risk for fall due to : Orthopedic patient No Fall Risks No Fall  Risks No Fall Risks No Fall Risks  Follow up Falls prevention discussed;Education provided;Falls evaluation completed Falls prevention discussed Falls prevention discussed Falls prevention discussed;Education provided;Falls evaluation completed Falls prevention discussed      Functional Status Survey: Is the patient deaf or have difficulty hearing?: No Does the  patient have difficulty seeing, even when wearing glasses/contacts?: No Does the patient have difficulty concentrating, remembering, or making decisions?: No Does the patient have difficulty dressing or bathing?: No Does the patient have difficulty doing errands alone such as visiting a doctor's office or shopping?: No    Assessment & Plan  1. Acute pain of left knee  - celecoxib (CELEBREX) 100 MG capsule; Take 1 capsule (100 mg total) by mouth 2 (two) times daily.  Dispense: 60 capsule; Refill: 1   Discussed referral to ortho/X-ray  and PT or try ice, rest , nsaid's with food and home exercises to strengthen his knee.  He chose the later. He will contact me if no improvement for referral to Ortho if needed - since it will be cheaper to have X-ray done in the Ortho office

## 2023-06-07 ENCOUNTER — Other Ambulatory Visit: Payer: Self-pay | Admitting: Family Medicine

## 2023-06-07 DIAGNOSIS — I10 Essential (primary) hypertension: Secondary | ICD-10-CM

## 2023-06-28 ENCOUNTER — Other Ambulatory Visit: Payer: Self-pay | Admitting: Nurse Practitioner

## 2023-06-28 DIAGNOSIS — H9203 Otalgia, bilateral: Secondary | ICD-10-CM

## 2023-06-29 NOTE — Telephone Encounter (Signed)
Refilled 03/17/23 # 90 with 1 refill. Requested Prescriptions  Refused Prescriptions Disp Refills   levocetirizine (XYZAL) 5 MG tablet [Pharmacy Med Name: LEVOCETIRIZINE 5 MG TABLET] 90 tablet 1    Sig: TAKE 1 TABLET BY MOUTH EVERY DAY IN THE EVENING     Ear, Nose, and Throat:  Antihistamines - levocetirizine dihydrochloride Passed - 06/28/2023 11:29 AM      Passed - Cr in normal range and within 360 days    Creat  Date Value Ref Range Status  05/13/2023 1.00 0.70 - 1.30 mg/dL Final   Creatinine, Urine  Date Value Ref Range Status  02/17/2023 91.7  Final         Passed - eGFR is 10 or above and within 360 days    GFR, Est African American  Date Value Ref Range Status  03/09/2021 95 > OR = 60 mL/min/1.99m2 Final   GFR, Est Non African American  Date Value Ref Range Status  03/09/2021 82 > OR = 60 mL/min/1.88m2 Final   eGFR  Date Value Ref Range Status  05/13/2023 89 > OR = 60 mL/min/1.2m2 Final         Passed - Valid encounter within last 12 months    Recent Outpatient Visits           4 weeks ago Acute pain of left knee   [facility] Alba Cory, MD   1 month ago Well adult exam   [facility] Alba Cory, MD   2 months ago OSA on CPAP   [facility] Alba Cory, MD   4 months ago Otalgia of both ears   [facility] Danelle Berry, [place]   7 months ago Hypertension associated with type 2 diabetes mellitus [facility])   [facility] Alba Cory, MD       Future Appointments             In 2 months Carlynn Purl, Danna Hefty, MD [facility]   In 10 months Alba Cory, MD [facility]

## 2023-07-20 ENCOUNTER — Ambulatory Visit: Payer: BC Managed Care – PPO | Admitting: Family Medicine

## 2023-07-20 ENCOUNTER — Encounter: Payer: Self-pay | Admitting: Family Medicine

## 2023-07-20 VITALS — BP 124/82 | HR 91 | Temp 98.2°F | Resp 16 | Ht 70.5 in | Wt 288.0 lb

## 2023-07-20 DIAGNOSIS — M25562 Pain in left knee: Secondary | ICD-10-CM | POA: Diagnosis not present

## 2023-07-20 MED ORDER — MELOXICAM 15 MG PO TABS
15.0000 mg | ORAL_TABLET | Freq: Every day | ORAL | 2 refills | Status: DC
Start: 2023-07-20 — End: 2024-02-21

## 2023-07-20 NOTE — Patient Instructions (Signed)
[facility] [address] [place], [place] [address] Phone: [phone] [email or web address]   

## 2023-07-20 NOTE — Progress Notes (Signed)
Patient ID: Clarence Black, male    DOB: 12-Oct-1966, 57 y.o.   MRN: [id number]  PCP: Alba Cory, MD  Chief Complaint  Patient presents with   Knee Pain    Left knee on going for a while had already seen PCP for same reason. Pain more consistent while being active, has to go buy a brace to compress    Subjective:   Clarence Black is a 57 y.o. male, presents to clinic with CC of the following:  HPI  Left knee pain onset early June with injury, has not improved after  conservative measures (resting, ice celebrex) He was walking down an incline yesterday and it jolted and hurt again - pain to medial knee, has felt a little unstable They saw his PCP for past visit, here for further eval and would like to improve pain. He denies bruising, swelling/effusion, crepitus.  No past xrays Pain to medial knee     Patient Active Problem List   Diagnosis Date Noted   Hypertension associated with type 2 diabetes mellitus (HCC) 06/10/2021   Dyslipidemia due to type 2 diabetes mellitus (HCC) 06/10/2021   Diabetes mellitus type 2 in obese 06/10/2021   BPH with obstruction/lower urinary tract symptoms 04/29/2020   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) 08/06/2019   Vitamin D deficiency 01/29/2019   OSA on CPAP 09/29/2018   Diastasis recti 12/31/2016   Low testosterone in male 10/22/2016   Hyperlipidemia LDL goal <70 06/13/2015   Hypertension goal BP (blood pressure) < 130/80 06/13/2015   ED (erectile dysfunction) of organic origin 02/20/2015   Obesity, Class III, BMI 40-49.9 (morbid obesity) (HCC) 02/20/2015      Current Outpatient Medications:    brimonidine (ALPHAGAN) 0.15 % ophthalmic solution, Place 1 drop into the right eye daily., Disp: , Rfl:    busPIRone (BUSPAR) 5 MG tablet, TAKE 1 TABLET BY MOUTH TWICE A DAY AS NEEDED, Disp: 60 tablet, Rfl: 0   celecoxib (CELEBREX) 100 MG capsule, Take 1 capsule (100 mg total) by mouth 2  (two) times daily., Disp: 60 capsule, Rfl: 1   Continuous Blood Gluc Sensor (FREESTYLE LIBRE 3 SENSOR) MISC, USE 1 EACH EVERY 14 (FOURTEEN) DAYS, Disp: , Rfl:    Dapagliflozin-metFORMIN HCl ER (XIGDUO XR) 04-999 MG TB24, Take 2 tablets by mouth daily., Disp: , Rfl:    glipiZIDE (GLUCOTROL XL) 10 MG 24 hr tablet, Take 1 tablet by mouth daily., Disp: , Rfl:    glucose blood (ONE TOUCH ULTRA TEST) test strip, Use as instructed, Disp: 300 each, Rfl: 3   hydrALAZINE (APRESOLINE) 10 MG tablet, TAKE 1 TABLET (10 MG TOTAL) BY MOUTH DAILY AS NEEDED WHEN BLOOD PRESSURE ABOVE 145/90, Disp: 90 tablet, Rfl: 0   levocetirizine (XYZAL) 5 MG tablet, TAKE 1 TABLET BY MOUTH EVERY DAY IN THE EVENING, Disp: 90 tablet, Rfl: 1   modafinil (PROVIGIL) 200 MG tablet, Take 1 tablet (200 mg total) by mouth daily., Disp: 90 tablet, Rfl: 0   Nebivolol HCl 20 MG TABS, Take 1 tablet (20 mg total) by mouth daily. In place of metoprolol, Disp: 90 tablet, Rfl: 1   olmesartan-hydrochlorothiazide (BENICAR HCT) 40-25 MG tablet, TAKE 1 TABLET BY MOUTH EVERY DAY, Disp: 90 tablet, Rfl: 0   ONETOUCH DELICA LANCETS FINE MISC, 1 Device by Does not apply route 3 (three) times daily., Disp: 300 each, Rfl: 3   OZEMPIC, 2 MG/DOSE, 8 MG/3ML SOPN, Inject 2 mg into the skin once a  week., Disp: , Rfl:    rosuvastatin (CRESTOR) 40 MG tablet, Take 1 tablet (40 mg total) by mouth daily., Disp: 90 tablet, Rfl: 1   tadalafil (CIALIS) 20 MG tablet, Take 1 tablet (20 mg total) by mouth every three (3) days as needed for erectile dysfunction., Disp: 90 tablet, Rfl: 0   No Known Allergies   Social History   Tobacco Use   Smoking status: Never   Smokeless tobacco: Never  Vaping Use   Vaping status: Never Used  Substance Use Topics   Alcohol use: No    Alcohol/week: 0.0 standard drinks of alcohol   Drug use: No      Chart Review Today: I personally reviewed active problem list, medication list, allergies, family history, social history, health  maintenance, notes from last encounter, lab results, imaging with the patient/caregiver today.   Review of Systems  Constitutional: Negative.   HENT: Negative.    Eyes: Negative.   Respiratory: Negative.    Cardiovascular: Negative.   Gastrointestinal: Negative.   Endocrine: Negative.   Genitourinary: Negative.   Musculoskeletal: Negative.   Skin: Negative.   Allergic/Immunologic: Negative.   Neurological: Negative.   Hematological: Negative.   Psychiatric/Behavioral: Negative.    All other systems reviewed and are negative.      Objective:   Vitals:   07/20/23 1409 07/20/23 1421  BP: (!) 142/84 124/82  Pulse: 91   Resp: 16   Temp: 98.2 F (36.8 C)   TempSrc: Oral   SpO2: 98%   Weight: 288 lb (130.6 kg)   Height: 5' 10.5" (1.791 m)     Body mass index is 40.74 kg/m.  Physical Exam Vitals and nursing note reviewed.  Constitutional:      General: He is not in acute distress.    Appearance: Normal appearance. He is well-developed. He is obese. He is not ill-appearing, toxic-appearing or diaphoretic.  HENT:     Head: Normocephalic and atraumatic.     Nose: Nose normal.  Eyes:     General:        Right eye: No discharge.        Left eye: No discharge.     Conjunctiva/sclera: Conjunctivae normal.  Neck:     Trachea: No tracheal deviation.  Cardiovascular:     Rate and Rhythm: Normal rate and regular rhythm.  Pulmonary:     Effort: Pulmonary effort is normal. No respiratory distress.     Breath sounds: No stridor.  Musculoskeletal:     Left knee: No swelling, deformity, effusion, erythema, bony tenderness or crepitus. Tenderness present over the MCL. No patellar tendon tenderness. Normal pulse.  Skin:    General: Skin is warm and dry.     Findings: No rash.  Neurological:     Mental Status: He is alert.     Motor: No abnormal muscle tone.     Coordination: Coordination normal.     Gait: Gait abnormal (mildly antalgic gait).  Psychiatric:        Behavior:  Behavior normal.      Results for orders placed or performed in visit on 05/13/23  CBC with Differential/Platelet  Result Value Ref Range   WBC 6.3 3.8 - 10.8 Thousand/uL   RBC 5.16 4.20 - 5.80 Million/uL   Hemoglobin 14.5 13.2 - 17.1 g/dL   HCT 60.1 09.3 - 23.5 %   MCV 84.9 80.0 - 100.0 fL   MCH 28.1 27.0 - 33.0 pg   MCHC 33.1 32.0 - 36.0 g/dL  RDW 14.7 11.0 - 15.0 %   Platelets 163 140 - 400 Thousand/uL   MPV 11.8 7.5 - 12.5 fL   Neutro Abs 3,213 1,500 - 7,800 cells/uL   Lymphs Abs 2,407 850 - 3,900 cells/uL   Absolute Monocytes 447 200 - 950 cells/uL   Eosinophils Absolute 202 15 - 500 cells/uL   Basophils Absolute 32 0 - 200 cells/uL   Neutrophils Relative % 51 %   Total Lymphocyte 38.2 %   Monocytes Relative 7.1 %   Eosinophils Relative 3.2 %   Basophils Relative 0.5 %  COMPLETE METABOLIC PANEL WITH GFR  Result Value Ref Range   Glucose, Bld 225 (H) 65 - 99 mg/dL   BUN 15 7 - 25 mg/dL   Creat 6.64 4.03 - 4.74 mg/dL   eGFR 89 > OR = 60 QV/ZDG/3.87F6   BUN/Creatinine Ratio SEE NOTE: 6 - 22 (calc)   Sodium 139 135 - 146 mmol/L   Potassium 3.6 3.5 - 5.3 mmol/L   Chloride 103 98 - 110 mmol/L   CO2 27 20 - 32 mmol/L   Calcium 9.2 8.6 - 10.3 mg/dL   Total Protein 6.5 6.1 - 8.1 g/dL   Albumin 4.3 3.6 - 5.1 g/dL   Globulin 2.2 1.9 - 3.7 g/dL (calc)   AG Ratio 2.0 1.0 - 2.5 (calc)   Total Bilirubin 0.7 0.2 - 1.2 mg/dL   Alkaline phosphatase (APISO) 55 35 - 144 U/L   AST 22 10 - 35 U/L   ALT 23 9 - 46 U/L  PSA  Result Value Ref Range   PSA 0.28 < OR = 4.00 ng/mL  VITAMIN D 25 Hydroxy (Vit-D Deficiency, Fractures)  Result Value Ref Range   Vit D, 25-Hydroxy 29 (L) 30 - 100 ng/mL  B12 and Folate Panel  Result Value Ref Range   Vitamin B-12 647 200 - 1,100 pg/mL   Folate 8.8 ng/mL       Assessment & Plan:     ICD-10-CM   1. Acute pain of left knee  M25.562 Ambulatory referral to Orthopedics    meloxicam (MOBIC) 15 MG tablet     Pain not improved with over 6  weeks of conservative management with celebrex rest and ice per PCP Jarred it yesterday pain is worse today He notes a feeling of instability On exam ttp to MCL  They want imaging - with continued pain I have recommended going to walk in at [facility] for ortho eval and imaging  Work note give to excuse from last night and for the next 2 days to allow time for getting into ortho  He is wearing a thin new compression sleeve - he can continue to wear that, a more substantive brace may be indicated (per ortho)  Plan for meds (stop celebrex, try mobic and tylenol) reviewed with pt and his wife     Danelle Berry, Cordelia Poche 07/20/23 2:33 PM

## 2023-07-24 ENCOUNTER — Other Ambulatory Visit: Payer: Self-pay | Admitting: Family Medicine

## 2023-07-24 DIAGNOSIS — M25562 Pain in left knee: Secondary | ICD-10-CM

## 2023-08-01 ENCOUNTER — Other Ambulatory Visit: Payer: Self-pay | Admitting: Nurse Practitioner

## 2023-08-01 ENCOUNTER — Telehealth: Payer: Self-pay | Admitting: Family Medicine

## 2023-08-01 ENCOUNTER — Other Ambulatory Visit: Payer: Self-pay | Admitting: Family Medicine

## 2023-08-01 DIAGNOSIS — E785 Hyperlipidemia, unspecified: Secondary | ICD-10-CM

## 2023-08-01 DIAGNOSIS — H9203 Otalgia, bilateral: Secondary | ICD-10-CM

## 2023-08-01 DIAGNOSIS — I10 Essential (primary) hypertension: Secondary | ICD-10-CM

## 2023-08-01 NOTE — Telephone Encounter (Signed)
Spoke to pt and he will call Dr Alphonsus Sias office

## 2023-08-01 NOTE — Telephone Encounter (Signed)
Pt is calling in because he believes his patch for testing his blood sugar is defective. Pt says he tested his sugar using a meter and test trip and the reading was 30 points off from the patch's reading. Pt says this is his last patch and wants to know if he could get some more or if he could pick a sample of one up from the office because he believes his patch isn't functioning properly. Pt would like someone to call him back regarding this matter.

## 2023-08-02 NOTE — Telephone Encounter (Signed)
Rx 03/17/23 #90 1RF- too soon Requested Prescriptions  Pending Prescriptions Disp Refills   levocetirizine (XYZAL) 5 MG tablet [Pharmacy Med Name: LEVOCETIRIZINE 5 MG TABLET] 90 tablet 1    Sig: TAKE 1 TABLET BY MOUTH EVERY DAY IN THE EVENING     Ear, Nose, and Throat:  Antihistamines - levocetirizine dihydrochloride Passed - 08/01/2023 12:57 PM      Passed - Cr in normal range and within 360 days    Creat  Date Value Ref Range Status  05/13/2023 1.00 0.70 - 1.30 mg/dL Final   Creatinine, Urine  Date Value Ref Range Status  02/17/2023 91.7  Final         Passed - eGFR is 10 or above and within 360 days    GFR, Est African American  Date Value Ref Range Status  03/09/2021 95 > OR = 60 mL/min/1.35m2 Final   GFR, Est Non African American  Date Value Ref Range Status  03/09/2021 82 > OR = 60 mL/min/1.32m2 Final   eGFR  Date Value Ref Range Status  05/13/2023 89 > OR = 60 mL/min/1.92m2 Final         Passed - Valid encounter within last 12 months    Recent Outpatient Visits           1 week ago Acute pain of left knee   [facility] Danelle Berry, PA-C   2 months ago Acute pain of left knee   [facility] Alba Cory, MD   2 months ago Well adult exam   [facility] Alba Cory, MD   3 months ago OSA on CPAP   [facility] Alba Cory, MD   5 months ago Otalgia of both ears   [facility] Danelle Berry, [place]       Future Appointments             In 1 month Alba Cory, MD [facility], [facility]   In 9 months Alba Cory, MD [facility], [facility]

## 2023-08-19 ENCOUNTER — Encounter (INDEPENDENT_AMBULATORY_CARE_PROVIDER_SITE_OTHER): Payer: BC Managed Care – PPO | Admitting: Ophthalmology

## 2023-08-19 DIAGNOSIS — I10 Essential (primary) hypertension: Secondary | ICD-10-CM

## 2023-08-19 DIAGNOSIS — H35033 Hypertensive retinopathy, bilateral: Secondary | ICD-10-CM

## 2023-08-19 DIAGNOSIS — Z7984 Long term (current) use of oral hypoglycemic drugs: Secondary | ICD-10-CM | POA: Diagnosis not present

## 2023-08-19 DIAGNOSIS — H43813 Vitreous degeneration, bilateral: Secondary | ICD-10-CM

## 2023-08-19 DIAGNOSIS — E113392 Type 2 diabetes mellitus with moderate nonproliferative diabetic retinopathy without macular edema, left eye: Secondary | ICD-10-CM

## 2023-08-19 DIAGNOSIS — Z7985 Long-term (current) use of injectable non-insulin antidiabetic drugs: Secondary | ICD-10-CM | POA: Diagnosis not present

## 2023-08-19 DIAGNOSIS — E113291 Type 2 diabetes mellitus with mild nonproliferative diabetic retinopathy without macular edema, right eye: Secondary | ICD-10-CM

## 2023-08-19 DIAGNOSIS — H2513 Age-related nuclear cataract, bilateral: Secondary | ICD-10-CM

## 2023-09-09 NOTE — Progress Notes (Unsigned)
Name: Clarence Black   MRN: [id number]    DOB: Oct 20, 1966   Date:09/12/2023       Progress Note  Subjective  Chief Complaint  Follow up//Paperwork completion  HPI  HTN: he has been taking medications , Bystolic, Benicar/hctz and Hydralazine  prn for elevated bp.   Denies chest pain or palpitation. No dizziness.  BP is slightly up he has hydralazine at home that he takes seldom, usually before DOT exam since he gets anxious  Umbilical hernia: he saw surgeon and was advised to lose weight first . Unchanged  DMII: he sees Dr. Tedd Sias, no recent episodes of hypoglycemia. He has retinopathy and sees Dr. Anastasio Auerbach , A1C predictecd on his FreeStyle [place] monitor is 6.2 % in the past 3 months . Currently on Tresiba 50 units, glipizide, Xigduo and Ozempic 2 mg, he is having 3-4 episodes of hypoglycemia per week - down 50's range, meter warns him and he eats something.    He is on ARB and SGL-2 agonist for microalbuminuria , on statin therapy for hyperlipidemia. Marland Kitchen He denies polyphagia, polydipsia or polyuria. LDL is under control .  Obesity: BMI above 40, he is a truck driver, reviewed importance of life style modification , he will resume Ozempic this week, weight went up while on Trulicity, also had some knee pain after stepping on hole and has not been as active lately.   Anxiety: he states usually takes Buspar prn  only, gets nervous before DOT and bp spikes otherwise doing well .   OSA: he wears his CPAP 5- days per week,  Modafinil helps him stay awake. 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 No chest  pain, palpitation, or decreased in SOB   BPH: he has some dribbling at the end of micturition, he also has ED and is takign cialis Unchanged  Knee pain left: after a hyperextension injury, saw Ortho, had steroid injection back in July and is asymptomatic now, he is taking meloxicam daily, but will try to wean self off   [facility] paperwork filled out today   Patient Active Problem List   Diagnosis Date Noted   Hypertension associated with type 2 diabetes mellitus (HCC) 06/10/2021   Dyslipidemia due to type 2 diabetes mellitus (HCC) 06/10/2021   Diabetes mellitus type 2 in obese 06/10/2021   BPH with obstruction/lower urinary tract symptoms 04/29/2020   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) 08/06/2019   Vitamin D deficiency 01/29/2019   OSA on CPAP 09/29/2018   Diastasis recti 12/31/2016   Low testosterone in male 10/22/2016   Hyperlipidemia LDL goal <70 06/13/2015   Hypertension goal BP (blood pressure) < 130/80 06/13/2015   ED (erectile dysfunction) of organic origin 02/20/2015   Obesity, Class III, BMI 40-49.9 (morbid obesity) (HCC) 02/20/2015    Past Surgical History:  Procedure Laterality Date   COLONOSCOPY  2013   COLONOSCOPY WITH PROPOFOL N/A 06/02/2018   Procedure: COLONOSCOPY WITH PROPOFOL;  Surgeon: Wyline Mood, MD;  Location: [facility] ENDOSCOPY;  Service:  Gastroenterology;  Laterality: N/A;    Family History  Problem Relation Age of Onset   Diabetes Mother    Kidney disease Mother    Diabetes Brother    Diabetes Brother    Diabetes Maternal Grandfather    Hypertension Maternal Grandfather    Stroke Maternal Grandfather     Social History   Tobacco Use   Smoking status: Never   Smokeless tobacco: Never  Substance Use Topics   Alcohol use: No    Alcohol/week: 0.0 standard drinks of alcohol     Current Outpatient Medications:    BD PEN NEEDLE NANO 2ND GEN 32G X 4 MM MISC, See admin instructions., Disp: , Rfl:     brimonidine (ALPHAGAN) 0.15 % ophthalmic solution, Place 1 drop into the right eye daily., Disp: , Rfl:    Continuous Blood Gluc Sensor (FREESTYLE LIBRE 3 SENSOR) MISC, USE 1 EACH EVERY 14 (FOURTEEN) DAYS, Disp: , Rfl:    Dapagliflozin-metFORMIN HCl ER (XIGDUO XR) 04-999 MG TB24, Take 2 tablets by mouth daily., Disp: , Rfl:    glipiZIDE (GLUCOTROL XL) 10 MG 24 hr tablet, Take 1 tablet by mouth daily., Disp: , Rfl:    glucose blood (ONE TOUCH ULTRA TEST) test strip, Use as instructed, Disp: 300 each, Rfl: 3   hydrALAZINE (APRESOLINE) 10 MG tablet, TAKE 1 TABLET (10 MG TOTAL) BY MOUTH DAILY AS NEEDED WHEN BLOOD PRESSURE ABOVE 145/90, Disp: 90 tablet, Rfl: 0   levocetirizine (XYZAL) 5 MG tablet, TAKE 1 TABLET BY MOUTH EVERY DAY IN THE EVENING, Disp: 90 tablet, Rfl: 1   meloxicam (MOBIC) 15 MG tablet, Take 1 tablet (15 mg total) by mouth daily., Disp: 30 tablet, Rfl: 2   Nebivolol HCl 20 MG TABS, Take 1 tablet (20 mg total) by mouth daily. In place of metoprolol, Disp: 90 tablet, Rfl: 1   olmesartan-hydrochlorothiazide (BENICAR HCT) 40-25 MG tablet, TAKE 1 TABLET BY MOUTH EVERY DAY, Disp: 90 tablet, Rfl: 0   ONETOUCH DELICA LANCETS FINE MISC, 1 Device by Does not apply route 3 (three) times daily., Disp: 300 each, Rfl: 3   OZEMPIC, 2 MG/DOSE, 8 MG/3ML SOPN, Inject 2 mg into the skin once a week., Disp: , Rfl:    rosuvastatin (CRESTOR) 40 MG tablet, TAKE 1 TABLET BY MOUTH EVERY DAY, Disp: 90 tablet, Rfl: 1   tadalafil (CIALIS) 20 MG tablet, Take 1 tablet (20 mg total) by mouth every three (3) days as needed for erectile dysfunction., Disp: 90 tablet, Rfl: 0   TRESIBA FLEXTOUCH 100 UNIT/ML FlexTouch Pen, INJECT UP TO 50 UNITS DAILY AS DIRECTED, Disp: , Rfl:    modafinil (PROVIGIL) 200 MG tablet, Take 1 tablet (200 mg total) by mouth daily., Disp: 90 tablet, Rfl: 0  No Known Allergies  I personally reviewed active problem list, medication list, allergies, family history, social history with the  patient/caregiver today.   ROS  Constitutional: Negative for fever or weight change.  Respiratory: Negative for cough and shortness of breath.   Cardiovascular: Negative for chest pain or palpitations.  Gastrointestinal: Negative for abdominal pain, no bowel changes.  Musculoskeletal: Negative for gait problem or joint swelling.  Skin: Negative for rash.  Neurological: Negative for dizziness or headache.  No other specific complaints in a complete review of systems (except as listed in HPI above).   Objective  Vitals:   09/12/23 1433  BP: 138/82  Pulse: 81  Resp: 18  Temp: 97.9 F (36.6 C)  TempSrc: Oral  SpO2: 99%  Weight:  295 lb 12.8 oz (134.2 kg)  Height: 5\' 10"  (1.778 m)    Body mass index is 42.44 kg/m.  Physical Exam  Constitutional: Patient appears well-developed and well-nourished. Obese  No distress.  HEENT: head atraumatic, normocephalic, pupils equal and reactive to light, neck supple Cardiovascular: Normal rate, regular rhythm and normal heart sounds.  No murmur heard. No BLE edema. Pulmonary/Chest: Effort normal and breath sounds normal. No respiratory distress. Abdominal: Soft.  There is no tenderness. Psychiatric: Patient has a normal mood and affect. behavior is normal. Judgment and thought content normal.   PHQ2/9:    09/12/2023    2:36 PM 07/20/2023    2:09 PM 05/13/2023    3:29 PM 04/11/2023    2:51 PM 02/14/2023    1:08 PM  Depression screen PHQ 2/9  Decreased Interest 0 0 0 0 0  Down, Depressed, Hopeless 0 0 0 0 0  PHQ - 2 Score 0 0 0 0 0  Altered sleeping 0 0 0 0 0  Tired, decreased energy 0 0 0 0 0  Change in appetite 0 0 0 0 0  Feeling bad or failure about yourself  0 0 0 0 0  Trouble concentrating 0 0 0 0 0  Moving slowly or fidgety/restless 0 0 0 0 0  Suicidal thoughts 0 0 0 0 0  PHQ-9 Score 0 0 0 0 0  Difficult doing work/chores  Not difficult at all   Not difficult at all    phq 9 is negative   Fall Risk:    09/12/2023    2:36  PM 07/20/2023    2:08 PM 05/31/2023    2:18 PM 05/13/2023    3:29 PM 04/11/2023    2:51 PM  Fall Risk   Falls in the past year? 0 0 0 0 0  Number falls in past yr:  0  0 0  Injury with Fall?  0  0 0  Risk for fall due to : No Fall Risks No Fall Risks Orthopedic patient No Fall Risks No Fall Risks  Follow up Falls prevention discussed Falls prevention discussed;Education provided;Falls evaluation completed Falls prevention discussed;Education provided;Falls evaluation completed Falls prevention discussed Falls prevention discussed     Functional Status Survey: Is the patient deaf or have difficulty hearing?: No Does the patient have difficulty seeing, even when wearing glasses/contacts?: No Does the patient have difficulty concentrating, remembering, or making decisions?: No Does the patient have difficulty walking or climbing stairs?: Yes Does the patient have difficulty dressing or bathing?: No Does the patient have difficulty doing errands alone such as visiting a doctor's office or shopping?: No    Assessment & Plan  1. Hypertension associated with type 2 diabetes mellitus (HCC)  Continue medications  2. Shift work sleep disorder  - modafinil (PROVIGIL) 200 MG tablet; Take 1 tablet (200 mg total) by mouth daily.  Dispense: 90 tablet; Refill: 0  3. Hypertension goal BP (blood pressure) < 130/80  - Nebivolol HCl 20 MG TABS; Take 1 tablet (20 mg total) by mouth daily. In place of metoprolol  Dispense: 90 tablet; Refill: 1 - olmesartan-hydrochlorothiazide (BENICAR HCT) 40-25 MG tablet; Take 1 tablet by mouth daily.  Dispense: 90 tablet; Refill: 0  4. ED (erectile dysfunction) of organic origin  - tadalafil (CIALIS) 20 MG tablet; Take 1 tablet (20 mg total) by mouth every three (3) days as needed for erectile dysfunction.  Dispense: 90 tablet; Refill: 0  5. OSA on CPAP  Must increase compliant  6. Need for immunization against influenza  - Flu vaccine trivalent PF, 6mos and  older(Flulaval,Afluria,Fluarix,Fluzone)  7. Morbid obesity (HCC)  Discussed with the patient the risk posed by an increased BMI. Discussed importance of portion control, calorie counting and at least 150 minutes of physical activity weekly. Avoid sweet beverages and drink more water. Eat at least 6 servings of fruit and vegetables daily

## 2023-09-12 ENCOUNTER — Encounter: Payer: Self-pay | Admitting: Family Medicine

## 2023-09-12 ENCOUNTER — Ambulatory Visit: Payer: BC Managed Care – PPO | Admitting: Family Medicine

## 2023-09-12 ENCOUNTER — Telehealth: Payer: Self-pay | Admitting: Family Medicine

## 2023-09-12 VITALS — BP 138/82 | HR 81 | Temp 97.9°F | Resp 18 | Ht 70.0 in | Wt 295.8 lb

## 2023-09-12 DIAGNOSIS — Z23 Encounter for immunization: Secondary | ICD-10-CM

## 2023-09-12 DIAGNOSIS — I10 Essential (primary) hypertension: Secondary | ICD-10-CM

## 2023-09-12 DIAGNOSIS — E1159 Type 2 diabetes mellitus with other circulatory complications: Secondary | ICD-10-CM

## 2023-09-12 DIAGNOSIS — Z7985 Long-term (current) use of injectable non-insulin antidiabetic drugs: Secondary | ICD-10-CM

## 2023-09-12 DIAGNOSIS — N529 Male erectile dysfunction, unspecified: Secondary | ICD-10-CM

## 2023-09-12 DIAGNOSIS — I152 Hypertension secondary to endocrine disorders: Secondary | ICD-10-CM

## 2023-09-12 DIAGNOSIS — G4726 Circadian rhythm sleep disorder, shift work type: Secondary | ICD-10-CM | POA: Diagnosis not present

## 2023-09-12 DIAGNOSIS — G4733 Obstructive sleep apnea (adult) (pediatric): Secondary | ICD-10-CM

## 2023-09-12 LAB — MICROALBUMIN / CREATININE URINE RATIO: Microalb Creat Ratio: 54.9

## 2023-09-12 MED ORDER — NEBIVOLOL HCL 20 MG PO TABS
20.0000 mg | ORAL_TABLET | Freq: Every day | ORAL | 1 refills | Status: DC
Start: 2023-09-12 — End: 2024-03-12

## 2023-09-12 MED ORDER — MODAFINIL 200 MG PO TABS
200.0000 mg | ORAL_TABLET | Freq: Every day | ORAL | 0 refills | Status: DC
Start: 2023-09-12 — End: 2024-03-12

## 2023-09-12 MED ORDER — OLMESARTAN MEDOXOMIL-HCTZ 40-25 MG PO TABS
1.0000 | ORAL_TABLET | Freq: Every day | ORAL | 0 refills | Status: DC
Start: 2023-09-12 — End: 2024-01-30

## 2023-09-12 MED ORDER — TADALAFIL 20 MG PO TABS: 20.0000 mg | ORAL_TABLET | ORAL | 0 refills | Status: DC | PRN

## 2023-09-12 NOTE — Telephone Encounter (Signed)
Called wife back and answered all questions. Wife was appreciative.

## 2023-09-12 NOTE — Telephone Encounter (Unsigned)
Copied from CRM [id number]. Topic: General - Other >> Sep 12, 2023  4:13 PM Clarence Black wrote: Reason for CRM: The patient's wife would like speak with a member staff about their recent visit and the information related to it   The patient's wife would like to clarity related to their after visit summary   Please contact further when possible

## 2023-09-19 NOTE — Telephone Encounter (Signed)
Copied from CRM [id number]. Topic: General - Other >> Sep 19, 2023 11:55 AM Franchot Heidelberg wrote: Reason for CRM: Chronic ongoing medical conditions that need more elaboration. The form is filled out with medical abbreviations but they need actual full names of diagnosis and also how it is being treated and any medicine involved.   Best contact: [phone]  Requesting a call back

## 2023-09-20 NOTE — Telephone Encounter (Signed)
Pt's wife called in checking status of info requested, about, Chronic ongoing medical conditions that need more elaboration. The form is filled out with medical abbreviations but they need actual full names of diagnosis and also how it is being treated and any medicine involved.  I let her know of 48-72 for turn around for cb

## 2023-09-21 ENCOUNTER — Other Ambulatory Visit: Payer: Self-pay | Admitting: Family Medicine

## 2023-09-21 DIAGNOSIS — N529 Male erectile dysfunction, unspecified: Secondary | ICD-10-CM

## 2023-09-21 MED ORDER — TADALAFIL 20 MG PO TABS: 20.0000 mg | ORAL_TABLET | ORAL | 0 refills | Status: DC | PRN

## 2023-09-21 NOTE — Telephone Encounter (Signed)
Called patient and he stated he will drop the forms back off for [place] to elaborate.

## 2023-10-16 ENCOUNTER — Other Ambulatory Visit: Payer: Self-pay | Admitting: Family Medicine

## 2023-10-16 DIAGNOSIS — M25562 Pain in left knee: Secondary | ICD-10-CM

## 2023-11-19 ENCOUNTER — Other Ambulatory Visit: Payer: Self-pay | Admitting: Family Medicine

## 2023-11-19 ENCOUNTER — Other Ambulatory Visit: Payer: Self-pay | Admitting: Nurse Practitioner

## 2023-11-19 DIAGNOSIS — M25562 Pain in left knee: Secondary | ICD-10-CM

## 2023-11-19 DIAGNOSIS — I10 Essential (primary) hypertension: Secondary | ICD-10-CM

## 2023-11-19 DIAGNOSIS — H9203 Otalgia, bilateral: Secondary | ICD-10-CM

## 2023-11-21 NOTE — Telephone Encounter (Signed)
Requested Prescriptions  Pending Prescriptions Disp Refills   levocetirizine (XYZAL) 5 MG tablet [Pharmacy Med Name: LEVOCETIRIZINE 5 MG TABLET] 90 tablet 1    Sig: TAKE 1 TABLET BY MOUTH EVERY DAY IN THE EVENING     Ear, Nose, and Throat:  Antihistamines - levocetirizine dihydrochloride Passed - 11/19/2023  9:18 AM      Passed - Cr in normal range and within 360 days    Creat  Date Value Ref Range Status  05/13/2023 1.00 0.70 - 1.30 mg/dL Final   Creatinine, Urine  Date Value Ref Range Status  02/17/2023 91.7  Final         Passed - eGFR is 10 or above and within 360 days    GFR, Est African American  Date Value Ref Range Status  03/09/2021 95 > OR = 60 mL/min/1.31m2 Final   GFR, Est Non African American  Date Value Ref Range Status  03/09/2021 82 > OR = 60 mL/min/1.99m2 Final   eGFR  Date Value Ref Range Status  05/13/2023 89 > OR = 60 mL/min/1.62m2 Final         Passed - Valid encounter within last 12 months    Recent Outpatient Visits           2 months ago Hypertension goal BP (blood pressure) < 130/80   [facility] Alba Cory, MD   4 months ago Acute pain of left knee   [facility] Danelle Berry, PA-C   5 months ago Acute pain of left knee   [facility] Alba Cory, MD   6 months ago Well adult exam   [facility] Alba Cory, MD   7 months ago OSA on CPAP   [facility] Alba Cory, MD       Future Appointments             In 3 months Carlynn Purl, Danna Hefty, MD [facility]   In 5 months Alba Cory, MD [facility], [facility]

## 2024-01-06 ENCOUNTER — Encounter (INDEPENDENT_AMBULATORY_CARE_PROVIDER_SITE_OTHER): Payer: BC Managed Care – PPO | Admitting: Ophthalmology

## 2024-01-06 DIAGNOSIS — E113392 Type 2 diabetes mellitus with moderate nonproliferative diabetic retinopathy without macular edema, left eye: Secondary | ICD-10-CM | POA: Diagnosis not present

## 2024-01-06 DIAGNOSIS — E113291 Type 2 diabetes mellitus with mild nonproliferative diabetic retinopathy without macular edema, right eye: Secondary | ICD-10-CM | POA: Diagnosis not present

## 2024-01-06 DIAGNOSIS — H2513 Age-related nuclear cataract, bilateral: Secondary | ICD-10-CM

## 2024-01-06 DIAGNOSIS — I10 Essential (primary) hypertension: Secondary | ICD-10-CM

## 2024-01-06 DIAGNOSIS — Z7984 Long term (current) use of oral hypoglycemic drugs: Secondary | ICD-10-CM | POA: Diagnosis not present

## 2024-01-06 DIAGNOSIS — Z7985 Long-term (current) use of injectable non-insulin antidiabetic drugs: Secondary | ICD-10-CM | POA: Diagnosis not present

## 2024-01-06 DIAGNOSIS — H43813 Vitreous degeneration, bilateral: Secondary | ICD-10-CM

## 2024-01-06 DIAGNOSIS — H35033 Hypertensive retinopathy, bilateral: Secondary | ICD-10-CM

## 2024-01-24 LAB — HEMOGLOBIN A1C: Hemoglobin A1C: 7

## 2024-01-24 LAB — HM DIABETES FOOT EXAM

## 2024-01-28 ENCOUNTER — Other Ambulatory Visit: Payer: Self-pay | Admitting: Family Medicine

## 2024-01-28 DIAGNOSIS — I10 Essential (primary) hypertension: Secondary | ICD-10-CM

## 2024-01-30 NOTE — Telephone Encounter (Signed)
Last f/u 09/12/23

## 2024-02-20 ENCOUNTER — Other Ambulatory Visit: Payer: Self-pay | Admitting: Family Medicine

## 2024-02-20 DIAGNOSIS — M25562 Pain in left knee: Secondary | ICD-10-CM

## 2024-02-20 DIAGNOSIS — E785 Hyperlipidemia, unspecified: Secondary | ICD-10-CM

## 2024-02-23 ENCOUNTER — Other Ambulatory Visit: Payer: Self-pay | Admitting: Family Medicine

## 2024-02-23 DIAGNOSIS — I10 Essential (primary) hypertension: Secondary | ICD-10-CM

## 2024-02-23 NOTE — Telephone Encounter (Signed)
 Last f/u 10/2023

## 2024-03-12 ENCOUNTER — Ambulatory Visit: Payer: BC Managed Care – PPO | Admitting: Family Medicine

## 2024-03-12 ENCOUNTER — Encounter: Payer: Self-pay | Admitting: Family Medicine

## 2024-03-12 VITALS — BP 136/82 | HR 92 | Resp 16 | Ht 70.0 in | Wt 291.9 lb

## 2024-03-12 DIAGNOSIS — I10 Essential (primary) hypertension: Secondary | ICD-10-CM

## 2024-03-12 DIAGNOSIS — E1159 Type 2 diabetes mellitus with other circulatory complications: Secondary | ICD-10-CM

## 2024-03-12 DIAGNOSIS — Z7985 Long-term (current) use of injectable non-insulin antidiabetic drugs: Secondary | ICD-10-CM

## 2024-03-12 DIAGNOSIS — E785 Hyperlipidemia, unspecified: Secondary | ICD-10-CM

## 2024-03-12 DIAGNOSIS — N400 Enlarged prostate without lower urinary tract symptoms: Secondary | ICD-10-CM

## 2024-03-12 DIAGNOSIS — Z23 Encounter for immunization: Secondary | ICD-10-CM

## 2024-03-12 DIAGNOSIS — G4726 Circadian rhythm sleep disorder, shift work type: Secondary | ICD-10-CM

## 2024-03-12 DIAGNOSIS — G4733 Obstructive sleep apnea (adult) (pediatric): Secondary | ICD-10-CM

## 2024-03-12 DIAGNOSIS — N529 Male erectile dysfunction, unspecified: Secondary | ICD-10-CM

## 2024-03-12 DIAGNOSIS — I152 Hypertension secondary to endocrine disorders: Secondary | ICD-10-CM

## 2024-03-12 MED ORDER — TADALAFIL 20 MG PO TABS
20.0000 mg | ORAL_TABLET | ORAL | 0 refills | Status: DC | PRN
Start: 2024-03-12 — End: 2024-09-12

## 2024-03-12 MED ORDER — MODAFINIL 200 MG PO TABS: 200.0000 mg | ORAL_TABLET | Freq: Every day | ORAL | 1 refills | Status: DC

## 2024-03-12 MED ORDER — OLMESARTAN MEDOXOMIL-HCTZ 40-25 MG PO TABS: 1.0000 | ORAL_TABLET | Freq: Every day | ORAL | 0 refills | Status: DC

## 2024-03-12 MED ORDER — NEBIVOLOL HCL 20 MG PO TABS: 20.0000 mg | ORAL_TABLET | Freq: Two times a day (BID) | ORAL | 1 refills | Status: DC

## 2024-03-12 MED ORDER — ROSUVASTATIN CALCIUM 40 MG PO TABS
40.0000 mg | ORAL_TABLET | Freq: Every day | ORAL | 1 refills | Status: DC
Start: 2024-03-12 — End: 2024-09-12

## 2024-03-12 NOTE — Progress Notes (Signed)
 Name: Clarence Black   MRN: [id number]    DOB: 08-31-1966   Date:03/12/2024       Progress Note  Subjective  Chief Complaint  Chief Complaint  Patient presents with   Medical Management of Chronic Issues   HPI    HTN: he has been taking medications , Bystolic, Benicar/hctz but bp is not great, we will adjust dose of bystolic to 20 mg BID , continue Benicar hydrochlorothiazide 40/25 and hydralazine prn. No chest pain or palpitation.    Umbilical hernia: he saw surgeon and was advised to lose weight first . Unchanged   DMII: he sees Dr. Tedd Sias, A1C is at goal. He has history of  retinopathy and sees Dr. Anastasio Auerbach , doing well lately. He also takes ARB for HTN, statin for dyslipidemia, he has associated obesity and is on GLP-1 agonist. He denies polyphagia, polydipsia or polyuria    Morbid Obesity: BMI above 40, he is a truck driver, reviewed importance of life style modification , he is now on Ozempic and off [place], his weight is slightly lower than one year ago. He states he will try to increase his physical activity    OSA: he wears his CPAP 5- days per week,  Modafinil helps him stay awake and he does not need to drink an energy drink when he takes medication. He is aware it is a controlled medication. 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 We will recheck during CPE    Family history of heart disease: he also has DM, HTN and obesity, Mother, grandmother , grandfather, one brother died at age 36 from heart attack and has a living brother that has heart disease He sees cardiologist once a year. He is symptom free, he is taking aspirin and also Rosuvastatin    BPH: he has some dribbling at the end of micturition, he also has ED and is takign cialis Stable   Knee pain left: after a hyperextension injury, saw Ortho, had  steroid injection back in July and is asymptomatic now, he is taking meloxicam das needed now, discussed tylenol is safe can take 500 mg four times a week      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) 08/06/2019   Vitamin D deficiency 01/29/2019   OSA on CPAP 09/29/2018   Diastasis recti 12/31/2016   Low testosterone in male 10/22/2016   Hyperlipidemia LDL goal <70 06/13/2015   Hypertension goal BP (blood pressure) < 130/80 06/13/2015   ED (erectile dysfunction) of organic origin 02/20/2015   Obesity, Class III, BMI 40-49.9 (morbid obesity) (HCC) 02/20/2015    Past Surgical History:  Procedure Laterality Date   COLONOSCOPY  2013   COLONOSCOPY WITH PROPOFOL N/A 06/02/2018   Procedure: COLONOSCOPY WITH PROPOFOL;  Surgeon: Wyline Mood, MD;  Location: [facility] ENDOSCOPY;  Service: Gastroenterology;  Laterality: N/A;    Family History  Problem Relation Age of Onset   Diabetes Mother    Kidney  disease Mother    Diabetes Brother    Diabetes Brother    Diabetes Maternal Grandfather    Hypertension Maternal Grandfather    Stroke Maternal Grandfather     Social History   Tobacco Use   Smoking status: Never   Smokeless tobacco: Never  Substance Use Topics   Alcohol use: No    Alcohol/week: 0.0 standard drinks of alcohol     Current Outpatient Medications:    BD PEN NEEDLE NANO 2ND GEN 32G X 4 MM MISC, See admin instructions., Disp: , Rfl:    brimonidine (ALPHAGAN) 0.15 % ophthalmic solution, Place 1 drop into the right eye daily., Disp: , Rfl:    Continuous Blood Gluc Sensor (FREESTYLE LIBRE 3 SENSOR) MISC, USE 1 EACH EVERY 14  (FOURTEEN) DAYS, Disp: , Rfl:    Dapagliflozin-metFORMIN HCl ER (XIGDUO XR) 04-999 MG TB24, Take 2 tablets by mouth daily., Disp: , Rfl:    glipiZIDE (GLUCOTROL XL) 10 MG 24 hr tablet, Take 1 tablet by mouth daily., Disp: , Rfl:    glucose blood (ONE TOUCH ULTRA TEST) test strip, Use as instructed, Disp: 300 each, Rfl: 3   hydrALAZINE (APRESOLINE) 10 MG tablet, TAKE 1 TABLET (10 MG TOTAL) BY MOUTH DAILY AS NEEDED WHEN BLOOD PRESSURE ABOVE 145/90, Disp: 30 tablet, Rfl: 0   levocetirizine (XYZAL) 5 MG tablet, TAKE 1 TABLET BY MOUTH EVERY DAY IN THE EVENING, Disp: 90 tablet, Rfl: 1   meloxicam (MOBIC) 15 MG tablet, TAKE 1 TABLET (15 MG TOTAL) BY MOUTH DAILY., Disp: 30 tablet, Rfl: 0   modafinil (PROVIGIL) 200 MG tablet, Take 1 tablet (200 mg total) by mouth daily., Disp: 90 tablet, Rfl: 0   Nebivolol HCl 20 MG TABS, Take 1 tablet (20 mg total) by mouth daily. In place of metoprolol, Disp: 90 tablet, Rfl: 1   olmesartan-hydrochlorothiazide (BENICAR HCT) 40-25 MG tablet, TAKE 1 TABLET BY MOUTH EVERY DAY, Disp: 90 tablet, Rfl: 0   ONETOUCH DELICA LANCETS FINE MISC, 1 Device by Does not apply route 3 (three) times daily., Disp: 300 each, Rfl: 3   OZEMPIC, 2 MG/DOSE, 8 MG/3ML SOPN, Inject 2 mg into the skin once a week., Disp: , Rfl:    rosuvastatin (CRESTOR) 40 MG tablet, TAKE 1 TABLET BY MOUTH EVERY DAY, Disp: 90 tablet, Rfl: 1   tadalafil (CIALIS) 20 MG tablet, Take 1 tablet (20 mg total) by mouth every three (3) days as needed for erectile dysfunction., Disp: 90 tablet, Rfl: 0   TRESIBA FLEXTOUCH 100 UNIT/ML FlexTouch Pen, INJECT UP TO 50 UNITS DAILY AS DIRECTED, Disp: , Rfl:   No Known Allergies  I personally reviewed active problem list, medication list, allergies, family history with the patient/caregiver today.   ROS  Ten systems reviewed and is negative except as mentioned in HPI    Objective  Vitals:   03/12/24 1447  BP: 136/82  Pulse: 92  Resp: 16  SpO2: 98%  Weight: 291 lb 14.4  oz (132.4 kg)  Height: 5\' 10"  (1.778 m)    Body mass index is 41.88 kg/m.  Physical Exam  Constitutional: Patient appears well-developed and well-nourished. Obese  No distress.  HEENT: head atraumatic, normocephalic, pupils equal and reactive to light, neck supple Cardiovascular: Normal rate, regular rhythm and normal heart sounds.  No murmur heard. No BLE edema. Pulmonary/Chest: Effort normal and breath sounds normal. No respiratory distress. Abdominal: Soft.  There is no tenderness. Psychiatric: Patient has a normal mood and affect. behavior is normal. Judgment and  thought content normal.   Recent Results (from the past 2160 hours)  Hemoglobin A1c     Status: None   Collection Time: 01/24/24 12:00 AM  Result Value Ref Range   Hemoglobin A1C 7.0   HM DIABETES FOOT EXAM     Status: None   Collection Time: 01/24/24 12:00 AM  Result Value Ref Range   HM Diabetic Foot Exam See report     Diabetic Foot Exam:     PHQ2/9:    03/12/2024    2:43 PM 09/12/2023    2:36 PM 07/20/2023    2:09 PM 05/13/2023    3:29 PM 04/11/2023    2:51 PM  Depression screen PHQ 2/9  Decreased Interest 0 0 0 0 0  Down, Depressed, Hopeless 0 0 0 0 0  PHQ - 2 Score 0 0 0 0 0  Altered sleeping 0 0 0 0 0  Tired, decreased energy 0 0 0 0 0  Change in appetite 0 0 0 0 0  Feeling bad or failure about yourself  0 0 0 0 0  Trouble concentrating 0 0 0 0 0  Moving slowly or fidgety/restless 0 0 0 0 0  Suicidal thoughts 0 0 0 0 0  PHQ-9 Score 0 0 0 0 0  Difficult doing work/chores Not difficult at all  Not difficult at all      phq 9 is negative  Fall Risk:    09/12/2023    2:36 PM 07/20/2023    2:08 PM 05/31/2023    2:18 PM 05/13/2023    3:29 PM 04/11/2023    2:51 PM  Fall Risk   Falls in the past year? 0 0 0 0 0  Number falls in past yr:  0  0 0  Injury with Fall?  0  0 0  Risk for fall due to : No Fall Risks No Fall Risks Orthopedic patient No Fall Risks No Fall Risks  Follow up Falls prevention  discussed Falls prevention discussed;Education provided;Falls evaluation completed Falls prevention discussed;Education provided;Falls evaluation completed Falls prevention discussed Falls prevention discussed     Assessment & Plan  1. Hypertension associated with type 2 diabetes mellitus (HCC) (Primary)  Adjusting dose of bystolic to 20 mg BID  2. Morbid obesity (HCC)  Discussed with the patient the risk posed by an increased BMI. Discussed importance of portion control, calorie counting and at least 150 minutes of physical activity weekly. Avoid sweet beverages and drink more water. Eat at least 6 servings of fruit and vegetables daily     3. Hyperlipidemia LDL goal <70  - rosuvastatin (CRESTOR) 40 MG tablet; Take 1 tablet (40 mg total) by mouth daily.  Dispense: 90 tablet; Refill: 1  4. ED (erectile dysfunction) of organic origin  - tadalafil (CIALIS) 20 MG tablet; Take 1 tablet (20 mg total) by mouth every three (3) days as needed for erectile dysfunction.  Dispense: 90 tablet; Refill: 0  5. Hypertension goal BP (blood pressure) < 130/80  - Nebivolol HCl 20 MG TABS; Take 1 tablet (20 mg total) by mouth 2 (two) times daily. In place of metoprolol  Dispense: 180 tablet; Refill: 1 - olmesartan-hydrochlorothiazide (BENICAR HCT) 40-25 MG tablet; Take 1 tablet by mouth daily.  Dispense: 90 tablet; Refill: 0  6. Need for pneumococcal 20-valent conjugate vaccination  - Pneumococcal conjugate vaccine 20-valent (Prevnar 20)  7. OSA on CPAP  Continue CPAP use  8. Shift work sleep disorder  - modafinil (PROVIGIL) 200 MG tablet;  Take 1 tablet (200 mg total) by mouth daily.  Dispense: 90 tablet; Refill: 1  9. BPH without urinary obstruction

## 2024-03-20 ENCOUNTER — Other Ambulatory Visit: Payer: Self-pay | Admitting: Family Medicine

## 2024-03-20 DIAGNOSIS — M25562 Pain in left knee: Secondary | ICD-10-CM

## 2024-03-20 DIAGNOSIS — I10 Essential (primary) hypertension: Secondary | ICD-10-CM

## 2024-03-20 NOTE — Telephone Encounter (Signed)
 Pt has an appt for CPE in May and a 6 mth fu in September

## 2024-03-21 NOTE — Telephone Encounter (Signed)
 Pt states he is no longer in need for this medication

## 2024-04-08 ENCOUNTER — Other Ambulatory Visit: Payer: Self-pay | Admitting: Family Medicine

## 2024-04-08 DIAGNOSIS — I10 Essential (primary) hypertension: Secondary | ICD-10-CM

## 2024-04-20 LAB — MICROALBUMIN / CREATININE URINE RATIO: Microalb Creat Ratio: 29.7

## 2024-05-04 ENCOUNTER — Encounter (INDEPENDENT_AMBULATORY_CARE_PROVIDER_SITE_OTHER): Payer: BC Managed Care – PPO | Admitting: Ophthalmology

## 2024-05-04 DIAGNOSIS — E113392 Type 2 diabetes mellitus with moderate nonproliferative diabetic retinopathy without macular edema, left eye: Secondary | ICD-10-CM

## 2024-05-04 DIAGNOSIS — Z7985 Long-term (current) use of injectable non-insulin antidiabetic drugs: Secondary | ICD-10-CM | POA: Diagnosis not present

## 2024-05-04 DIAGNOSIS — E113311 Type 2 diabetes mellitus with moderate nonproliferative diabetic retinopathy with macular edema, right eye: Secondary | ICD-10-CM

## 2024-05-04 DIAGNOSIS — H35033 Hypertensive retinopathy, bilateral: Secondary | ICD-10-CM

## 2024-05-04 DIAGNOSIS — Z7984 Long term (current) use of oral hypoglycemic drugs: Secondary | ICD-10-CM

## 2024-05-04 DIAGNOSIS — H43813 Vitreous degeneration, bilateral: Secondary | ICD-10-CM

## 2024-05-04 DIAGNOSIS — I10 Essential (primary) hypertension: Secondary | ICD-10-CM

## 2024-05-04 LAB — HM DIABETES EYE EXAM

## 2024-05-09 ENCOUNTER — Encounter (INDEPENDENT_AMBULATORY_CARE_PROVIDER_SITE_OTHER): Admitting: Ophthalmology

## 2024-05-09 DIAGNOSIS — Z794 Long term (current) use of insulin: Secondary | ICD-10-CM

## 2024-05-09 DIAGNOSIS — E113311 Type 2 diabetes mellitus with moderate nonproliferative diabetic retinopathy with macular edema, right eye: Secondary | ICD-10-CM

## 2024-05-09 DIAGNOSIS — Z7984 Long term (current) use of oral hypoglycemic drugs: Secondary | ICD-10-CM | POA: Diagnosis not present

## 2024-05-16 ENCOUNTER — Encounter (INDEPENDENT_AMBULATORY_CARE_PROVIDER_SITE_OTHER): Admitting: Ophthalmology

## 2024-05-18 ENCOUNTER — Encounter: Payer: Self-pay | Admitting: Family Medicine

## 2024-05-23 ENCOUNTER — Other Ambulatory Visit: Payer: Self-pay | Admitting: Family Medicine

## 2024-05-23 DIAGNOSIS — H9203 Otalgia, bilateral: Secondary | ICD-10-CM

## 2024-06-09 ENCOUNTER — Other Ambulatory Visit: Payer: Self-pay | Admitting: Family Medicine

## 2024-06-09 DIAGNOSIS — I10 Essential (primary) hypertension: Secondary | ICD-10-CM

## 2024-06-21 ENCOUNTER — Other Ambulatory Visit: Payer: Self-pay | Admitting: Family Medicine

## 2024-06-21 DIAGNOSIS — M25562 Pain in left knee: Secondary | ICD-10-CM

## 2024-07-13 ENCOUNTER — Other Ambulatory Visit: Payer: Self-pay | Admitting: Family Medicine

## 2024-07-13 DIAGNOSIS — I10 Essential (primary) hypertension: Secondary | ICD-10-CM

## 2024-07-16 ENCOUNTER — Other Ambulatory Visit: Payer: Self-pay | Admitting: Family Medicine

## 2024-07-16 DIAGNOSIS — M25562 Pain in left knee: Secondary | ICD-10-CM

## 2024-07-16 DIAGNOSIS — I10 Essential (primary) hypertension: Secondary | ICD-10-CM

## 2024-07-16 DIAGNOSIS — E785 Hyperlipidemia, unspecified: Secondary | ICD-10-CM

## 2024-08-24 LAB — HEMOGLOBIN A1C: Hemoglobin A1C: 7.5

## 2024-09-07 ENCOUNTER — Encounter (INDEPENDENT_AMBULATORY_CARE_PROVIDER_SITE_OTHER): Admitting: Ophthalmology

## 2024-09-07 ENCOUNTER — Encounter (INDEPENDENT_AMBULATORY_CARE_PROVIDER_SITE_OTHER): Payer: Self-pay

## 2024-09-10 ENCOUNTER — Other Ambulatory Visit: Payer: Self-pay | Admitting: Family Medicine

## 2024-09-10 DIAGNOSIS — G4726 Circadian rhythm sleep disorder, shift work type: Secondary | ICD-10-CM

## 2024-09-12 ENCOUNTER — Ambulatory Visit: Payer: BC Managed Care – PPO | Admitting: Family Medicine

## 2024-09-12 ENCOUNTER — Ambulatory Visit: Admitting: Family Medicine

## 2024-09-12 ENCOUNTER — Encounter: Payer: Self-pay | Admitting: Family Medicine

## 2024-09-12 VITALS — BP 132/78 | HR 86 | Resp 16 | Ht 70.0 in | Wt 287.8 lb

## 2024-09-12 DIAGNOSIS — G4733 Obstructive sleep apnea (adult) (pediatric): Secondary | ICD-10-CM

## 2024-09-12 DIAGNOSIS — I152 Hypertension secondary to endocrine disorders: Secondary | ICD-10-CM

## 2024-09-12 DIAGNOSIS — G4726 Circadian rhythm sleep disorder, shift work type: Secondary | ICD-10-CM | POA: Diagnosis not present

## 2024-09-12 DIAGNOSIS — Z79899 Other long term (current) drug therapy: Secondary | ICD-10-CM

## 2024-09-12 DIAGNOSIS — N529 Male erectile dysfunction, unspecified: Secondary | ICD-10-CM

## 2024-09-12 DIAGNOSIS — Z7984 Long term (current) use of oral hypoglycemic drugs: Secondary | ICD-10-CM

## 2024-09-12 DIAGNOSIS — E1159 Type 2 diabetes mellitus with other circulatory complications: Secondary | ICD-10-CM | POA: Diagnosis not present

## 2024-09-12 DIAGNOSIS — E785 Hyperlipidemia, unspecified: Secondary | ICD-10-CM

## 2024-09-12 MED ORDER — OLMESARTAN MEDOXOMIL-HCTZ 40-25 MG PO TABS: 1.0000 | ORAL_TABLET | Freq: Every day | ORAL | 1 refills | Status: AC

## 2024-09-12 MED ORDER — MODAFINIL 200 MG PO TABS: 200.0000 mg | ORAL_TABLET | Freq: Every day | ORAL | 0 refills | Status: AC

## 2024-09-12 MED ORDER — ROSUVASTATIN CALCIUM 40 MG PO TABS: 40.0000 mg | ORAL_TABLET | Freq: Every day | ORAL | 1 refills | Status: AC

## 2024-09-12 MED ORDER — TADALAFIL 20 MG PO TABS: 20.0000 mg | ORAL_TABLET | ORAL | 0 refills | Status: AC | PRN

## 2024-09-12 NOTE — Progress Notes (Signed)
 Name: Clarence Black   MRN: [id number]    DOB: 05-27-1966   Date:09/12/2024       Progress Note  Subjective  Chief Complaint  Chief Complaint  Patient presents with   Medical Management of Chronic Issues   Discussed the use of AI scribe software for clinical note transcription with the patient, who gave verbal consent to proceed.  History of Present Illness Clarence Black is a 58 year old male who presents for a routine follow-up.  His last A1c was 7.5% as of August 29. He is taking Ozempic 2 mg weekly and plans to switch to Mounjaro 10 mg. No side effects from medications and consistent weekly use of Ozempic. He is also on Xigduo (Farxiga plus metformin ) twice daily and glipizide  10 mg daily. No hypoglycemic episodes or symptoms like excessive hunger, thirst, or frequent urination. He uses a Dexcom 7 sensor for glucose monitoring.  He has hyperlipidemia and takes rosuvastatin  40 mg daily. His last lipid panel in April showed an LDL level of 41 mg/dL.  He has hypertension, managed with Benicar  HCTZ 40/12.5 mg and nebivolol  20 mg daily. Home blood pressure readings range from 130s to 140s systolic. No chest pain or palpitations.  He has obesity, with a current weight of 287 lbs, down from 292 lbs in March and 295 lbs a year ago.  He has sleep apnea and uses a CPAP machine most nights. He takes Provigil  (modafinil ) as needed for daytime alertness. No significant difference in alertness with or without CPAP use.  He takes Cialis  20 mg as needed for erectile dysfunction.  No current knee pain and he has discontinued meloxicam , which was previously prescribed for left knee pain.  He takes Xyzal  (levocetirizine) as needed for allergies and denies current symptoms.  He has a family history of heart disease, with his brother dying at age 42 and other family members having diabetes, hypertension, and obesity.  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) 08/06/2019   Vitamin D  deficiency 01/29/2019   OSA on CPAP 09/29/2018   Diastasis recti 12/31/2016   Low testosterone  in male 10/22/2016   Hyperlipidemia LDL goal <70 06/13/2015   Hypertension goal BP (blood pressure) < 130/80 06/13/2015   ED (erectile dysfunction) of organic origin 02/20/2015   Obesity, Class III, BMI 40-49.9 (morbid obesity) (HCC) 02/20/2015    Past Surgical History:  Procedure Laterality Date   COLONOSCOPY  2013   COLONOSCOPY WITH PROPOFOL  N/A 06/02/2018   Procedure: COLONOSCOPY WITH PROPOFOL ;  Surgeon: Therisa Bi, MD;  Location: [facility] ENDOSCOPY;  Service: Gastroenterology;  Laterality: N/A;    Family History  Problem Relation Age of Onset   Diabetes Mother    Kidney disease Mother    Diabetes Brother    Diabetes Brother    Diabetes Maternal Grandfather    Hypertension Maternal Grandfather    Stroke Maternal Grandfather     Social History   Tobacco Use   Smoking status: Never   Smokeless tobacco: Never  Substance Use Topics   Alcohol use: No    Alcohol/week: 0.0  standard drinks of alcohol     Current Outpatient Medications:    BD PEN NEEDLE NANO 2ND GEN 32G X 4 MM MISC, See admin instructions., Disp: , Rfl:    brimonidine (ALPHAGAN) 0.15 % ophthalmic solution, Place 1 drop into the right eye daily., Disp: , Rfl:    Continuous Blood Gluc Sensor (FREESTYLE LIBRE 3 SENSOR) MISC, USE 1 EACH EVERY 14 (FOURTEEN) DAYS, Disp: , Rfl:    Dapagliflozin-metFORMIN  HCl ER (XIGDUO XR) 04-999 MG TB24, Take 2 tablets by mouth daily., Disp: , Rfl:     glipiZIDE  (GLUCOTROL  XL) 10 MG 24 hr tablet, Take 1 tablet by mouth daily., Disp: , Rfl:    glucose blood (ONE TOUCH ULTRA TEST) test strip, Use as instructed, Disp: 300 each, Rfl: 3   hydrALAZINE  (APRESOLINE ) 10 MG tablet, TAKE 1 TABLET (10 MG TOTAL) BY MOUTH DAILY AS NEEDED WHEN BLOOD PRESSURE ABOVE 145/90, Disp: 90 tablet, Rfl: 0   levocetirizine (XYZAL ) 5 MG tablet, TAKE 1 TABLET BY MOUTH EVERY DAY IN THE EVENING, Disp: 90 tablet, Rfl: 1   meloxicam  (MOBIC ) 15 MG tablet, TAKE 1 TABLET (15 MG TOTAL) BY MOUTH DAILY., Disp: 30 tablet, Rfl: 0   modafinil  (PROVIGIL ) 200 MG tablet, Take 1 tablet (200 mg total) by mouth daily., Disp: 90 tablet, Rfl: 1   Nebivolol  HCl 20 MG TABS, Take 1 tablet (20 mg total) by mouth 2 (two) times daily. In place of metoprolol , Disp: 180 tablet, Rfl: 1   olmesartan -hydrochlorothiazide  (BENICAR  HCT) 40-25 MG tablet, TAKE 1 TABLET BY MOUTH EVERY DAY, Disp: 90 tablet, Rfl: 0   ONETOUCH DELICA LANCETS FINE MISC, 1 Device by Does not apply route 3 (three) times daily., Disp: 300 each, Rfl: 3   OZEMPIC, 2 MG/DOSE, 8 MG/3ML SOPN, Inject 2 mg into the skin once a week., Disp: , Rfl:    rosuvastatin  (CRESTOR ) 40 MG tablet, Take 1 tablet (40 mg total) by mouth daily., Disp: 90 tablet, Rfl: 1   tadalafil  (CIALIS ) 20 MG tablet, Take 1 tablet (20 mg total) by mouth every three (3) days as needed for erectile dysfunction., Disp: 90 tablet, Rfl: 0   MOUNJARO 10 MG/0.5ML Pen, Inject 10 mg into the skin once a week. (Patient not taking: Reported on 09/12/2024), Disp: , Rfl:   No Known Allergies  I personally reviewed active problem list, medication list, allergies, family history with the patient/caregiver today.   ROS  Ten systems reviewed and is negative except as mentioned in HPI    Objective Physical Exam  CONSTITUTIONAL: Patient appears well-developed and well-nourished. No distress. HEENT: Head atraumatic, normocephalic, neck supple. CARDIOVASCULAR: Normal rate, regular  rhythm and normal heart sounds. No murmur heard. No BLE edema. PULMONARY: Effort normal and breath sounds normal. Lungs clear to auscultation bilaterally. No respiratory distress. MUSCULOSKELETAL: Normal gait. Without gross motor or sensory deficit. PSYCHIATRIC: Patient has a normal mood and affect. Behavior is normal. Judgment and thought content normal.  Vitals:   09/12/24 1416  BP: 132/78  Pulse: 86  Resp: 16  SpO2: 98%  Weight: 287 lb 12.8 oz (130.5 kg)  Height: 5' 10 (1.778 m)    Body mass index is 41.3 kg/m.  Recent Results (from the past 2160 hours)  Hemoglobin A1c     Status: None   Collection Time: 08/24/24 12:00 AM  Result Value Ref Range   Hemoglobin A1C 7.5      PHQ2/9:    09/12/2024    2:11 PM 03/12/2024    2:43 PM  09/12/2023    2:36 PM 07/20/2023    2:09 PM 05/13/2023    3:29 PM  Depression screen PHQ 2/9  Decreased Interest 0 0 0 0 0  Down, Depressed, Hopeless 0 0 0 0 0  PHQ - 2 Score 0 0 0 0 0  Altered sleeping  0 0 0 0  Tired, decreased energy  0 0 0 0  Change in appetite  0 0 0 0  Feeling bad or failure about yourself   0 0 0 0  Trouble concentrating  0 0 0 0  Moving slowly or fidgety/restless  0 0 0 0  Suicidal thoughts  0 0 0 0  PHQ-9 Score  0 0 0 0  Difficult doing work/chores  Not difficult at all  Not difficult at all     phq 9 is negative  Fall Risk:    09/12/2024    2:11 PM 09/12/2023    2:36 PM 07/20/2023    2:08 PM 05/31/2023    2:18 PM 05/13/2023    3:29 PM  Fall Risk   Falls in the past year? 0 0 0 0 0  Number falls in past yr: 0  0  0  Injury with Fall? 0  0  0  Risk for fall due to : No Fall Risks No Fall Risks No Fall Risks Orthopedic patient No Fall Risks  Follow up Falls evaluation completed Falls prevention discussed Falls prevention discussed;Education provided;Falls evaluation completed Falls prevention discussed;Education provided;Falls evaluation completed Falls prevention discussed     Assessment & Plan Type 2 diabetes  mellitus with HTN, dyslipidemia A1c 7.5%, suboptimal control. Transitioning from Ozempic to Mounjaro for improved glycemic control and weight management. No hypoglycemia or significant medication side effects. Uses Dexcom 7 for monitoring. - Discontinue Ozempic. - Initiate Mounjaro 10 mg as directed by Endo  - Check B12 levels. - Order blood tests for sugar, kidney, and liver function. - Continue Xigduo and glipizide . - Continue using Dexcom 7 for glucose monitoring.  Hypertension Home readings 130s-140s, slightly above target. Managed with Benicar  HCTZ and nebivolol . Weight loss from Mounjaro may improve control. - Continue VeniCard HCTZ and nebivolol . - Encourage weight loss with Mounjaro. - Advise reduction of caffeine and salt intake.  Morbid obesity Weight decreased from 295 lbs to 287 lbs. Mounjaro expected to aid further weight loss and improve health outcomes. - Initiate Mounjaro 10 mg for weight loss.  Obstructive sleep apnea Managed with CPAP. No significant difference in alertness with or without CPAP. Uses modafinil  as needed. - Continue modafinil  as needed.  Hyperlipidemia Well-controlled with rosuvastatin , LDL 41 mg/dL. No recent lipid panel needed. - Continue rosuvastatin  40 mg.  Erectile dysfunction Managed with Cialis  20 mg as needed. - Continue Cialis  20 mg as needed.  Allergic rhinitis Managed with Xyzal  as needed for symptoms. - Continue Xyzal  as needed.  General Health Maintenance Up to date on vaccinations. Vitamin D  supplementation ongoing. Family history of heart disease noted. - Ensure vaccinations are up to date. - Continue vitamin D  supplementation.

## 2024-09-13 ENCOUNTER — Ambulatory Visit: Payer: Self-pay | Admitting: Family Medicine

## 2024-09-13 LAB — CBC WITH DIFFERENTIAL/PLATELET
Absolute Lymphocytes: 2716 {cells}/uL (ref 850–3900)
Absolute Monocytes: 635 {cells}/uL (ref 200–950)
Basophils Absolute: 29 {cells}/uL (ref 0–200)
Basophils Relative: 0.4 %
Eosinophils Absolute: 197 {cells}/uL (ref 15–500)
Eosinophils Relative: 2.7 %
HCT: 45.7 % (ref 38.5–50.0)
Hemoglobin: 14.6 g/dL (ref 13.2–17.1)
MCH: 27.5 pg (ref 27.0–33.0)
MCHC: 31.9 g/dL — ABNORMAL LOW (ref 32.0–36.0)
MCV: 86.1 fL (ref 80.0–100.0)
MPV: 12 fL (ref 7.5–12.5)
Monocytes Relative: 8.7 %
Neutro Abs: 3723 {cells}/uL (ref 1500–7800)
Neutrophils Relative %: 51 %
Platelets: 178 Thousand/uL (ref 140–400)
RBC: 5.31 Million/uL (ref 4.20–5.80)
RDW: 14.2 % (ref 11.0–15.0)
Total Lymphocyte: 37.2 %
WBC: 7.3 Thousand/uL (ref 3.8–10.8)

## 2024-09-13 LAB — VITAMIN B12: Vitamin B-12: 496 pg/mL (ref 200–1100)

## 2024-09-13 LAB — COMPREHENSIVE METABOLIC PANEL WITH GFR
AG Ratio: 2.1 (calc) (ref 1.0–2.5)
ALT: 19 U/L (ref 9–46)
AST: 20 U/L (ref 10–35)
Albumin: 4.6 g/dL (ref 3.6–5.1)
Alkaline phosphatase (APISO): 55 U/L (ref 35–144)
BUN: 20 mg/dL (ref 7–25)
CO2: 29 mmol/L (ref 20–32)
Calcium: 9.4 mg/dL (ref 8.6–10.3)
Chloride: 102 mmol/L (ref 98–110)
Creat: 1 mg/dL (ref 0.70–1.30)
Globulin: 2.2 g/dL (ref 1.9–3.7)
Glucose, Bld: 115 mg/dL — ABNORMAL HIGH (ref 65–99)
Potassium: 3.6 mmol/L (ref 3.5–5.3)
Sodium: 138 mmol/L (ref 135–146)
Total Bilirubin: 0.9 mg/dL (ref 0.2–1.2)
Total Protein: 6.8 g/dL (ref 6.1–8.1)
eGFR: 88 mL/min/1.73m2 (ref >=60)

## 2024-09-23 ENCOUNTER — Other Ambulatory Visit: Payer: Self-pay | Admitting: Family Medicine

## 2024-09-23 DIAGNOSIS — M25562 Pain in left knee: Secondary | ICD-10-CM

## 2024-09-23 DIAGNOSIS — I10 Essential (primary) hypertension: Secondary | ICD-10-CM

## 2024-09-23 DIAGNOSIS — H9203 Otalgia, bilateral: Secondary | ICD-10-CM

## 2024-11-30 ENCOUNTER — Encounter (INDEPENDENT_AMBULATORY_CARE_PROVIDER_SITE_OTHER): Admitting: Ophthalmology

## 2024-11-30 DIAGNOSIS — H43813 Vitreous degeneration, bilateral: Secondary | ICD-10-CM

## 2024-11-30 DIAGNOSIS — E113392 Type 2 diabetes mellitus with moderate nonproliferative diabetic retinopathy without macular edema, left eye: Secondary | ICD-10-CM

## 2024-11-30 DIAGNOSIS — Z7985 Long-term (current) use of injectable non-insulin antidiabetic drugs: Secondary | ICD-10-CM | POA: Diagnosis not present

## 2024-11-30 DIAGNOSIS — H35033 Hypertensive retinopathy, bilateral: Secondary | ICD-10-CM

## 2024-11-30 DIAGNOSIS — E113311 Type 2 diabetes mellitus with moderate nonproliferative diabetic retinopathy with macular edema, right eye: Secondary | ICD-10-CM

## 2024-11-30 DIAGNOSIS — Z7984 Long term (current) use of oral hypoglycemic drugs: Secondary | ICD-10-CM | POA: Diagnosis not present

## 2024-11-30 DIAGNOSIS — I10 Essential (primary) hypertension: Secondary | ICD-10-CM

## 2024-12-11 NOTE — Patient Instructions (Incomplete)

## 2024-12-12 ENCOUNTER — Encounter: Admitting: Family Medicine

## 2025-01-16 NOTE — Patient Instructions (Incomplete)

## 2025-01-18 ENCOUNTER — Encounter: Admitting: Family Medicine

## 2025-01-25 ENCOUNTER — Encounter (INDEPENDENT_AMBULATORY_CARE_PROVIDER_SITE_OTHER): Admitting: Ophthalmology

## 2025-01-25 DIAGNOSIS — H43813 Vitreous degeneration, bilateral: Secondary | ICD-10-CM

## 2025-01-25 DIAGNOSIS — I10 Essential (primary) hypertension: Secondary | ICD-10-CM

## 2025-01-25 DIAGNOSIS — Z7985 Long-term (current) use of injectable non-insulin antidiabetic drugs: Secondary | ICD-10-CM | POA: Diagnosis not present

## 2025-01-25 DIAGNOSIS — E113392 Type 2 diabetes mellitus with moderate nonproliferative diabetic retinopathy without macular edema, left eye: Secondary | ICD-10-CM

## 2025-01-25 DIAGNOSIS — H35033 Hypertensive retinopathy, bilateral: Secondary | ICD-10-CM | POA: Diagnosis not present

## 2025-01-25 DIAGNOSIS — Z7984 Long term (current) use of oral hypoglycemic drugs: Secondary | ICD-10-CM

## 2025-01-25 DIAGNOSIS — E113311 Type 2 diabetes mellitus with moderate nonproliferative diabetic retinopathy with macular edema, right eye: Secondary | ICD-10-CM | POA: Diagnosis not present

## 2025-02-11 ENCOUNTER — Encounter: Admitting: Family Medicine

## 2025-03-13 ENCOUNTER — Ambulatory Visit: Admitting: Family Medicine

## 2025-03-25 ENCOUNTER — Encounter (INDEPENDENT_AMBULATORY_CARE_PROVIDER_SITE_OTHER): Admitting: Ophthalmology
